# Patient Record
Sex: Female | Born: 1940 | Race: White | Hispanic: No | Smoking: Never smoker
Health system: Southern US, Community
[De-identification: ages and names within clinical notes are randomized; demographics above are authoritative.]

## PROBLEM LIST (undated history)

## (undated) DIAGNOSIS — R351 Nocturia: Secondary | ICD-10-CM

## (undated) DIAGNOSIS — D649 Anemia, unspecified: Secondary | ICD-10-CM

## (undated) DIAGNOSIS — M5136 Other intervertebral disc degeneration, lumbar region: Secondary | ICD-10-CM

## (undated) DIAGNOSIS — R300 Dysuria: Secondary | ICD-10-CM

## (undated) DIAGNOSIS — M549 Dorsalgia, unspecified: Secondary | ICD-10-CM

## (undated) DIAGNOSIS — R296 Repeated falls: Secondary | ICD-10-CM

## (undated) DIAGNOSIS — E119 Type 2 diabetes mellitus without complications: Secondary | ICD-10-CM

## (undated) DIAGNOSIS — M1990 Unspecified osteoarthritis, unspecified site: Secondary | ICD-10-CM

## (undated) DIAGNOSIS — M810 Age-related osteoporosis without current pathological fracture: Secondary | ICD-10-CM

## (undated) DIAGNOSIS — R002 Palpitations: Secondary | ICD-10-CM

## (undated) DIAGNOSIS — E785 Hyperlipidemia, unspecified: Secondary | ICD-10-CM

## (undated) DIAGNOSIS — M47816 Spondylosis without myelopathy or radiculopathy, lumbar region: Secondary | ICD-10-CM

## (undated) DIAGNOSIS — N390 Urinary tract infection, site not specified: Secondary | ICD-10-CM

## (undated) DIAGNOSIS — J189 Pneumonia, unspecified organism: Secondary | ICD-10-CM

## (undated) DIAGNOSIS — G9340 Encephalopathy, unspecified: Secondary | ICD-10-CM

## (undated) DIAGNOSIS — H547 Unspecified visual loss: Secondary | ICD-10-CM

## (undated) DIAGNOSIS — G20 Parkinson's disease: Secondary | ICD-10-CM

## (undated) DIAGNOSIS — I10 Essential (primary) hypertension: Secondary | ICD-10-CM

## (undated) DIAGNOSIS — G8929 Other chronic pain: Secondary | ICD-10-CM

## (undated) DIAGNOSIS — I959 Hypotension, unspecified: Secondary | ICD-10-CM

## (undated) DIAGNOSIS — H3552 Pigmentary retinal dystrophy: Secondary | ICD-10-CM

## (undated) DIAGNOSIS — K219 Gastro-esophageal reflux disease without esophagitis: Secondary | ICD-10-CM

## (undated) DIAGNOSIS — Z8719 Personal history of other diseases of the digestive system: Secondary | ICD-10-CM

## (undated) DIAGNOSIS — G20A1 Parkinson's disease without dyskinesia, without mention of fluctuations: Secondary | ICD-10-CM

## (undated) DIAGNOSIS — H9190 Unspecified hearing loss, unspecified ear: Secondary | ICD-10-CM

## (undated) DIAGNOSIS — F419 Anxiety disorder, unspecified: Secondary | ICD-10-CM

## (undated) DIAGNOSIS — N179 Acute kidney failure, unspecified: Secondary | ICD-10-CM

## (undated) DIAGNOSIS — F0390 Unspecified dementia without behavioral disturbance: Secondary | ICD-10-CM

## (undated) DIAGNOSIS — R52 Pain, unspecified: Secondary | ICD-10-CM

## (undated) HISTORY — DX: Dysuria: R30.0

## (undated) HISTORY — DX: Dorsalgia, unspecified: M54.9

## (undated) HISTORY — DX: Nocturia: R35.1

## (undated) HISTORY — DX: Other chronic pain: G89.29

## (undated) HISTORY — DX: Type 2 diabetes mellitus without complications: E11.9

## (undated) HISTORY — DX: Essential (primary) hypertension: I10

## (undated) HISTORY — DX: Repeated falls: R29.6

## (undated) HISTORY — PX: HIATAL HERNIA REPAIR: SHX195

## (undated) HISTORY — DX: Spondylosis without myelopathy or radiculopathy, lumbar region: M47.816

## (undated) HISTORY — DX: Other intervertebral disc degeneration, lumbar region: M51.36

## (undated) HISTORY — DX: Acute kidney failure, unspecified: N17.9

## (undated) HISTORY — DX: Gastro-esophageal reflux disease without esophagitis: K21.9

## (undated) HISTORY — DX: Encephalopathy, unspecified: G93.40

## (undated) HISTORY — DX: Palpitations: R00.2

## (undated) HISTORY — PX: OTHER SURGICAL HISTORY: SHX169

## (undated) HISTORY — DX: Hypotension, unspecified: I95.9

## (undated) HISTORY — DX: Pneumonia, unspecified organism: J18.9

## (undated) HISTORY — PX: TOE AMPUTATION: SHX809

## (undated) HISTORY — DX: Urinary tract infection, site not specified: N39.0

---

## 2001-07-11 ENCOUNTER — Encounter: Payer: Self-pay | Admitting: Neurosurgery

## 2001-07-14 ENCOUNTER — Encounter: Payer: Self-pay | Admitting: Neurosurgery

## 2001-07-14 ENCOUNTER — Inpatient Hospital Stay (HOSPITAL_COMMUNITY): Admission: RE | Admit: 2001-07-14 | Discharge: 2001-07-15 | Payer: Self-pay | Admitting: Neurosurgery

## 2004-12-14 ENCOUNTER — Ambulatory Visit: Payer: Self-pay | Admitting: Unknown Physician Specialty

## 2005-04-30 ENCOUNTER — Ambulatory Visit: Payer: Self-pay | Admitting: Cardiology

## 2005-11-05 ENCOUNTER — Ambulatory Visit: Payer: Self-pay | Admitting: Unknown Physician Specialty

## 2007-04-19 ENCOUNTER — Ambulatory Visit: Payer: Self-pay | Admitting: Unknown Physician Specialty

## 2007-09-08 ENCOUNTER — Ambulatory Visit: Payer: Self-pay | Admitting: Gastroenterology

## 2008-08-15 ENCOUNTER — Ambulatory Visit: Payer: Self-pay | Admitting: Unknown Physician Specialty

## 2009-09-08 ENCOUNTER — Ambulatory Visit: Payer: Self-pay | Admitting: Unknown Physician Specialty

## 2009-09-18 ENCOUNTER — Ambulatory Visit: Payer: Self-pay | Admitting: Unknown Physician Specialty

## 2010-04-27 ENCOUNTER — Ambulatory Visit: Payer: Self-pay | Admitting: Podiatry

## 2010-05-01 ENCOUNTER — Ambulatory Visit: Payer: Self-pay | Admitting: Podiatry

## 2010-05-04 LAB — PATHOLOGY REPORT

## 2010-10-06 ENCOUNTER — Ambulatory Visit: Payer: Self-pay | Admitting: Unknown Physician Specialty

## 2010-10-13 ENCOUNTER — Ambulatory Visit: Payer: Self-pay | Admitting: Unknown Physician Specialty

## 2010-10-18 ENCOUNTER — Encounter: Payer: Self-pay | Admitting: Unknown Physician Specialty

## 2010-10-29 ENCOUNTER — Ambulatory Visit: Payer: Self-pay | Admitting: Unknown Physician Specialty

## 2010-10-30 ENCOUNTER — Ambulatory Visit: Payer: Self-pay | Admitting: Unknown Physician Specialty

## 2011-04-22 ENCOUNTER — Ambulatory Visit: Payer: Self-pay | Admitting: Podiatry

## 2011-10-22 ENCOUNTER — Ambulatory Visit: Payer: Self-pay | Admitting: Cardiology

## 2012-02-18 ENCOUNTER — Ambulatory Visit: Payer: Self-pay | Admitting: Unknown Physician Specialty

## 2012-03-01 ENCOUNTER — Ambulatory Visit: Payer: Self-pay | Admitting: Unknown Physician Specialty

## 2012-04-20 ENCOUNTER — Ambulatory Visit: Payer: Self-pay | Admitting: Gastroenterology

## 2012-06-20 ENCOUNTER — Emergency Department: Payer: Self-pay | Admitting: *Deleted

## 2012-06-20 LAB — URINALYSIS, COMPLETE
Bacteria: NONE SEEN
Bilirubin,UR: NEGATIVE
Blood: NEGATIVE
Glucose,UR: >=500 mg/dL (ref 0–75)
Ketone: NEGATIVE
Leukocyte Esterase: NEGATIVE
Nitrite: NEGATIVE
Ph: 8 (ref 4.5–8.0)
Protein: NEGATIVE
RBC,UR: 1 /HPF (ref 0–5)
Specific Gravity: 1.009 (ref 1.003–1.030)
Squamous Epithelial: 1
WBC UR: 1 /HPF (ref 0–5)

## 2012-06-20 LAB — BASIC METABOLIC PANEL
Anion Gap: 12 (ref 7–16)
BUN: 11 mg/dL (ref 7–18)
Calcium, Total: 9.4 mg/dL (ref 8.5–10.1)
Chloride: 103 mmol/L (ref 98–107)
Co2: 23 mmol/L (ref 21–32)
Creatinine: 0.86 mg/dL (ref 0.60–1.30)
EGFR (African American): >60
EGFR (Non-African Amer.): >60
Glucose: 194 mg/dL — ABNORMAL HIGH (ref 65–99)
Osmolality: 280 (ref 275–301)
Potassium: 3.6 mmol/L (ref 3.5–5.1)
Sodium: 138 mmol/L (ref 136–145)

## 2012-06-20 LAB — CBC WITH DIFFERENTIAL/PLATELET
Basophil #: 0 10*3/uL (ref 0.0–0.1)
Basophil %: 0.5 %
Eosinophil #: 0 10*3/uL (ref 0.0–0.7)
Eosinophil %: 0.1 %
HCT: 42 % (ref 35.0–47.0)
HGB: 14.1 g/dL (ref 12.0–16.0)
Lymphocyte #: 1.2 10*3/uL (ref 1.0–3.6)
Lymphocyte %: 15.8 %
MCH: 30.3 pg (ref 26.0–34.0)
MCHC: 33.6 g/dL (ref 32.0–36.0)
MCV: 90 fL (ref 80–100)
Monocyte #: 0.3 x10 3/mm (ref 0.2–0.9)
Monocyte %: 4.7 %
Neutrophil #: 5.8 10*3/uL (ref 1.4–6.5)
Neutrophil %: 78.9 %
Platelet: 162 10*3/uL (ref 150–440)
RBC: 4.66 10*6/uL (ref 3.80–5.20)
RDW: 13.1 % (ref 11.5–14.5)
WBC: 7.3 10*3/uL (ref 3.6–11.0)

## 2012-11-06 ENCOUNTER — Emergency Department: Payer: Self-pay | Admitting: Emergency Medicine

## 2012-11-06 LAB — CBC WITH DIFFERENTIAL/PLATELET
Basophil %: 0.9 %
Eosinophil #: 0.1 10*3/uL (ref 0.0–0.7)
Eosinophil %: 1.9 %
HCT: 40.2 % (ref 35.0–47.0)
HGB: 13.5 g/dL (ref 12.0–16.0)
Lymphocyte %: 15.7 %
Monocyte #: 0.6 x10 3/mm (ref 0.2–0.9)
Monocyte %: 8.2 %
Neutrophil #: 5.4 10*3/uL (ref 1.4–6.5)
Neutrophil %: 73.3 %
RBC: 4.29 10*6/uL (ref 3.80–5.20)
RDW: 13.6 % (ref 11.5–14.5)

## 2012-11-06 LAB — COMPREHENSIVE METABOLIC PANEL
Alkaline Phosphatase: 93 U/L (ref 50–136)
BUN: 12 mg/dL (ref 7–18)
Bilirubin,Total: 0.4 mg/dL (ref 0.2–1.0)
Co2: 26 mmol/L (ref 21–32)
Creatinine: 0.85 mg/dL (ref 0.60–1.30)
EGFR (Non-African Amer.): >60
SGOT(AST): 29 U/L (ref 15–37)
Sodium: 142 mmol/L (ref 136–145)
Total Protein: 7.7 g/dL (ref 6.4–8.2)

## 2012-11-06 LAB — CK TOTAL AND CKMB (NOT AT ARMC): CK, Total: 124 U/L (ref 21–215)

## 2012-11-06 LAB — TROPONIN I: Troponin-I: <0.02 ng/mL

## 2013-03-16 ENCOUNTER — Ambulatory Visit: Payer: Self-pay | Admitting: Unknown Physician Specialty

## 2013-08-15 ENCOUNTER — Other Ambulatory Visit: Payer: Self-pay | Admitting: Physical Medicine and Rehabilitation

## 2013-08-15 DIAGNOSIS — M545 Low back pain: Secondary | ICD-10-CM

## 2013-08-27 ENCOUNTER — Emergency Department: Payer: Self-pay | Admitting: Emergency Medicine

## 2013-08-27 LAB — CBC WITH DIFFERENTIAL/PLATELET
Basophil #: 0.1 10*3/uL (ref 0.0–0.1)
Basophil %: 1 %
Eosinophil #: 0.1 10*3/uL (ref 0.0–0.7)
Eosinophil %: 1.4 %
HCT: 40.5 % (ref 35.0–47.0)
HGB: 13.7 g/dL (ref 12.0–16.0)
Lymphocyte #: 1.5 10*3/uL (ref 1.0–3.6)
Lymphocyte %: 26.1 %
MCH: 32 pg (ref 26.0–34.0)
MCHC: 33.7 g/dL (ref 32.0–36.0)
MCV: 95 fL (ref 80–100)
Monocyte #: 0.4 x10 3/mm (ref 0.2–0.9)
Monocyte %: 7.5 %
Neutrophil #: 3.6 10*3/uL (ref 1.4–6.5)
Neutrophil %: 64 %
Platelet: 158 10*3/uL (ref 150–440)
RBC: 4.27 10*6/uL (ref 3.80–5.20)
RDW: 13.5 % (ref 11.5–14.5)
WBC: 5.6 10*3/uL (ref 3.6–11.0)

## 2013-08-27 LAB — URINALYSIS, COMPLETE
Bacteria: NONE SEEN
Blood: NEGATIVE
Glucose,UR: NEGATIVE mg/dL (ref 0–75)
Nitrite: NEGATIVE
Protein: NEGATIVE
RBC,UR: NONE SEEN /HPF (ref 0–5)
Specific Gravity: 1.008 (ref 1.003–1.030)
WBC UR: 3 /HPF (ref 0–5)

## 2013-08-27 LAB — BASIC METABOLIC PANEL
Anion Gap: 3 — ABNORMAL LOW (ref 7–16)
BUN: 14 mg/dL (ref 7–18)
Calcium, Total: 9.6 mg/dL (ref 8.5–10.1)
Chloride: 103 mmol/L (ref 98–107)
Co2: 29 mmol/L (ref 21–32)
Creatinine: 0.87 mg/dL (ref 0.60–1.30)
EGFR (African American): >60
EGFR (Non-African Amer.): >60
Glucose: 134 mg/dL — ABNORMAL HIGH (ref 65–99)
Osmolality: 273 (ref 275–301)
Potassium: 3.9 mmol/L (ref 3.5–5.1)
Sodium: 135 mmol/L — ABNORMAL LOW (ref 136–145)

## 2013-08-29 ENCOUNTER — Other Ambulatory Visit: Payer: Self-pay

## 2014-01-31 DIAGNOSIS — R351 Nocturia: Secondary | ICD-10-CM | POA: Insufficient documentation

## 2014-01-31 HISTORY — DX: Nocturia: R35.1

## 2014-05-23 DIAGNOSIS — R300 Dysuria: Secondary | ICD-10-CM

## 2014-05-23 HISTORY — DX: Dysuria: R30.0

## 2014-07-10 DIAGNOSIS — R002 Palpitations: Secondary | ICD-10-CM

## 2014-07-10 HISTORY — DX: Palpitations: R00.2

## 2014-08-15 DIAGNOSIS — M5116 Intervertebral disc disorders with radiculopathy, lumbar region: Secondary | ICD-10-CM | POA: Insufficient documentation

## 2014-08-15 DIAGNOSIS — M47816 Spondylosis without myelopathy or radiculopathy, lumbar region: Secondary | ICD-10-CM

## 2014-08-15 DIAGNOSIS — M25519 Pain in unspecified shoulder: Secondary | ICD-10-CM | POA: Insufficient documentation

## 2014-08-15 HISTORY — DX: Spondylosis without myelopathy or radiculopathy, lumbar region: M47.816

## 2014-10-15 DIAGNOSIS — M170 Bilateral primary osteoarthritis of knee: Secondary | ICD-10-CM | POA: Insufficient documentation

## 2014-11-28 DIAGNOSIS — G4750 Parasomnia, unspecified: Secondary | ICD-10-CM | POA: Insufficient documentation

## 2014-11-28 DIAGNOSIS — E119 Type 2 diabetes mellitus without complications: Secondary | ICD-10-CM

## 2014-11-28 HISTORY — DX: Type 2 diabetes mellitus without complications: E11.9

## 2015-03-08 ENCOUNTER — Encounter: Payer: Self-pay | Admitting: *Deleted

## 2015-03-08 ENCOUNTER — Emergency Department: Payer: Medicare Other

## 2015-03-08 ENCOUNTER — Inpatient Hospital Stay
Admission: EM | Admit: 2015-03-08 | Discharge: 2015-03-12 | DRG: 872 | Disposition: A | Discharge status: Home Health | Payer: Medicare Other | Attending: Internal Medicine | Admitting: Internal Medicine

## 2015-03-08 DIAGNOSIS — M810 Age-related osteoporosis without current pathological fracture: Secondary | ICD-10-CM | POA: Diagnosis present

## 2015-03-08 DIAGNOSIS — E119 Type 2 diabetes mellitus without complications: Secondary | ICD-10-CM | POA: Diagnosis present

## 2015-03-08 DIAGNOSIS — Z79899 Other long term (current) drug therapy: Secondary | ICD-10-CM

## 2015-03-08 DIAGNOSIS — I959 Hypotension, unspecified: Secondary | ICD-10-CM | POA: Diagnosis present

## 2015-03-08 DIAGNOSIS — G8929 Other chronic pain: Secondary | ICD-10-CM | POA: Diagnosis present

## 2015-03-08 DIAGNOSIS — H548 Legal blindness, as defined in USA: Secondary | ICD-10-CM | POA: Diagnosis present

## 2015-03-08 DIAGNOSIS — A419 Sepsis, unspecified organism: Principal | ICD-10-CM | POA: Diagnosis present

## 2015-03-08 DIAGNOSIS — H3552 Pigmentary retinal dystrophy: Secondary | ICD-10-CM | POA: Diagnosis present

## 2015-03-08 DIAGNOSIS — N390 Urinary tract infection, site not specified: Secondary | ICD-10-CM | POA: Diagnosis present

## 2015-03-08 DIAGNOSIS — Z89422 Acquired absence of other left toe(s): Secondary | ICD-10-CM | POA: Diagnosis not present

## 2015-03-08 DIAGNOSIS — R531 Weakness: Secondary | ICD-10-CM

## 2015-03-08 DIAGNOSIS — M549 Dorsalgia, unspecified: Secondary | ICD-10-CM

## 2015-03-08 DIAGNOSIS — M545 Low back pain: Secondary | ICD-10-CM | POA: Diagnosis present

## 2015-03-08 DIAGNOSIS — I10 Essential (primary) hypertension: Secondary | ICD-10-CM | POA: Diagnosis present

## 2015-03-08 DIAGNOSIS — Z7951 Long term (current) use of inhaled steroids: Secondary | ICD-10-CM | POA: Diagnosis not present

## 2015-03-08 DIAGNOSIS — N179 Acute kidney failure, unspecified: Secondary | ICD-10-CM | POA: Diagnosis present

## 2015-03-08 DIAGNOSIS — M25461 Effusion, right knee: Secondary | ICD-10-CM

## 2015-03-08 DIAGNOSIS — H9190 Unspecified hearing loss, unspecified ear: Secondary | ICD-10-CM | POA: Diagnosis present

## 2015-03-08 DIAGNOSIS — M25561 Pain in right knee: Secondary | ICD-10-CM

## 2015-03-08 HISTORY — DX: Pigmentary retinal dystrophy: H35.52

## 2015-03-08 HISTORY — DX: Unspecified hearing loss, unspecified ear: H91.90

## 2015-03-08 HISTORY — DX: Unspecified visual loss: H54.7

## 2015-03-08 HISTORY — DX: Hypotension, unspecified: I95.9

## 2015-03-08 HISTORY — DX: Type 2 diabetes mellitus without complications: E11.9

## 2015-03-08 HISTORY — DX: Age-related osteoporosis without current pathological fracture: M81.0

## 2015-03-08 HISTORY — DX: Unspecified osteoarthritis, unspecified site: M19.90

## 2015-03-08 HISTORY — DX: Other chronic pain: G89.29

## 2015-03-08 HISTORY — DX: Urinary tract infection, site not specified: N39.0

## 2015-03-08 HISTORY — DX: Pain, unspecified: R52

## 2015-03-08 HISTORY — DX: Essential (primary) hypertension: I10

## 2015-03-08 HISTORY — DX: Dorsalgia, unspecified: M54.9

## 2015-03-08 LAB — COMPREHENSIVE METABOLIC PANEL
ALK PHOS: 43 U/L (ref 38–126)
ALT: 8 U/L — ABNORMAL LOW (ref 14–54)
ALT: 7 U/L — AB (ref 14–54)
ANION GAP: 9 (ref 5–15)
AST: 23 U/L (ref 15–41)
AST: 24 U/L (ref 15–41)
Albumin: 3.6 g/dL (ref 3.5–5.0)
Albumin: 3.1 g/dL — ABNORMAL LOW (ref 3.5–5.0)
Alkaline Phosphatase: 38 U/L (ref 38–126)
Anion gap: 5 (ref 5–15)
BILIRUBIN TOTAL: 0.5 mg/dL (ref 0.3–1.2)
BUN: 32 mg/dL — ABNORMAL HIGH (ref 6–20)
BUN: 26 mg/dL — ABNORMAL HIGH (ref 6–20)
CALCIUM: 8 mg/dL — AB (ref 8.9–10.3)
CHLORIDE: 101 mmol/L (ref 101–111)
CO2: 26 mmol/L (ref 22–32)
CO2: 23 mmol/L (ref 22–32)
Calcium: 8.7 mg/dL — ABNORMAL LOW (ref 8.9–10.3)
Chloride: 111 mmol/L (ref 101–111)
Creatinine, Ser: 2.15 mg/dL — ABNORMAL HIGH (ref 0.44–1.00)
Creatinine, Ser: 1.56 mg/dL — ABNORMAL HIGH (ref 0.44–1.00)
GFR calc Af Amer: 36 mL/min — ABNORMAL LOW (ref >60)
GFR calc non Af Amer: 31 mL/min — ABNORMAL LOW (ref >60)
GFR, EST AFRICAN AMERICAN: 25 mL/min — AB (ref >60)
GFR, EST NON AFRICAN AMERICAN: 21 mL/min — AB (ref >60)
Glucose, Bld: 128 mg/dL — ABNORMAL HIGH (ref 65–99)
Glucose, Bld: 60 mg/dL — ABNORMAL LOW (ref 65–99)
Potassium: 3.9 mmol/L (ref 3.5–5.1)
Potassium: 3.4 mmol/L — ABNORMAL LOW (ref 3.5–5.1)
Sodium: 136 mmol/L (ref 135–145)
Sodium: 139 mmol/L (ref 135–145)
Total Bilirubin: 0.6 mg/dL (ref 0.3–1.2)
Total Protein: 6.2 g/dL — ABNORMAL LOW (ref 6.5–8.1)
Total Protein: 5.5 g/dL — ABNORMAL LOW (ref 6.5–8.1)

## 2015-03-08 LAB — LIPASE, BLOOD: LIPASE: 45 U/L (ref 22–51)

## 2015-03-08 LAB — CBC
HEMATOCRIT: 32.9 % — AB (ref 35.0–47.0)
HEMATOCRIT: 31.8 % — AB (ref 35.0–47.0)
Hemoglobin: 10.8 g/dL — ABNORMAL LOW (ref 12.0–16.0)
Hemoglobin: 10.5 g/dL — ABNORMAL LOW (ref 12.0–16.0)
MCH: 31.6 pg (ref 26.0–34.0)
MCH: 31.8 pg (ref 26.0–34.0)
MCHC: 32.7 g/dL (ref 32.0–36.0)
MCHC: 33 g/dL (ref 32.0–36.0)
MCV: 96.6 fL (ref 80.0–100.0)
MCV: 96.6 fL (ref 80.0–100.0)
PLATELETS: 113 10*3/uL — AB (ref 150–440)
Platelets: 96 10*3/uL — ABNORMAL LOW (ref 150–440)
RBC: 3.41 MIL/uL — ABNORMAL LOW (ref 3.80–5.20)
RBC: 3.29 MIL/uL — ABNORMAL LOW (ref 3.80–5.20)
RDW: 13.1 % (ref 11.5–14.5)
RDW: 13.2 % (ref 11.5–14.5)
WBC: 9.9 10*3/uL (ref 3.6–11.0)
WBC: 7.3 10*3/uL (ref 3.6–11.0)

## 2015-03-08 LAB — URINALYSIS COMPLETE WITH MICROSCOPIC (ARMC ONLY)
BACTERIA UA: NONE SEEN
BILIRUBIN URINE: NEGATIVE
Glucose, UA: NEGATIVE mg/dL
Ketones, ur: NEGATIVE mg/dL
Nitrite: NEGATIVE
PROTEIN: 30 mg/dL — AB
Specific Gravity, Urine: 1.016 (ref 1.005–1.030)
pH: 5 (ref 5.0–8.0)

## 2015-03-08 LAB — TROPONIN I: Troponin I: <0.03 ng/mL (ref <0.031)

## 2015-03-08 LAB — LACTIC ACID, PLASMA
Lactic Acid, Venous: 0.9 mmol/L (ref 0.5–2.0)
Lactic Acid, Venous: 0.7 mmol/L (ref 0.5–2.0)

## 2015-03-08 MED ORDER — HYDROCODONE-ACETAMINOPHEN 5-325 MG PO TABS
1.0000 | ORAL_TABLET | ORAL | Status: DC | PRN
  Administered 2015-03-10 – 2015-03-11 (×4): 1 via ORAL
  Administered 2015-03-12: 2 via ORAL
  Filled 2015-03-08: qty 2
  Filled 2015-03-08 (×4): qty 1

## 2015-03-08 MED ORDER — CALCIUM CARBONATE-VITAMIN D 500-200 MG-UNIT PO TABS
1.0000 | ORAL_TABLET | Freq: Every day | ORAL | Status: DC
Start: 2015-03-08 — End: 2015-03-12
  Administered 2015-03-10 – 2015-03-12 (×3): 1 via ORAL
  Filled 2015-03-08 (×4): qty 1

## 2015-03-08 MED ORDER — CEFTRIAXONE SODIUM IN DEXTROSE 40 MG/ML IV SOLN
2.0000 g | Freq: Once | INTRAVENOUS | Status: AC
  Administered 2015-03-08: 2 g via INTRAVENOUS
  Filled 2015-03-08: qty 50

## 2015-03-08 MED ORDER — DOCUSATE SODIUM 100 MG PO CAPS
100.0000 mg | ORAL_CAPSULE | Freq: Two times a day (BID) | ORAL | Status: DC
  Administered 2015-03-08 – 2015-03-12 (×7): 100 mg via ORAL
  Filled 2015-03-08 (×8): qty 1

## 2015-03-08 MED ORDER — FLUTICASONE PROPIONATE 50 MCG/ACT NA SUSP
2.0000 | Freq: Every day | NASAL | Status: DC
  Administered 2015-03-08 – 2015-03-11 (×4): 2 via NASAL
  Filled 2015-03-08: qty 16

## 2015-03-08 MED ORDER — PANTOPRAZOLE SODIUM 40 MG PO TBEC
40.0000 mg | DELAYED_RELEASE_TABLET | Freq: Two times a day (BID) | ORAL | Status: DC
  Administered 2015-03-09 – 2015-03-12 (×7): 40 mg via ORAL
  Filled 2015-03-08 (×7): qty 1

## 2015-03-08 MED ORDER — ONDANSETRON HCL 4 MG PO TABS: 4.0000 mg | ORAL_TABLET | Freq: Four times a day (QID) | ORAL | Status: DC | PRN

## 2015-03-08 MED ORDER — ACETAMINOPHEN 325 MG PO TABS
650.0000 mg | ORAL_TABLET | Freq: Four times a day (QID) | ORAL | Status: DC | PRN
  Administered 2015-03-09: 650 mg via ORAL
  Filled 2015-03-08: qty 2

## 2015-03-08 MED ORDER — ROSUVASTATIN CALCIUM 10 MG PO TABS
10.0000 mg | ORAL_TABLET | ORAL | Status: DC
  Administered 2015-03-10: 10 mg via ORAL
  Filled 2015-03-08: qty 1

## 2015-03-08 MED ORDER — CIPROFLOXACIN IN D5W 400 MG/200ML IV SOLN
400.0000 mg | Freq: Two times a day (BID) | INTRAVENOUS | Status: DC
  Filled 2015-03-08: qty 200

## 2015-03-08 MED ORDER — GABAPENTIN 300 MG PO CAPS
300.0000 mg | ORAL_CAPSULE | Freq: Three times a day (TID) | ORAL | Status: DC
  Administered 2015-03-08: 300 mg via ORAL
  Filled 2015-03-08 (×2): qty 1

## 2015-03-08 MED ORDER — SODIUM CHLORIDE 0.9 % IV BOLUS (SEPSIS)
1000.0000 mL | Freq: Once | INTRAVENOUS | Status: AC
  Administered 2015-03-08: 1000 mL via INTRAVENOUS

## 2015-03-08 MED ORDER — IBUPROFEN 400 MG PO TABS
800.0000 mg | ORAL_TABLET | Freq: Two times a day (BID) | ORAL | Status: DC
  Administered 2015-03-08 – 2015-03-10 (×3): 800 mg via ORAL
  Filled 2015-03-08 (×8): qty 2

## 2015-03-08 MED ORDER — HEPARIN SODIUM (PORCINE) 5000 UNIT/ML IJ SOLN
5000.0000 [IU] | Freq: Three times a day (TID) | INTRAMUSCULAR | Status: DC
  Administered 2015-03-08 – 2015-03-12 (×11): 5000 [IU] via SUBCUTANEOUS
  Filled 2015-03-08 (×11): qty 1

## 2015-03-08 MED ORDER — CEFTRIAXONE SODIUM IN DEXTROSE 20 MG/ML IV SOLN
1.0000 g | INTRAVENOUS | Status: DC
  Administered 2015-03-09 – 2015-03-12 (×4): 1 g via INTRAVENOUS
  Filled 2015-03-08 (×5): qty 50

## 2015-03-08 MED ORDER — CIPROFLOXACIN IN D5W 400 MG/200ML IV SOLN
400.0000 mg | INTRAVENOUS | Status: AC
  Administered 2015-03-08: 400 mg via INTRAVENOUS

## 2015-03-08 MED ORDER — CEFTRIAXONE SODIUM IN DEXTROSE 20 MG/ML IV SOLN: 1.0000 g | INTRAVENOUS | Status: DC

## 2015-03-08 MED ORDER — LEVOFLOXACIN IN D5W 250 MG/50ML IV SOLN: 250.0000 mg | INTRAVENOUS | Status: DC

## 2015-03-08 MED ORDER — LINAGLIPTIN 5 MG PO TABS
5.0000 mg | ORAL_TABLET | Freq: Every day | ORAL | Status: DC
  Filled 2015-03-08: qty 1

## 2015-03-08 MED ORDER — NORTRIPTYLINE HCL 10 MG PO CAPS
10.0000 mg | ORAL_CAPSULE | Freq: Every day | ORAL | Status: DC
  Administered 2015-03-08 – 2015-03-11 (×4): 10 mg via ORAL
  Filled 2015-03-08 (×4): qty 1

## 2015-03-08 MED ORDER — ADULT MULTIVITAMIN W/MINERALS CH
1.0000 | ORAL_TABLET | Freq: Every day | ORAL | Status: DC
  Administered 2015-03-10 – 2015-03-12 (×3): 1 via ORAL
  Filled 2015-03-08 (×4): qty 1

## 2015-03-08 MED ORDER — CIPROFLOXACIN IN D5W 400 MG/200ML IV SOLN
INTRAVENOUS | Status: AC
  Administered 2015-03-08: 400 mg via INTRAVENOUS
  Filled 2015-03-08: qty 200

## 2015-03-08 MED ORDER — ASPIRIN EC 81 MG PO TBEC
81.0000 mg | DELAYED_RELEASE_TABLET | Freq: Every day | ORAL | Status: DC
  Administered 2015-03-09 – 2015-03-12 (×4): 81 mg via ORAL
  Filled 2015-03-08 (×4): qty 1

## 2015-03-08 MED ORDER — ACETAMINOPHEN 650 MG RE SUPP: 650.0000 mg | Freq: Four times a day (QID) | RECTAL | Status: DC | PRN

## 2015-03-08 MED ORDER — SODIUM CHLORIDE 0.9 % IV SOLN
INTRAVENOUS | Status: DC
  Administered 2015-03-08 – 2015-03-09 (×2): via INTRAVENOUS

## 2015-03-08 MED ORDER — PIOGLITAZONE HCL 15 MG PO TABS: 45.0000 mg | ORAL_TABLET | Freq: Every day | ORAL | Status: DC

## 2015-03-08 MED ORDER — INSULIN ASPART 100 UNIT/ML ~~LOC~~ SOLN
0.0000 [IU] | Freq: Three times a day (TID) | SUBCUTANEOUS | Status: DC
  Administered 2015-03-09: 1 [IU] via SUBCUTANEOUS
  Filled 2015-03-08: qty 1

## 2015-03-08 MED ORDER — METOPROLOL TARTRATE 50 MG PO TABS
75.0000 mg | ORAL_TABLET | Freq: Two times a day (BID) | ORAL | Status: DC
  Administered 2015-03-10 – 2015-03-12 (×5): 75 mg via ORAL
  Filled 2015-03-08 (×6): qty 1

## 2015-03-08 MED ORDER — ONDANSETRON HCL 4 MG/2ML IJ SOLN: 4.0000 mg | Freq: Four times a day (QID) | INTRAMUSCULAR | Status: DC | PRN

## 2015-03-08 MED ORDER — VITAMIN D3 25 MCG (1000 UNIT) PO TABS
2000.0000 [IU] | ORAL_TABLET | Freq: Every day | ORAL | Status: DC
Start: 2015-03-09 — End: 2015-03-12
  Administered 2015-03-09 – 2015-03-12 (×4): 2000 [IU] via ORAL
  Filled 2015-03-08 (×8): qty 2

## 2015-03-08 MED ORDER — CIPROFLOXACIN IN D5W 400 MG/200ML IV SOLN
400.0000 mg | INTRAVENOUS | Status: DC
  Administered 2015-03-09: 400 mg via INTRAVENOUS
  Filled 2015-03-08 (×2): qty 200

## 2015-03-08 MED ORDER — GLIMEPIRIDE 2 MG PO TABS
8.0000 mg | ORAL_TABLET | Freq: Every day | ORAL | Status: DC
Start: 2015-03-09 — End: 2015-03-09
  Filled 2015-03-08: qty 4

## 2015-03-08 NOTE — ED Provider Notes (Signed)
[facility] Emergency Department Provider Note  ____________________________________________  Time seen: Approximately 4:46 PM  I have reviewed the triage vital signs and the nursing notes.   HISTORY  Chief Complaint Hypotension    HPI Carrie Glenn is a 74 y.o. female who presents with weakness. The patient states last night she felt very tired and wasn't ambulatory get out of bed. This morning, she notes that she was unable to stand because she feels so weak. She denies being in pain, except for her chronic low back pain which has not changed. She does take Vicodin for this and reports she has taken 3 in the last day, and that she usually takes about 2-3 in a day. She denies taking any extra or overdose.  She has not had any weakness in one arm or leg. She has not had a headache. She denies any droop of her face or vision change. She just reports feeling very very tired. She did take her normal blood pressure medicine this morning.  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 With EMS, her initial blood pressure was less than 90 systolic. Her blood pressures improved slightly after 300 mL fluid bolus. She is a diabetic.  She is not a chest pain. No trouble breathing. No cough. She denies abdominal pain.   Past Medical History  Diagnosis Date  . Hypertension   . Diabetes mellitus without complication   . Pain     Chronic back  . Blind     Legally  . Hard of hearing   . Retinitis pigmentosa of both eyes   . Arthritis   . Osteoporosis     Diabetes Hypertension  There are no active problems to display for this patient.   Past Surgical History  Procedure Laterality Date  . Hiatal hernia repair    . Left foot surgery  Left     Hammer toe fixation-2nd toe  . Toe amputation Left     Left 4th toe, partial amputation due to wound not healing    No current outpatient prescriptions on file.  Allergies Amlodipine; Erythromycin;  Lexapro; Lipitor; Macrodantin; Penicillins; Sertraline hcl; Trazodone and nefazodone; Effexor; and Metformin and related  No family history on file.  Social History History  Substance Use Topics  . Smoking status: Never Smoker   . Smokeless tobacco: Not on file  . Alcohol Use: No   Does not smoke, does not drink, does not use any drugs  Review of Systems Constitutional: No fever/chills Eyes: No visual changes , she notes she is basically blind at baseline. ENT: No sore throat. Cardiovascular: Denies chest pain. Respiratory: Denies shortness of breath. Gastrointestinal: No abdominal pain.  No nausea, no vomiting.  No diarrhea.  No constipation. Genitourinary: Negative for dysuria. Musculoskeletal: She did have back pain this morning, which is normal for her. She took her normal Vicodin and states this has helped. She is currently denying having any new pain, but does have very mild chronic low back aching at this time. Skin: Negative for rash. Neurological: Negative for headaches, focal weakness or numbness.  She denies wanting to hurt herself or anyone.  10-point ROS otherwise negative.  ____________________________________________   PHYSICAL EXAM:  VITAL SIGNS: ED Triage Vitals  Enc Vitals Group     BP --      Pulse --      Resp --      Temp --      Temp src --  SpO2 --      Weight --      Height --      Head Cir --      Peak Flow --      Pain Score --      Pain Loc --      Pain Edu? --      Excl. in GC? --     Constitutional: Alert and oriented. Fatigued appearing. Eyes: Conjunctivae are normal. PERRL. EOMI grossly, the patient does have difficulty tracking because of very poor vision which is at baseline per patient. Head: Atraumatic. Nose: No congestion/rhinnorhea. Mouth/Throat: Mucous membranes are moist.  Oropharynx non-erythematous. Neck: No stridor.   Cardiovascular: Normal rate, regular rhythm. Grossly normal heart sounds.  She has poor  peripheral circulation, and is a little cool in the distal extremities. She doesn't normal capillary refill. She has no pulse deficits in any extremity. Respiratory: Normal respiratory effort.  No retractions. Lungs CTAB. Gastrointestinal: Soft and nontender. No distention. No abdominal bruits. No CVA tenderness. Musculoskeletal: No lower extremity tenderness nor edema.  No joint effusions. Neurologic:  Normal speech and language. No gross focal neurologic deficits are appreciated. Speech is normal. No gait instability. She has 4 out of 5 strength in all extremities. She appears diffusely weak, without focality. Skin:  Skin is warm, dry and intact. No rash noted. Psychiatric: Mood and affect are normal. Speech and behavior are normal.  ____________________________________________   LABS (all labs ordered are listed, but only abnormal results are displayed)  Labs Reviewed  CBC - Abnormal; Notable for the following:    RBC 3.41 (*)    Hemoglobin 10.8 (*)    HCT 32.9 (*)    Platelets 113 (*)    All other components within normal limits  COMPREHENSIVE METABOLIC PANEL - Abnormal; Notable for the following:    Glucose, Bld 128 (*)    BUN 32 (*)    Creatinine, Ser 2.15 (*)    Calcium 8.7 (*)    Total Protein 6.2 (*)    ALT 8 (*)    GFR calc non Af Amer 21 (*)    GFR calc Af Amer 25 (*)    All other components within normal limits  URINALYSIS COMPLETEWITH MICROSCOPIC ([facility] ONLY) - Abnormal; Notable for the following:    Color, Urine YELLOW (*)    APPearance CLOUDY (*)    Hgb urine dipstick 1+ (*)    Protein, ur 30 (*)    Leukocytes, UA 3+ (*)    Squamous Epithelial / LPF 0-5 (*)    All other components within normal limits  CULTURE, BLOOD (ROUTINE X 2)  CULTURE, BLOOD (ROUTINE X 2)  LACTIC ACID, PLASMA  TROPONIN I  LIPASE, BLOOD  LACTIC ACID, PLASMA   ____________________________________________  EKG   Date: 03/08/2015  Rate: 60  Rhythm: normal sinus rhythm  QRS Axis:  normal  Intervals: normal  ST/T Wave abnormalities: normal  Conduction Disutrbances: none  Narrative Interpretation: No acute ischemic change.     ____________________________________________  RADIOLOGY CLINICAL DATA: Hypotension. History of hypertension, diabetes, previous partial foot amputation. Initial encounter.  EXAM: PORTABLE CHEST - 1 VIEW  COMPARISON: Chest CT 11/06/2012.  FINDINGS: 1745 hours. The heart size and mediastinal contours are stable. Patient has a known large hiatal hernia. There is increased vascular congestion with probable mild edema and a probable small left pleural effusion. There is no confluent airspace opacity. The bones appear unchanged. Telemetry leads overlie the chest.  IMPRESSION: Cardiomegaly with increased  vascular congestion and probable mild edema.  ____________________________________________   PROCEDURES  Procedure(s) performed: None  Critical Care performed: Yes, see critical care note(s)   CRITICAL CARE Performed by: Sharyn Creamer   Total critical care time: 35  Critical care time was exclusive of separately billable procedures and treating other patients.  Critical care was necessary to treat or prevent imminent or life-threatening deterioration.  Critical care was time spent personally by me on the following activities: development of treatment plan with patient and/or surrogate as well as nursing, discussions with consultants, evaluation of patient's response to treatment, examination of patient, obtaining history from patient or surrogate, ordering and performing treatments and interventions, ordering and review of laboratory studies, ordering and review of radiographic studies, pulse oximetry and re-evaluation of patient's condition.  Patient has urinary tract infection as well as hypotension with a systolic blood pressure less than 80. She required emergent evaluation, fluid resuscitation, early antibiotic's, and  initiation of early goal directed therapy for suspect sepsis.  ____________________________________________   INITIAL IMPRESSION / ASSESSMENT AND PLAN / ED COURSE  Pertinent labs & imaging results that were available during my care of the patient were reviewed by me and considered in my medical decision making (see chart for details).  Patient presents with fatigue starting last night. Does not appear to be any one symptom that we can easily put a finger on other than noting that she is hypotensive. She does appear to be responding to fluid bolus after initiation by EMS. She is awake and alert and in no distress, but very fatigued. Differential diagnosis for her weakness and hypotension is quite broad though primary concerns would be evaluating for infectious, metabolic, acute cardiac, or other etiologies. She does not have any symptoms to suggest focal neurologic change or stroke.  The patient did take 3 Vicodin during the course of the morning, but appears that this is normal for her. Does not represent a change in her medication. She is alert, breathing normally, and shows no evidence of overdose. Pupils are normal and reactive.  ----------------------------------------- 5:44 PM on 03/08/2015 -----------------------------------------  Patient hasn't had approximately 800 mL fluid bolus, she remains moderately hypotensive at this time. She did briefly have a blood pressure less than 80 systolic. In the setting of urinary tract infection I believe the patient to be suffering from significant sepsis. I have ordered antibiotic's, blood cultures, we will initiate additional fluid resuscitation, await labs and monitor her closely. He is awake and alert any discuss her daughter. Gives a history that the patient feels she's been having urinary symptoms, with some burning with urination for the last few days which patient had not initially recalled. She has a upcoming appointment with her primary because  she was concerned about a "urinary tract infection".  Then, the patient is in no distress and mentating well. I wait remaining labs, and we will give an additional fluid bolus now. Discussed with the nurse. ____________________________________________  ----------------------------------------- 6:25 PM on 03/08/2015 -----------------------------------------  Patient's blood pressures improved after fluids. She has acute kidney injury as well as UTI. Speckled patient is likely quite dehydrated. She does have some questionable early CHF on chest x-ray, but given her hypotension I feel the benefits of fluid resuscitation always the risks.  Discussed and admitting to Dr. Aletha Halim of the hospitalist service.  FINAL CLINICAL IMPRESSION(S) / ED DIAGNOSES  Final diagnoses:  Sepsis, due to unspecified organism  Acute renal failure, unspecified acute renal failure type  Acute urinary tract infection  Sharyn Creamer, MD 03/08/15 [phone]

## 2015-03-08 NOTE — ED Notes (Signed)
Per EMS report, patient's family called EMS for the patient being sleepy. Per family report, patient took 3 Vicodin 7.5mg  between 10:30 and 14:30 today for chronic back pain. EMS report patient being hypotensive upon their arrival at 82/45, alert and drowsy. Patient able to answer simple questions and follow commands upon arrival.

## 2015-03-08 NOTE — ED Notes (Signed)
Patient assisted w/ 2 persons to pivot to bedside commode per family and patient's request. Patient could not tolerate the bedpan.

## 2015-03-08 NOTE — H&P (Signed)
History and Physical    Carrie Glenn JYN:[id number] DOB: [birth year] DOA: 03/08/2015  Referring physician: Dr. Jacqualine Code PCP: Glendon Axe, MD  Specialists: none  Chief Complaint: hypotension  HPI: Carrie Glenn is a 74 y.o. female has a past medical history significant for DM and HTN with chronic back pain now with progressive weakness and back pain found to be hypotensive and lethargic in ER with UTI. Pt is legally blind and has severe hearing loss. Family is present.  Review of Systems: The patient denies, fever, weight loss, hoarseness, chest pain, syncope, dyspnea on exertion, peripheral edema, balance deficits, hemoptysis, abdominal pain, melena, hematochezia, severe indigestion/heartburn, hematuria, incontinence, genital sores, muscle weakness, suspicious skin lesions, transient blindness, difficulty walking, depression, unusual weight change, abnormal bleeding, enlarged lymph nodes, angioedema, and breast masses.   Past Medical History  Diagnosis Date  . Hypertension   . Diabetes mellitus without complication   . Pain     Chronic back  . Blind     Legally  . Hard of hearing   . Retinitis pigmentosa of both eyes   . Arthritis   . Osteoporosis    Past Surgical History  Procedure Laterality Date  . Hiatal hernia repair    . Left foot surgery  Left     Hammer toe fixation-2nd toe  . 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 She does not have any smokeless tobacco history on file. She reports that she does not drink alcohol. Her drug history is not on file.  Allergies  Allergen Reactions  . Amlodipine Swelling  . Erythromycin Nausea Only  . Lexapro [Escitalopram Oxalate] Nausea Only  . Lipitor [Atorvastatin] Nausea And Vomiting  . Macrodantin [Nitrofurantoin Macrocrystal] Nausea Only  . Penicillins   . Sertraline Hcl Other (See Comments)    Patients gets jittery.  . Trazodone  And Nefazodone Other (See Comments)    Patient gets insomnia.  . Amoxicillin Diarrhea, Nausea Only, Rash and Nausea And Vomiting  . Azithromycin Rash  . Effexor [Venlafaxine] Rash  . Metformin And Related Itching and Rash    History reviewed. No pertinent family history.  Prior to Admission medications   Medication Sig Start Date End Date Taking? Authorizing Provider  Blood Glucose Monitoring Suppl (ACCU-CHEK COMPACT CARE KIT) KIT 1 kit by Other route See admin instructions.   Yes Historical Provider, MD  Calcium Carbonate-Vitamin D 600-400 MG-UNIT per tablet Take 1 tablet by mouth daily.   Yes Historical Provider, MD  Cholecalciferol (VITAMIN D3) 2000 UNITS capsule Take 2,000 Units by mouth daily.   Yes Historical Provider, MD  fluticasone (FLONASE) 50 MCG/ACT nasal spray Place 2 sprays into the nose at bedtime as needed. 11/28/14  Yes Historical Provider, MD  gabapentin (NEURONTIN) 300 MG capsule Take 300 mg by mouth 3 (three) times daily.   Yes Historical Provider, MD  glimepiride (AMARYL) 4 MG tablet Take 8 mg by mouth daily.   Yes Historical Provider, MD  glucose blood test strip 1 each 2 (two) times daily.   Yes Historical Provider, MD  HYDROcodone-acetaminophen (NORCO) 7.5-325 MG per tablet Take 1 tablet by mouth 3 (three) times daily as needed for moderate pain or severe pain.  05/06/15  Yes Historical Provider, MD  ibuprofen (ADVIL,MOTRIN) 800 MG tablet Take 800 mg by mouth 2 (two) times daily.   Yes Historical Provider, MD  isosorbide mononitrate (IMDUR) 30  MG 24 hr tablet Take 30 mg by mouth every morning. 08/01/14  Yes Historical Provider, MD  metoprolol (LOPRESSOR) 50 MG tablet Take 75 mg by mouth 2 (two) times daily.   Yes Historical Provider, MD  Multiple Vitamins-Minerals (CENTRUM SILVER) tablet Take 1 tablet by mouth daily.   Yes Historical Provider, MD  nortriptyline (PAMELOR) 10 MG capsule Take 10 mg by mouth at bedtime.   Yes Historical Provider, MD  olmesartan (BENICAR) 20 MG  tablet Take 20 mg by mouth 2 (two) times daily.   Yes Historical Provider, MD  omeprazole (PRILOSEC OTC) 20 MG tablet Take 20 mg by mouth 2 (two) times daily as needed.   Yes Historical Provider, MD  pioglitazone (ACTOS) 45 MG tablet Take 45 mg by mouth daily. 08/01/14  Yes Historical Provider, MD  rosuvastatin (CRESTOR) 10 MG tablet Take 10 mg by mouth 3 (three) times a week. Take on Monday, Wednesday, and Friday.   Yes Historical Provider, MD  sitaGLIPtin (JANUVIA) 50 MG tablet Take 50 mg by mouth daily. 08/01/14  Yes Historical Provider, MD   Physical Exam: Filed Vitals:   03/08/15 1735 03/08/15 1800 03/08/15 1830 03/08/15 1851  BP: 88/51 107/59 101/50 106/53  Pulse: 61 66 63 69  Temp:      TempSrc:      Resp: 12 10 13 12   Weight:      SpO2: 98% 98% 99% 100%     General:  No apparent distress  Eyes: PERRL, EOMI, no scleral icterus  ENT: moist oropharynx  Neck: supple, no lymphadenopathy  Cardiovascular: regular rate without MRG; 2+ peripheral pulses, no JVD, no peripheral edema  Respiratory: CTA biL, good air movement without wheezing, rhonchi or crackled  Abdomen: soft, non tender to palpation, positive bowel sounds, no guarding, no rebound  Skin: no rashes  Musculoskeletal: normal bulk and tone, no joint swelling  Psychiatric: normal mood and affect  Neurologic: CN 2-12 grossly intact, MS 5/5 in all 4  Labs on Admission:  Basic Metabolic Panel:  Recent Labs Lab 03/08/15 1705  NA 136  K 3.9  CL 101  CO2 26  GLUCOSE 128*  BUN 32*  CREATININE 2.15*  CALCIUM 8.7*   Liver Function Tests:  Recent Labs Lab 03/08/15 1705  AST 23  ALT 8*  ALKPHOS 43  BILITOT 0.6  PROT 6.2*  ALBUMIN 3.6    Recent Labs Lab 03/08/15 1705  LIPASE 45   No results for input(s): AMMONIA in the last 168 hours. CBC:  Recent Labs Lab 03/08/15 1705  WBC 9.9  HGB 10.8*  HCT 32.9*  MCV 96.6  PLT 113*   Cardiac Enzymes:  Recent Labs Lab 03/08/15 1705  TROPONINI  <0.03    BNP (last 3 results) No results for input(s): BNP in the last 8760 hours.  ProBNP (last 3 results) No results for input(s): PROBNP in the last 8760 hours.  CBG: No results for input(s): GLUCAP in the last 168 hours.  Radiological Exams on Admission: Dg Chest Port 1 View  03/08/2015   CLINICAL DATA:  Hypotension. History of hypertension, diabetes, previous partial foot amputation. Initial encounter.  EXAM: PORTABLE CHEST - 1 VIEW  COMPARISON:  Chest CT 11/06/2012.  FINDINGS: 1745 hours. The heart size and mediastinal contours are stable. Patient has a known large hiatal hernia. There is increased vascular congestion with probable mild edema and a probable small left pleural effusion. There is no confluent airspace opacity. The bones appear unchanged. Telemetry leads overlie the chest.  IMPRESSION: Cardiomegaly with increased vascular congestion and probable mild edema.   Electronically Signed   By: Richardean Sale M.D.   On: 03/08/2015 18:03    EKG: Independently reviewed.  Assessment/Plan Principal Problem:   Sepsis Active Problems:   Acute UTI   Hypotension   Weakness generalized   Chronic back pain   Will admit to floor with IV fluids and IV ABX. Cultures sent. Follow sugars. PT and CSW consults.  Diet: Clear liquids Fluids: NS@100  DVT Prophylaxis: SQ Heparin  Code Status: FULL  Family Communication: yes  Disposition Plan: SNF  Time spent: 50 min

## 2015-03-09 DIAGNOSIS — N179 Acute kidney failure, unspecified: Secondary | ICD-10-CM | POA: Diagnosis present

## 2015-03-09 HISTORY — DX: Acute kidney failure, unspecified: N17.9

## 2015-03-09 LAB — GLUCOSE, CAPILLARY
GLUCOSE-CAPILLARY: 62 mg/dL — AB (ref 65–99)
GLUCOSE-CAPILLARY: 89 mg/dL (ref 65–99)
Glucose-Capillary: 130 mg/dL — ABNORMAL HIGH (ref 65–99)
Glucose-Capillary: 41 mg/dL — CL (ref 65–99)
Glucose-Capillary: 56 mg/dL — ABNORMAL LOW (ref 65–99)
Glucose-Capillary: 61 mg/dL — ABNORMAL LOW (ref 65–99)
Glucose-Capillary: 78 mg/dL (ref 65–99)
Glucose-Capillary: 95 mg/dL (ref 65–99)

## 2015-03-09 MED ORDER — GABAPENTIN 300 MG PO CAPS
300.0000 mg | ORAL_CAPSULE | Freq: Every day | ORAL | Status: DC
  Administered 2015-03-09 – 2015-03-11 (×3): 300 mg via ORAL
  Filled 2015-03-09 (×3): qty 1

## 2015-03-09 MED ORDER — DEXTROSE 50 % IV SOLN
25.0000 mL | INTRAVENOUS | Status: AC
  Administered 2015-03-09: 25 mL via INTRAVENOUS

## 2015-03-09 MED ORDER — POTASSIUM CHLORIDE 20 MEQ PO PACK
20.0000 meq | PACK | Freq: Once | ORAL | Status: AC
  Administered 2015-03-09: 20 meq via ORAL
  Filled 2015-03-09: qty 1

## 2015-03-09 MED ORDER — ALPRAZOLAM ER 0.5 MG PO TB24
0.5000 mg | ORAL_TABLET | Freq: Every day | ORAL | Status: DC
  Administered 2015-03-09 – 2015-03-12 (×4): 0.5 mg via ORAL
  Filled 2015-03-09 (×4): qty 1

## 2015-03-09 MED ORDER — POTASSIUM CHLORIDE IN NACL 20-0.9 MEQ/L-% IV SOLN
INTRAVENOUS | Status: DC
  Administered 2015-03-09 – 2015-03-11 (×5): via INTRAVENOUS
  Filled 2015-03-09 (×8): qty 1000

## 2015-03-09 MED ORDER — DEXTROSE 50 % IV SOLN
INTRAVENOUS | Status: AC
  Administered 2015-03-09: 25 mL via INTRAVENOUS
  Filled 2015-03-09: qty 50

## 2015-03-09 NOTE — Progress Notes (Signed)
Patient's daughter is concerned with medications. Dr. Judithann Sheen notified Neurontin is ordered 3 times a day, patient only takes once, xanax xr is not ordered that patient takes at home, Benicar and [place] is not ordered. Neurontin changed to once daily, xanax xr ordered once daily, and patients daughter was asked to bring in the Benicar and Vesicare since Dr. Judithann Sheen states that pharmacy does not carry those. Family notified of changes and accepting.

## 2015-03-09 NOTE — Progress Notes (Signed)
PT Cancellation Note  Patient Details Name: Carrie Glenn MRN: [id number] DOB: 1941/08/26   Cancelled Treatment:    Reason Eval/Treat Not Completed: Patient declined, no reason specified  Family in room; agrees to hold PT until tomorrow morning.    Katherina Right Clance Baquero, PT 03/09/2015, 10:35 AM

## 2015-03-09 NOTE — Progress Notes (Addendum)
Carrie Glenn is a 74 y.o. female  Sepsis   SUBJECTIVE:  Pt more alert this AM. BP and renal fxn improved. K+ low. Cultures pending. Family concerned regarding possible OSA. No N/V noted.  ______________________________________________________________________  ROS: Review of systems is unremarkable for any active cardiac,respiratory, GI, GU, hematologic, neurologic or psychiatric systems, 10 systems reviewed.  @CMEDLIST @  Past Medical History  Diagnosis Date  . Hypertension   . Diabetes mellitus without complication   . Pain     Chronic back  . Blind     Legally  . Hard of hearing   . Retinitis pigmentosa of both eyes   . Arthritis   . Osteoporosis     Past Surgical History  Procedure Laterality Date  . Hiatal hernia repair    . Left foot surgery  Left     Hammer toe fixation-2nd toe  . Toe amputation Left     Left 4th toe, partial amputation due to wound not healing    PHYSICAL EXAM:  BP 128/62 mmHg  Pulse 61  Temp(Src) 97.5 F (36.4 C) (Oral)  Resp 18  Ht 5\' 3"  (1.6 m)  Wt 76.204 kg (168 lb)  BMI 29.77 kg/m2  SpO2 98%  Wt Readings from Last 3 Encounters:  03/09/15 76.204 kg (168 lb)            Constitutional: NAD Neck: supple, no thyromegaly Respiratory: CTA, no rales or wheezes Cardiovascular: RRR, 1/6 murmur noted, no gallop Abdomen: soft, good BS, nontender Extremities: no edema Neuro: alert and oriented, no focal motor or sensory deficits  ASSESSMENT/PLAN:  Labs and imaging studies were reviewed  Will continue IV fluids and IV ABX. F/u on cultures. Supplement K+ today in IV fluids. Overnight oximetry on RA tonight to assess possible OSA. Repeat labs in AM. PT and CSW consults pending.  Please note dramatic improvement of renal function which suggests that the pt had acute renal failure on admission which has now improved.

## 2015-03-09 NOTE — Progress Notes (Signed)
Dr sparks called, patient blood sugar keeps dropping, ordered 1/2 amp of d50. Will recheck sugar afterwards and continue to monitor.

## 2015-03-10 LAB — GLUCOSE, CAPILLARY
GLUCOSE-CAPILLARY: 78 mg/dL (ref 65–99)
Glucose-Capillary: 82 mg/dL (ref 65–99)
Glucose-Capillary: 122 mg/dL — ABNORMAL HIGH (ref 65–99)
Glucose-Capillary: 117 mg/dL — ABNORMAL HIGH (ref 65–99)

## 2015-03-10 LAB — CBC WITH DIFFERENTIAL/PLATELET
Basophils Absolute: 0 10*3/uL (ref 0–0.1)
Basophils Relative: 1 %
EOS PCT: 3 %
Eosinophils Absolute: 0.2 10*3/uL (ref 0–0.7)
HCT: 32 % — ABNORMAL LOW (ref 35.0–47.0)
Hemoglobin: 10.7 g/dL — ABNORMAL LOW (ref 12.0–16.0)
LYMPHS ABS: 1.6 10*3/uL (ref 1.0–3.6)
LYMPHS PCT: 28 %
MCH: 32.1 pg (ref 26.0–34.0)
MCHC: 33.5 g/dL (ref 32.0–36.0)
MCV: 95.8 fL (ref 80.0–100.0)
MONOS PCT: 12 %
Monocytes Absolute: 0.7 10*3/uL (ref 0.2–0.9)
NEUTROS PCT: 56 %
Neutro Abs: 3.4 10*3/uL (ref 1.4–6.5)
Platelets: 101 10*3/uL — ABNORMAL LOW (ref 150–440)
RBC: 3.34 MIL/uL — AB (ref 3.80–5.20)
RDW: 13.4 % (ref 11.5–14.5)
WBC: 5.9 10*3/uL (ref 3.6–11.0)

## 2015-03-10 LAB — BASIC METABOLIC PANEL
Anion gap: 5 (ref 5–15)
BUN: 13 mg/dL (ref 6–20)
CALCIUM: 8.4 mg/dL — AB (ref 8.9–10.3)
CO2: 23 mmol/L (ref 22–32)
Chloride: 114 mmol/L — ABNORMAL HIGH (ref 101–111)
Creatinine, Ser: 1.1 mg/dL — ABNORMAL HIGH (ref 0.44–1.00)
GFR, EST AFRICAN AMERICAN: 55 mL/min — AB (ref >60)
GFR, EST NON AFRICAN AMERICAN: 48 mL/min — AB (ref >60)
Glucose, Bld: 82 mg/dL (ref 65–99)
POTASSIUM: 3.8 mmol/L (ref 3.5–5.1)
SODIUM: 142 mmol/L (ref 135–145)

## 2015-03-10 MED ORDER — CIPROFLOXACIN HCL 500 MG PO TABS
500.0000 mg | ORAL_TABLET | Freq: Two times a day (BID) | ORAL | Status: DC
  Administered 2015-03-10 – 2015-03-11 (×2): 500 mg via ORAL
  Filled 2015-03-10 (×2): qty 1

## 2015-03-10 MED ORDER — CIPROFLOXACIN HCL 500 MG PO TABS: 500.0000 mg | ORAL_TABLET | ORAL | Status: DC

## 2015-03-10 NOTE — Progress Notes (Signed)
Cont on IV antibiotics. Assist to [facility]. Continuous pulse ox on during night. Slept between care. Pain controlled w/ scheduled and PRN meds. No acute distress, will cont to monitor.

## 2015-03-10 NOTE — Progress Notes (Signed)
Carrie Glenn is a 74 y.o. female  Sepsis   SUBJECTIVE:  Patient is feeling better today. No c/o dizziness. Mildly elevated BP in AM. Blood sugar 82. Family member at bedside.   ______________________________________________________________________  ROS: Review of systems is unremarkable for any active cardiac,respiratory, GI, GU, hematologic, neurologic or psychiatric systems, 10 systems reviewed.  @CMEDLIST @  Past Medical History  Diagnosis Date  . Hypertension   . Diabetes mellitus without complication   . Pain     Chronic back  . Blind     Legally  . Hard of hearing   . Retinitis pigmentosa of both eyes   . Arthritis   . Osteoporosis     Past Surgical History  Procedure Laterality Date  . Hiatal hernia repair    . Left foot surgery  Left     Hammer toe fixation-2nd toe  . Toe amputation Left     Left 4th toe, partial amputation due to wound not healing    PHYSICAL EXAM:  BP 155/79 mmHg  Pulse 80  Temp(Src) 97.8 F (36.6 C) (Oral)  Resp 16  Ht 5\' 3"  (1.6 m)  Wt 78.472 kg (173 lb)  BMI 30.65 kg/m2  SpO2 98%  Wt Readings from Last 3 Encounters:  03/10/15 78.472 kg (173 lb)            Constitutional: NAD Neck: supple, no thyromegaly Respiratory: CTA, no rales or wheezes Cardiovascular: RRR, no tachycardia Abdomen: soft, good BS, nontender Extremities: no edema Neuro: alert and oriented x3  ASSESSMENT/PLAN:  Labs and imaging studies were reviewed  UTI: preliminary blood and urine cultures are negative. Shall continue IV ABX. F/u cultures.  Hypotension on admission. Acute renal failure: Cr improved to 1.1 with IV hydration.  Hypertension: resume Metoprolol, monitor BP.  Borderline low blood sugars: shall modify regular insulin coverage.  Overnight oximetry noted, O2 sat 98-99% on room air. Continue physical therapy.  Discharge planning for possible discharge in AM.

## 2015-03-10 NOTE — Progress Notes (Signed)
Inpatient Diabetes Program Recommendations  AACE/ADA: New Consensus Statement on Inpatient Glycemic Control (2013)  Target Ranges:  Prepandial:   less than 140 mg/dL      Peak postprandial:   less than 180 mg/dL (1-2 hours)      Critically ill patients:  140 - 180 mg/dL   Results for Carrie Glenn, Carrie Glenn (MRN [id number]) as of 03/10/2015 12:35  Ref. Range 03/09/2015 08:06 03/09/2015 08:41 03/09/2015 09:51 03/09/2015 11:08 03/09/2015 14:54 03/09/2015 15:45  Glucose-Capillary Latest Ref Range: 65-99 mg/dL 41 (LL) 62 (L) 95 182 (H) 56 (L) 78   Note hypoglycemia.  Diabetes history: Type 2 diabetes Outpatient Diabetes medications: Actos 45 mg daily, Januvia 50 mg daily, and Amaryl 8 mg daily  Note low blood sugars.  May consider holding Novolog correction while patient is in the hospital.  Also may need decrease in home dose of Amaryl due to hypoglycemia on admission.  Thanks, Beryl Meager, RN, BC-ADM Inpatient Diabetes Coordinator Pager [phone] (8a-5p)

## 2015-03-10 NOTE — Progress Notes (Signed)
PHARMACIST - PHYSICIAN COMMUNICATION DR:   Thedore Mins CONCERNING: Antibiotic IV to Oral Route Change Policy  RECOMMENDATION: This patient is receiving cipro by the intravenous route.  Based on criteria approved by the Pharmacy and Therapeutics Committee, the antibiotic(s) is/are being converted to the equivalent oral dose form(s).   DESCRIPTION: These criteria include:  Patient being treated for a respiratory tract infection, urinary tract infection, cellulitis or clostridium difficile associated diarrhea if on metronidazole  The patient is not neutropenic and does not exhibit a GI malabsorption state  The patient is eating (either orally or via tube) and/or has been taking other orally administered medications for a least 24 hours  The patient is improving clinically and has a Tmax < 100.5  If you have questions about this conversion, please contact the Pharmacy Department  []   [phone] )  [facility] [x]   [phone] )  [facility] []   [phone] )  [facility] []   [phone] )  [facility] []   [phone] )  [facility]

## 2015-03-10 NOTE — Clinical Social Work Note (Signed)
Clinical Social Work Assessment  Patient Details  Name: Carrie Glenn MRN: [id number] Date of Birth: 10/29/1940  Date of referral:  03/10/15               Reason for consult:  Facility Placement                Permission sought to share information with:  Family Supports Permission granted to share information::  Yes, Verbal Permission Granted  Name::        Agency::     Relationship::     Contact Information:     Housing/Transportation Living arrangements for the past 2 months:   (home) Source of Information:  Patient, Adult Children Patient Interpreter Needed:  None Criminal Activity/Legal Involvement Pertinent to Current Situation/Hospitalization:  No - Comment as needed Significant Relationships:  Adult Children, Siblings Lives with:    Do you feel safe going back to the place where you live?  Yes Need for family participation in patient care:  Yes (Comment)  Care giving concerns:  PT recommending 24/7 care or SNF   Social Worker assessment / plan:  CSW met with patient who was initially alone this morning. RN CM had already met with patient and her daughter: Teri earlier today. Physical therapy informed CSW that they are recommending STR but state she would probably do better at home due to her eyesight. Physical therapy stated that they would recommend 24/7 care if she returns home.   CSW spoke with patient who stated that due to her being unsteady and her daughter having to work, she would be open to short term rehab but she would prefer to return home. Patient stated if she has to go to rehab, she would prefer [facility]. As CSW was speaking with patient, patient's daughter, Teri, arrived and stated that she will go ahead and take patient home because she has worked out between herself, her sister, and patient's sister for them to rotate staying with her. CSW spoke with her regarding if she felt she was taking on too much by doing this and she stated that she does not. She states  that she is able to take a leave of absence from her job at [employer] and can return at any time. She states that she does want home health through [facility] and that she would like them to be able to come out as often as possible but understands they do not stay for long periods of time. Patient and daughter are in agreement with return home at this time.  Employment status:  Retired Insurance information:  Medicare PT Recommendations:  Skilled Nursing Facility Information / Referral to community resources:     Patient/Family's Response to care:  Patient is adaptable to what is needed and recommended.  Patient/Family's Understanding of and Emotional Response to Diagnosis, Current Treatment, and Prognosis:  Patient is happy about being able to return home. Patient's daughter is tearful and emotional today but does add that it is due to lack of sleep and circumstances. Patient's daughter states she is at peace with this decision to take her mother home.   Emotional Assessment Appearance:  Appears stated age Attitude/Demeanor/Rapport:   (pleasant and cooperative) Affect (typically observed):  Accepting, Adaptable Orientation:    Alcohol / Substance use:  Not Applicable Psych involvement (Current and /or in the community):  No (Comment)  Discharge Needs  Concerns to be addressed:  Basic Needs Readmission within the last 30 days:  No Current discharge risk:  None   Barriers to Discharge:  No Barriers Identified    , LCSW 03/10/2015, 11:51 AM  

## 2015-03-10 NOTE — Progress Notes (Signed)
Overnight oximetry on rm air initiated at 01:41. Pt has not yet been to sleep.

## 2015-03-10 NOTE — Evaluation (Signed)
Physical Therapy Evaluation Patient Details Name: Carrie Glenn MRN: [id number] DOB: 01-16-1941 Today's Date: 03/10/2015   History of Present Illness  Pt is a 74 y.o. female presenting to ER with hypotension, lethargy, UTI as well as progressive weakness and back pain.  Pt admitted with sepsis.  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  Clinical Impression  Currently pt demonstrates impairments with activity tolerance, balance, and limitations with functional mobility.  Prior to admission, pt was independent with functional mobility using rollator in her home; gets meals on wheels, family assist for cleaning.  Pt lives alone in 1 level home with 3 steps to enter with railing/grab bar.  Currently pt is min to mod assist to stand (posterior loss of balance upon standing) and CGA to min assist with ambulation using RW.  Pt would benefit from skilled PT to address above noted impairments and functional limitations.  Recommend pt discharge to STR when medically appropriate (pt and pt's daughter educated on pt's current assist levels and that pt could discharge home with 24/7 assist but pt and pt's daughter reporting limited assist available upon discharge home; d/t pt not having 24/7 assist, currently recommend STR).     Follow Up Recommendations SNF    Equipment Recommendations       Recommendations for Other Services       Precautions / Restrictions Precautions Precautions: Fall Precaution Comments: Legally blind; HOH Restrictions Weight Bearing Restrictions: No      Mobility  Bed Mobility Overal bed mobility: Needs Assistance Bed Mobility: Supine to Sit     Supine to sit: Mod assist     General bed mobility comments: assist for trunk  Transfers Overall transfer level: Needs assistance Equipment used: Rolling Gao (2 wheeled) Transfers: Sit to/from Stand Sit to Stand: Min assist;Mod assist         General transfer comment:  assist to initiate stand required;  pt with posterior loss of balance upon standing requiring assist to steady  Ambulation/Gait Ambulation/Gait assistance: Min assist;Min guard;+2 safety/equipment (2nd assist for IV pole management) Ambulation Distance (Feet): 70 Feet Assistive device: Rolling Henrickson (2 wheeled)       General Gait Details: significant decreased B step length/foot clearance/heelstrike  Stairs            Wheelchair Mobility    Modified Rankin (Stroke Patients Only)       Balance Overall balance assessment: Needs assistance             Standing balance comment: Posterior loss of balance upon standing requiring assist to steady                             Pertinent Vitals/Pain Pain Assessment: No/denies pain  Vitals stable and WFL throughout treatment session.     Home Living Family/patient expects to be discharged to:: Private residence Living Arrangements: Alone Available Help at Discharge:  (very limited help from daughter available (pt's daughter works 3 jobs))   Home Access: Stairs to enter   Secretary/administrator of Steps: 2 with B railing plus 1 with grab bar into home Home Layout: One level Home Equipment: Environmental consultant - 4 wheels;Toilet riser;Shower seat;Shower seat - built in;Cane - single point Additional Comments: Independent with laundry    Prior Function Level of Independence: Needs assistance   Gait / Transfers Assistance Needed: Independent with rollator with seat in home; [place] in community holding onto someone's  arm  ADL's / Homemaking Assistance Needed: Meals on wheels; niece assists with cleaning; daughter assists with medication management (pill box set up every 2 weeks)  Comments: Legally blind (pt's daughter reports no vision in L eye and minimal in R eye)     Hand Dominance        Extremity/Trunk Assessment   Upper Extremity Assessment: Generalized weakness           Lower Extremity Assessment:  Generalized weakness         Communication   Communication: HOH  Cognition Arousal/Alertness: Awake/alert Behavior During Therapy: WFL for tasks assessed/performed Overall Cognitive Status: Within Functional Limits for tasks assessed                      General Comments  Pt's daughter present for most of PT session and reporting feeling "overwhelmed".  Nursing cleared pt for participation in physical therapy.  Pt agreeable to PT session.     Exercises   Treatment:  Performed sitting exercises x 10 reps B LE's:  Heel/toe raises (AROM R; AROM L); LAQ's (AROM R; AROM L); marching/hip flexion (AROM R; AROM L); hip aDduction isometrics (pillow between pt's knees) x3 second holds (AROM B).  Pt required vc's and tactile cues for correct technique with exercises.  Also perform transfer bed to/from commode no AD with min assist x2.      Assessment/Plan    PT Assessment Patient needs continued PT services  PT Diagnosis Difficulty walking;Generalized weakness   PT Problem List Decreased strength;Decreased activity tolerance;Decreased balance;Decreased mobility  PT Treatment Interventions DME instruction;Gait training;Stair training;Functional mobility training;Therapeutic activities;Therapeutic exercise;Patient/family education;Balance training   PT Goals (Current goals can be found in the Care Plan section) Acute Rehab PT Goals Patient Stated Goal: To go home PT Goal Formulation: With patient/family Time For Goal Achievement: 03/24/15 Potential to Achieve Goals: Fair    Frequency Min 2X/week   Barriers to discharge Decreased caregiver support      Co-evaluation               End of Session Equipment Utilized During Treatment: Gait belt Activity Tolerance: Patient tolerated treatment well Patient left: in chair;with call bell/phone within reach;with chair alarm set;with family/visitor present           Time: 0940-1030 PT Time Calculation (min) (ACUTE ONLY): 50  min   Charges:   PT Evaluation $Initial PT Evaluation Tier I: 1 Procedure PT Treatments $Therapeutic Exercise: 8-22 mins   PT G CodesHendricks Limes 19-Mar-2015, 11:01 AM Hendricks Limes, PT [phone]

## 2015-03-10 NOTE — Care Management Note (Addendum)
Case Management Note  Patient Details  Name: Carrie Glenn MRN: [id number] Date of Birth: 1940-11-19  Subjective/Objective:                  Patient resting in bed. Very hard of hearing. Breakfast sitting on bedside table; patient does not want to eat it due to "not feeling well". She states she is from home alone. She ambulates with a rollator. She receives meals on wheels assistance. She states she is able to bathe herself but does not cook. Her daughter [name] provides some assistance but works full time. Patient stressed concern with discharge since she did not have assistance in the home. She states she has used home health in the past (3 years ago after a fall) but does not recall name of agency. Dr. Glendon Axe is her PCP. She relies on her daughter for transportation.   Action/Plan: PT pending. RNCM will continue to follow.   Expected Discharge Date:                  Expected Discharge Plan:     In-House Referral:  Clinical Social Work  Discharge planning Services  CM Consult  Post Acute Care Choice:    Choice offered to:  Patient  DME Arranged:    DME Agency:     HH Arranged:   [place] Agency:     Status of Service:     Medicare Important Message Given:  Yes Date Medicare IM Given:  03/10/15 Medicare IM give by:  Marshell Garfinkel Date Additional Medicare IM Given:    Additional Medicare Important Message give by:     If discussed at [place] of Stay Meetings, dates discussed:    Additional Comments: Met with patient's daughter to discuss discharge planning. She states she has tried to get assistance through [place] but was told "it is only for the veteran" which was her husband. I talked to her about [facility] application. [facility] with [facility] at [facility]; referral faxed to [facility]. Patient and daughter plan to return home with assistance from family. Home health orders needed including Medical necessity. Referral made to [facility] per patient request.   Marshell Garfinkel, RN 03/10/2015, 9:14 AM

## 2015-03-11 LAB — URINE CULTURE

## 2015-03-11 LAB — GLUCOSE, CAPILLARY
GLUCOSE-CAPILLARY: 89 mg/dL (ref 65–99)
Glucose-Capillary: 85 mg/dL (ref 65–99)
Glucose-Capillary: 90 mg/dL (ref 65–99)

## 2015-03-11 NOTE — Progress Notes (Signed)
Physical Therapy Treatment Patient Details Name: Carrie Glenn MRN: [id number] DOB: 03-15-1941 Today's Date: 03/11/2015    History of Present Illness Pt is a 74 y.o. female presenting to ER with hypotension, lethargy, UTI as well as progressive weakness and back pain.  Pt admitted with sepsis.  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    PT Comments    Pt progressing ambulation distance and decreasing assist for bed mobility, transfers and gait quality and distance. Pt received up in chair and encouraged to remain so for a couple of hours if tolerated. Spoke with niece who takes an active role in patients care; niece agreeable and appreciative of therapy.   Follow Up Recommendations  SNF     Equipment Recommendations       Recommendations for Other Services       Precautions / Restrictions Precautions Precautions: Fall Precaution Comments: Legally blind; HOH Restrictions Weight Bearing Restrictions: No    Mobility  Bed Mobility Overal bed mobility: Needs Assistance Bed Mobility: Supine to Sit     Supine to sit: Min assist        Transfers Overall transfer level: Needs assistance Equipment used: Rolling Tinkey (2 wheeled) Transfers: Sit to/from Stand Sit to Stand: Min assist            Ambulation/Gait Ambulation/Gait assistance: Min assist (Min A for direction only; otherwise Min guard) Ambulation Distance (Feet): 185 Feet Assistive device: Rolling Hanel (2 wheeled) Gait Pattern/deviations: Step-through pattern;Decreased step length - right;Decreased step length - left;Decreased stride length;Trunk flexed Gait velocity: Reduced Gait velocity interpretation: <1.8 ft/sec, indicative of risk for recurrent falls     Stairs            Wheelchair Mobility    Modified Rankin (Stroke Patients Only)       Balance Overall balance assessment: Needs assistance         Standing balance support: Bilateral  upper extremity supported Standing balance-Leahy Scale: Fair                      Cognition Arousal/Alertness: Awake/alert Behavior During Therapy: WFL for tasks assessed/performed Overall Cognitive Status: Within Functional Limits for tasks assessed                      Exercises General Exercises - Lower Extremity Ankle Circles/Pumps: AROM;Both;20 reps;Seated Quad Sets: Strengthening;Both;20 reps;Seated (Increased time/instructions before understanding exercise) Gluteal Sets: Strengthening;Both;20 reps;Seated Short Arc Quad: AROM;Both;20 reps;Seated Heel Slides: AAROM;Both;20 reps;Seated Hip ABduction/ADduction: AAROM;Both;20 reps;Seated    General Comments        Pertinent Vitals/Pain Pain Assessment: 0-10 Pain Score: 5  Pain Location: R Knee Pain Intervention(s): Monitored during session;Premedicated before session    Home Living                      Prior Function            PT Goals (current goals can now be found in the care plan section) Progress towards PT goals: Progressing toward goals    Frequency  Min 2X/week    PT Plan Current plan remains appropriate    Co-evaluation             End of Session Equipment Utilized During Treatment: Gait belt Activity Tolerance: Patient tolerated treatment well (Notes weakness/fatigue in B knees) Patient left: in chair;with call bell/phone within reach;with chair alarm set;with family/visitor present  Time: 1325-1401 PT Time Calculation (min) (ACUTE ONLY): 36 min  Charges:  $Gait Training: 8-22 mins $Therapeutic Exercise: 8-22 mins                    G Codes:      [name] 03/11/2015, 2:22 PM

## 2015-03-11 NOTE — Progress Notes (Signed)
Carrie Glenn is a 74 y.o. female  Sepsis   SUBJECTIVE:  Patient c/o generalized weakness, right knee pain and difficulty ambulating. No c/o dizziness, headache, focal weakness or other neurologic complaints. Family members at bedside. They are requesting a hospital bed for her at home. Patient's niece will stay with her at home after discharge.  ______________________________________________________________________  ROS: Review of systems is unremarkable for any active cardiac,respiratory, GI, GU, hematologic, neurologic or psychiatric systems, 10 systems reviewed.  @CMEDLIST @  Past Medical History  Diagnosis Date  . Hypertension   . Diabetes mellitus without complication   . Pain     Chronic back  . Blind     Legally  . Hard of hearing   . Retinitis pigmentosa of both eyes   . Arthritis   . Osteoporosis     Past Surgical History  Procedure Laterality Date  . Hiatal hernia repair    . Left foot surgery  Left     Hammer toe fixation-2nd toe  . 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  ASSESSMENT/PLAN:  Labs and imaging studies were reviewed  UTI: preliminary blood and urine cultures are negative. Shall continue antibiotics and follow cultures.  Acute renal failure and hypotension on admission: resolved, Cr improved to 1.1 with IV hydration.  Hypertension: continue  Metoprolol, monitor BP.  Regular insulin coverage was discontinued for borderline low blood sugars yesterday.  Generalized weakness: continue physical therapy.  Discharge planning for possible discharge in AM.

## 2015-03-11 NOTE — Care Management (Addendum)
Per RN patient appears weaker on one side today. RN will follow up with MD. Daughter wants to take patient home although RN said she "can hardly stand today". Daughter has requested a hospital bed for the home. RNCM will continue to follow.   Patient has generalized weakness, legally blind which requires head of bed to be positioned in ways not feasible with a normal bed. Head must be elevated at least 30 degrees or higher.

## 2015-03-12 ENCOUNTER — Inpatient Hospital Stay: Payer: Medicare Other

## 2015-03-12 LAB — GLUCOSE, CAPILLARY
Glucose-Capillary: 90 mg/dL (ref 65–99)
Glucose-Capillary: 102 mg/dL — ABNORMAL HIGH (ref 65–99)

## 2015-03-12 MED ORDER — DOCUSATE SODIUM 100 MG PO CAPS: 100.0000 mg | ORAL_CAPSULE | Freq: Two times a day (BID) | ORAL | Status: DC

## 2015-03-12 MED ORDER — ACETAMINOPHEN 325 MG PO TABS: 650.0000 mg | ORAL_TABLET | Freq: Four times a day (QID) | ORAL | Status: DC | PRN

## 2015-03-12 MED ORDER — LEVOFLOXACIN 250 MG PO TABS: 250.0000 mg | ORAL_TABLET | Freq: Every day | ORAL | Status: DC

## 2015-03-12 NOTE — Discharge Summary (Signed)
Physician Discharge Summary  Patient ID: Carrie Glenn MRN: [id number] DOB/AGE: December 03, 1940 74 y.o.  Admit date: 03/08/2015 Discharge date: 03/12/2015  Admission Diagnoses: Acute UTI, sepsis, hypotension, acute renal failure, generalized weakness.  Discharge Diagnoses:  Principal Problem:   Sepsis Active Problems:   Acute UTI   Hypotension   Weakness generalized   Chronic back pain   Acute renal failure   Discharged Condition: stable  Hospital Course: Patient was started on IV antibiotics and IV fluids for UTI, sepsis and hypotension. Blood and urine cultures were obtained. Acute renal failure and hypotension noted on admission gradually resolved with IV hydration. Creatinine improved to 1.1 prior to discharge. Metoprolol was resumed for elevated blood pressure and history of hypertension. Regular insulin coverage was discontinued for borderline low blood sugars noted during her stay. She received physical therapy for generalized weakness and difficulty ambulating. She c/o right knee pain and difficulty walking. X ray revealed severe osteoarthritis. Urine culture revealed E.coli sensitive to Ceftiaxone and Fluoroquinolones. Plan is to discharge patient home today on oral Levofloxacin. Diabetes: shall resume Januvia 50 mg daily. Hold Amaryl and Actos for low blood sugars. Hypertension: shall continue Metoprolol. Hold Benicar and HCTZ for now. Re-evaluate anti-hypertensive and diabetic medications at follow up in my office next week. Advised to monitor blood sugar and blood pressure at home. Home health services including visiting nurse and physical therapy. Hospital bed will be delivered today between 1-3 PM, as per the daughter. Patient's niece will stay with her at home.   Discharge Exam: Blood pressure 148/71, pulse 74, temperature 98.1 F (36.7 C), temperature source Oral, resp. rate 18, height 5' 3" (1.6 m), weight 72.394 kg (159 lb 9.6 oz), SpO2 96 %. General appearance: alert,  cooperative, appears stated age and no distress Head: Normocephalic, without obvious abnormality, atraumatic Neck: no adenopathy, no JVD, supple, symmetrical, trachea midline and thyroid not enlarged.  Resp: clear to auscultation bilaterally Cardio: regular rate and rhythm, S1, S2 normal, no murmur. GI: soft, non-tender; bowel sounds normal; no masses,  no organomegaly. Extremities: atraumatic, no lower leg edema Neurologic: Alert and oriented X 3, moving all extremities.  Disposition:      Medication List    STOP taking these medications        glimepiride 4 MG tablet  Commonly known as:  AMARYL     hydrochlorothiazide 12.5 MG tablet  Commonly known as:  HYDRODIURIL     olmesartan 20 MG tablet  Commonly known as:  BENICAR     pioglitazone 45 MG tablet  Commonly known as:  ACTOS      TAKE these medications        ACCU-CHEK COMPACT CARE KIT Kit  1 kit by Other route See admin instructions.     acetaminophen 325 MG tablet  Commonly known as:  TYLENOL  Take 2 tablets (650 mg total) by mouth every 6 (six) hours as needed for mild pain (or Fever >/= 101).     ALPRAZolam 0.5 MG 24 hr tablet  Commonly known as:  XANAX XR  Take 0.5 mg by mouth every morning.     aspirin EC 81 MG tablet  Take 81 mg by mouth daily.     Calcium Carbonate-Vitamin D 600-400 MG-UNIT per tablet  Take 1 tablet by mouth daily.     CENTRUM SILVER tablet  Take 1 tablet by mouth daily.     docusate sodium 100 MG capsule  Commonly known as:  COLACE  Take 1 capsule (100 mg  total) by mouth 2 (two) times daily.     fluticasone 50 MCG/ACT nasal spray  Commonly known as:  FLONASE  Place 2 sprays into the nose at bedtime as needed.     gabapentin 300 MG capsule  Commonly known as:  NEURONTIN  Take 300 mg by mouth at bedtime.     glucose blood test strip  1 each 2 (two) times daily.     HYDROcodone-acetaminophen 7.5-325 MG per tablet  Commonly known as:  NORCO  Take 1 tablet by mouth 3  (three) times daily as needed for moderate pain or severe pain.  Start taking on:  05/06/2015     ibuprofen 800 MG tablet  Commonly known as:  ADVIL,MOTRIN  Take 800 mg by mouth 2 (two) times daily.     isosorbide mononitrate 30 MG 24 hr tablet  Commonly known as:  IMDUR  Take 30 mg by mouth every morning.     levofloxacin 250 MG tablet  Commonly known as:  LEVAQUIN  Take 1 tablet (250 mg total) by mouth daily.     metoprolol 50 MG tablet  Commonly known as:  LOPRESSOR  Take 75 mg by mouth 2 (two) times daily.     mirtazapine 15 MG tablet  Commonly known as:  REMERON  Take 15 mg by mouth at bedtime.     omeprazole 20 MG tablet  Commonly known as:  PRILOSEC OTC  Take 20 mg by mouth 2 (two) times daily as needed.     rosuvastatin 10 MG tablet  Commonly known as:  CRESTOR  Take 10 mg by mouth 3 (three) times a week. Take on Monday, Wednesday, and Friday.     sitaGLIPtin 50 MG tablet  Commonly known as:  JANUVIA  Take 50 mg by mouth daily.     solifenacin 5 MG tablet  Commonly known as:  VESICARE  Take 5 mg by mouth every morning.     Vitamin D3 2000 UNITS capsule  Take 2,000 Units by mouth daily.         Signed: Walterine Amodei 03/12/2015, 1:00 PM

## 2015-03-12 NOTE — Discharge Instructions (Signed)
Diabetes: resume Januvia 50 mg daily. Hold Amaryl and Actos for low blood sugars during hospital stay. Monitor blood sugars at home Hypertension: continue Metoprolol. Hold Benicar and HCTZ for now. Monitor BP at home.  Follow up in my office within one week for re-evaluation of anti-hypertensive and diabetic medications.

## 2015-03-12 NOTE — Progress Notes (Signed)
Patient  cont on IV antibiotics. Assist to [facility], voiding without difficulty. Pain controlled with PRN meds. Sleeping between care, family at bedside.

## 2015-03-13 LAB — CULTURE, BLOOD (ROUTINE X 2)
Culture: NO GROWTH
Culture: NO GROWTH

## 2015-04-06 ENCOUNTER — Emergency Department: Payer: Medicare Other

## 2015-04-06 ENCOUNTER — Emergency Department
Admission: EM | Admit: 2015-04-06 | Discharge: 2015-04-07 | Disposition: A | Discharge status: Other Acute Inpatient Hospital | Payer: Medicare Other | Attending: Student | Admitting: Student

## 2015-04-06 ENCOUNTER — Encounter: Payer: Self-pay | Admitting: Emergency Medicine

## 2015-04-06 DIAGNOSIS — Y9289 Other specified places as the place of occurrence of the external cause: Secondary | ICD-10-CM | POA: Insufficient documentation

## 2015-04-06 DIAGNOSIS — S8992XA Unspecified injury of left lower leg, initial encounter: Secondary | ICD-10-CM | POA: Diagnosis present

## 2015-04-06 DIAGNOSIS — A419 Sepsis, unspecified organism: Secondary | ICD-10-CM | POA: Diagnosis not present

## 2015-04-06 DIAGNOSIS — Y9389 Activity, other specified: Secondary | ICD-10-CM | POA: Diagnosis not present

## 2015-04-06 DIAGNOSIS — E119 Type 2 diabetes mellitus without complications: Secondary | ICD-10-CM | POA: Diagnosis not present

## 2015-04-06 DIAGNOSIS — R6510 Systemic inflammatory response syndrome (SIRS) of non-infectious origin without acute organ dysfunction: Secondary | ICD-10-CM

## 2015-04-06 DIAGNOSIS — R410 Disorientation, unspecified: Secondary | ICD-10-CM | POA: Diagnosis not present

## 2015-04-06 DIAGNOSIS — Z880 Allergy status to penicillin: Secondary | ICD-10-CM | POA: Insufficient documentation

## 2015-04-06 DIAGNOSIS — Z79899 Other long term (current) drug therapy: Secondary | ICD-10-CM | POA: Insufficient documentation

## 2015-04-06 DIAGNOSIS — I10 Essential (primary) hypertension: Secondary | ICD-10-CM | POA: Diagnosis not present

## 2015-04-06 DIAGNOSIS — W01198A Fall on same level from slipping, tripping and stumbling with subsequent striking against other object, initial encounter: Secondary | ICD-10-CM | POA: Diagnosis not present

## 2015-04-06 DIAGNOSIS — W19XXXA Unspecified fall, initial encounter: Secondary | ICD-10-CM

## 2015-04-06 DIAGNOSIS — S8991XA Unspecified injury of right lower leg, initial encounter: Secondary | ICD-10-CM | POA: Insufficient documentation

## 2015-04-06 DIAGNOSIS — Y998 Other external cause status: Secondary | ICD-10-CM | POA: Diagnosis not present

## 2015-04-06 LAB — COMPREHENSIVE METABOLIC PANEL
ALK PHOS: 47 U/L (ref 38–126)
ALT: 8 U/L — ABNORMAL LOW (ref 14–54)
AST: 20 U/L (ref 15–41)
Albumin: 3.7 g/dL (ref 3.5–5.0)
Anion gap: 9 (ref 5–15)
BUN: 13 mg/dL (ref 6–20)
CALCIUM: 8.6 mg/dL — AB (ref 8.9–10.3)
CO2: 27 mmol/L (ref 22–32)
Chloride: 103 mmol/L (ref 101–111)
Creatinine, Ser: 0.96 mg/dL (ref 0.44–1.00)
GFR calc non Af Amer: 56 mL/min — ABNORMAL LOW (ref >60)
GLUCOSE: 144 mg/dL — AB (ref 65–99)
POTASSIUM: 3.1 mmol/L — AB (ref 3.5–5.1)
SODIUM: 139 mmol/L (ref 135–145)
TOTAL PROTEIN: 6.7 g/dL (ref 6.5–8.1)
Total Bilirubin: 0.7 mg/dL (ref 0.3–1.2)

## 2015-04-06 LAB — URINALYSIS COMPLETE WITH MICROSCOPIC (ARMC ONLY)
Bacteria, UA: NONE SEEN
Bilirubin Urine: NEGATIVE
Glucose, UA: NEGATIVE mg/dL
Hgb urine dipstick: NEGATIVE
Ketones, ur: NEGATIVE mg/dL
Leukocytes, UA: NEGATIVE
Nitrite: NEGATIVE
Protein, ur: NEGATIVE mg/dL
Specific Gravity, Urine: 1.017 (ref 1.005–1.030)
pH: 6 (ref 5.0–8.0)

## 2015-04-06 LAB — CBC
HCT: 35.1 % (ref 35.0–47.0)
HEMOGLOBIN: 11.7 g/dL — AB (ref 12.0–16.0)
MCH: 31.6 pg (ref 26.0–34.0)
MCHC: 33.4 g/dL (ref 32.0–36.0)
MCV: 94.6 fL (ref 80.0–100.0)
Platelets: 101 10*3/uL — ABNORMAL LOW (ref 150–440)
RBC: 3.72 MIL/uL — ABNORMAL LOW (ref 3.80–5.20)
RDW: 13.1 % (ref 11.5–14.5)
WBC: 7.5 10*3/uL (ref 3.6–11.0)

## 2015-04-06 LAB — LACTIC ACID, PLASMA: LACTIC ACID, VENOUS: 1.2 mmol/L (ref 0.5–2.0)

## 2015-04-06 LAB — GLUCOSE, CAPILLARY: Glucose-Capillary: 148 mg/dL — ABNORMAL HIGH (ref 65–99)

## 2015-04-06 MED ORDER — SODIUM CHLORIDE 0.9 % IV BOLUS (SEPSIS)
1000.0000 mL | Freq: Once | INTRAVENOUS | Status: AC
  Administered 2015-04-07: 1000 mL via INTRAVENOUS

## 2015-04-06 MED ORDER — LEVOFLOXACIN IN D5W 750 MG/150ML IV SOLN
INTRAVENOUS | Status: AC
  Administered 2015-04-06: 750 mg via INTRAVENOUS
  Filled 2015-04-06: qty 150

## 2015-04-06 MED ORDER — ACETAMINOPHEN 500 MG PO TABS
ORAL_TABLET | ORAL | Status: AC
  Administered 2015-04-06: 1000 mg via ORAL
  Filled 2015-04-06: qty 2

## 2015-04-06 MED ORDER — SODIUM CHLORIDE 0.9 % IV BOLUS (SEPSIS)
1000.0000 mL | Freq: Once | INTRAVENOUS | Status: AC
Start: 2015-04-06 — End: 2015-04-07
  Administered 2015-04-06: 1000 mL via INTRAVENOUS

## 2015-04-06 MED ORDER — VANCOMYCIN HCL IN DEXTROSE 1-5 GM/200ML-% IV SOLN
1000.0000 mg | Freq: Once | INTRAVENOUS | Status: AC
  Administered 2015-04-07: 1000 mg via INTRAVENOUS

## 2015-04-06 MED ORDER — ACETAMINOPHEN 500 MG PO TABS
1000.0000 mg | ORAL_TABLET | Freq: Once | ORAL | Status: AC
  Administered 2015-04-06: 1000 mg via ORAL

## 2015-04-06 MED ORDER — LEVOFLOXACIN IN D5W 750 MG/150ML IV SOLN
750.0000 mg | Freq: Once | INTRAVENOUS | Status: AC
  Administered 2015-04-06: 750 mg via INTRAVENOUS

## 2015-04-06 NOTE — ED Notes (Signed)
Blood culture x 2 sent to lab

## 2015-04-06 NOTE — ED Notes (Signed)
EMS reports blood glucose 161; BP 174/88, co2 32, sats 98%; pt was placed on 4L via [place] enroute to see if mental status improved; paramedic childers reports no improvement with oxygen; pt 96% on room air; pt does not wear oxygen

## 2015-04-06 NOTE — ED Notes (Signed)
EMS pt from home with altered mental status per family; pt fell yesterday; then fell again today; since the second fall pt has had visual hallucinations-talking to people that are not present; pt is leaning to the right; thick speech; c/o bilateral knee pain; right leg shortened-pt denies any previous hip fracture; following commands; answering questions appropriately at this time

## 2015-04-06 NOTE — ED Provider Notes (Signed)
[facility] Emergency Department Provider Note  ____________________________________________  Time seen: Approximately 11:08 PM  I have reviewed the triage vital signs and the nursing notes.   HISTORY  Chief Complaint Fall; Altered Mental Status; Knee Pain; and Hallucinations    HPI Carrie Glenn is a 74 y.o. female with history of diabetes, hypertension, back pain presents for evaluation of confusion, increased falls for the past 2 days. According to family, she had an unwitnessed fall 2 days ago when she did hit her head. Since that time she has had intermittent confusion, has seemed generally weak, falling often when they try to walk her. No fevers, chills, vomiting, diarrhea. This has been gradual in onset, constant since onset. Current severity is severe. No modifying factors. She was recent hospitalized for urinary tract infection with weakness and did complete her by mouth antibiotics.   Past Medical History  Diagnosis Date  . Hypertension   . Diabetes mellitus without complication   . Pain     Chronic back  . Blind     Legally  . Hard of hearing   . Retinitis pigmentosa of both eyes   . Arthritis   . Osteoporosis     Patient Active Problem List   Diagnosis Date Noted  . Acute renal failure 03/09/2015  . Sepsis 03/08/2015  . Acute UTI 03/08/2015  . Hypotension 03/08/2015  . Weakness generalized 03/08/2015  . Chronic back pain 03/08/2015    Past Surgical History  Procedure Laterality Date  . Hiatal hernia repair    . Left foot surgery  Left     Hammer toe fixation-2nd toe  . 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 hydrochlorothiazide (MICROZIDE) 12.5 MG capsule   Oral   Take 12.5 mg by mouth daily.         Marland Kitchen olmesartan (BENICAR) 20 MG tablet   Oral   Take 10 mg by mouth 2 (two) times daily.         Marland Kitchen acetaminophen  (TYLENOL) 325 MG tablet   Oral   Take 2 tablets (650 mg total) by mouth every 6 (six) hours as needed for mild pain (or Fever >/= 101).   60 tablet   0   . ALPRAZolam (XANAX XR) 0.5 MG 24 hr tablet   Oral   Take 0.5 mg by mouth every morning.         Marland Kitchen aspirin EC 81 MG tablet   Oral   Take 81 mg by mouth daily.         . Blood Glucose Monitoring Suppl (ACCU-CHEK COMPACT CARE KIT) KIT   Other   1 kit by Other route See admin instructions.         . Calcium Carbonate-Vitamin D 600-400 MG-UNIT per tablet   Oral   Take 1 tablet by mouth daily.         . Cholecalciferol (VITAMIN D3) 2000 UNITS capsule   Oral   Take 2,000 Units by mouth daily.         Marland Kitchen docusate sodium (COLACE) 100 MG capsule   Oral   Take 1 capsule (100 mg total) by mouth 2 (two) times daily.   10 capsule   0   . fluticasone (FLONASE) 50 MCG/ACT nasal spray   Nasal   Place 2 sprays into the nose  at bedtime as needed.         . gabapentin (NEURONTIN) 300 MG capsule   Oral   Take 300 mg by mouth at bedtime.          Marland Kitchen glucose blood test strip      1 each 2 (two) times daily.         Marland Kitchen HYDROcodone-acetaminophen (NORCO) 7.5-325 MG per tablet   Oral   Take 1 tablet by mouth 3 (three) times daily as needed for moderate pain or severe pain.          Marland Kitchen ibuprofen (ADVIL,MOTRIN) 800 MG tablet   Oral   Take 800 mg by mouth 2 (two) times daily.         . isosorbide mononitrate (IMDUR) 30 MG 24 hr tablet   Oral   Take 30 mg by mouth every morning.         Marland Kitchen levofloxacin (LEVAQUIN) 250 MG tablet   Oral   Take 1 tablet (250 mg total) by mouth daily.   7 tablet   0   . metoprolol (LOPRESSOR) 50 MG tablet   Oral   Take 75 mg by mouth 2 (two) times daily.         . mirtazapine (REMERON) 15 MG tablet   Oral   Take 15 mg by mouth at bedtime.         . Multiple Vitamins-Minerals (CENTRUM SILVER) tablet   Oral   Take 1 tablet by mouth daily.         Marland Kitchen omeprazole (PRILOSEC  OTC) 20 MG tablet   Oral   Take 20 mg by mouth 2 (two) times daily as needed.         . rosuvastatin (CRESTOR) 10 MG tablet   Oral   Take 10 mg by mouth 3 (three) times a week. Take on Monday, Wednesday, and Friday.         . sitaGLIPtin (JANUVIA) 50 MG tablet   Oral   Take 50 mg by mouth daily.         . solifenacin (VESICARE) 5 MG tablet   Oral   Take 5 mg by mouth every morning.           Allergies Amlodipine; Erythromycin; Lexapro; Lipitor; Macrodantin; Penicillins; Sertraline hcl; Trazodone and nefazodone; Amoxicillin; Azithromycin; Effexor; and Metformin and related  History reviewed. No pertinent family history.  Social History History  Substance Use Topics  . Smoking status: Never Smoker   . Smokeless tobacco: Not on file  . Alcohol Use: No    Review of Systems Constitutional: No fever/chills Eyes: No visual changes. ENT: No sore throat. Cardiovascular: Denies chest pain. Respiratory: Denies shortness of breath. Gastrointestinal: No abdominal pain.  No nausea, no vomiting.  No diarrhea.  No constipation. Genitourinary: Negative for dysuria. Musculoskeletal: Negative for back pain. Skin: Negative for rash. Neurological: Negative for headaches, focal weakness or numbness.  10-point ROS otherwise negative.  ____________________________________________   PHYSICAL EXAM:  VITAL SIGNS: ED Triage Vitals  Enc Vitals Group     BP 04/06/15 2049 164/96 mmHg     Pulse Rate 04/06/15 2049 67     Resp 04/06/15 2049 18     Temp 04/06/15 2049 98.5 F (36.9 C)     Temp Source 04/06/15 2049 Oral     SpO2 04/06/15 2049 96 %     Weight 04/06/15 2049 159 lb (72.122 kg)     Height 04/06/15 2049 5' 3"  (1.6 m)  Head Cir --      Peak Flow --      Pain Score 04/06/15 2052 0     Pain Loc --      Pain Edu? --      Excl. in [place]? --     Constitutional: Alert and oriented x 3. Well appearing and in no acute distress. +shivering Eyes: Conjunctivae are normal.  PERRL. EOMI. Head: Atraumatic. Nose: No congestion/rhinnorhea. Mouth/Throat: Mucous membranes are moist.  Oropharynx non-erythematous. Neck: No stridor. No midline C-spine tenderness to palpation.  Cardiovascular: Normal rate, regular rhythm. Grossly normal heart sounds.  Good peripheral circulation. Respiratory: Normal respiratory effort.  No retractions. Lungs CTAB. Gastrointestinal: Soft and nontender. No distention. No abdominal bruits. No CVA tenderness. Genitourinary: deferred Musculoskeletal: No lower extremity tenderness nor edema.  No joint effusions. Full range of motion of bilateral hips, right lower extremity is slightly shortened but does not appear rotated, 2+ DP pulse in bilateral lower extremities, mild tenderness throughout bilateral knees but full range of motion. No midline tenderness to palpation throughout the T or L-spine. Neurologic:  Normal speech and language. No gross focal neurologic deficits are appreciated. Speech is normal.  Skin:  Skin is warm, dry and intact. No rash noted. Psychiatric: Mood and affect are normal. Speech and behavior are normal.  ____________________________________________   LABS (all labs ordered are listed, but only abnormal results are displayed)  Labs Reviewed  COMPREHENSIVE METABOLIC PANEL - Abnormal; Notable for the following:    Potassium 3.1 (*)    Glucose, Bld 144 (*)    Calcium 8.6 (*)    ALT 8 (*)    GFR calc non Af Amer 56 (*)    All other components within normal limits  URINALYSIS COMPLETEWITH MICROSCOPIC ([facility] ONLY) - Abnormal; Notable for the following:    Color, Urine YELLOW (*)    APPearance CLEAR (*)    Squamous Epithelial / LPF 0-5 (*)    All other components within normal limits  CBC - Abnormal; Notable for the following:    RBC 3.72 (*)    Hemoglobin 11.7 (*)    Platelets 101 (*)    All other components within normal limits  GLUCOSE, CAPILLARY - Abnormal; Notable for the following:    Glucose-Capillary 148  (*)    All other components within normal limits  CULTURE, BLOOD (ROUTINE X 2)  CULTURE, BLOOD (ROUTINE X 2)  LACTIC ACID, PLASMA  LACTIC ACID, PLASMA  CBG MONITORING, ED   ____________________________________________  EKG  ED ECG REPORT I, Joanne Gavel, the attending physician, personally viewed and interpreted this ECG.   Date: 04/06/2015  EKG Time: 20:48  Rate: 69  Rhythm: normal sinus rhythm  Axis: normal  Intervals:none  ST&T Change: No acute ST segment elevation, motion artifact limits interpretation.  ____________________________________________  RADIOLOGY  Right Hip/Pelvis xray IMPRESSION: Normal right hip.  Right knee xray IMPRESSION: Prominent tricompartment degenerative changes in the right knee. No acute bony abnormalities.  Left knee xray IMPRESSION: Tricompartment degenerative changes in the left knee. No acute bony abnormalities.   CT head and c-spine IMPRESSION: Mild diffuse cortical atrophy. Minimal chronic ischemic white matter disease. No acute intracranial abnormality seen. Postsurgical and degenerative changes are noted as described above. No acute abnormality seen in the cervical spine.   CXR pending   ____________________________________________   PROCEDURES  Procedure(s) performed: None  Critical Care performed: Yes, see critical care note(s). Total critical care time spent 35 minutes.  ____________________________________________   INITIAL IMPRESSION /  ASSESSMENT AND PLAN / ED COURSE  Pertinent labs & imaging results that were available during my care of the patient were reviewed by me and considered in my medical decision making (see chart for details).  Carrie Glenn is a 74 y.o. female with history of diabetes, hypertension, back pain presents for evaluation of confusion, increased falls for the past 2 days. On exam, she is nontoxic appearing, in no acute distress. Severely hard of hearing but oriented 3, follows all  commands. She has an intact neurological examination. Her exam is atraumatic. She is febrile to 101.3 and intermittently tachypneic with rate in the mid 20s, meeting 2 out of 4 Sirs criteria. Concern for sepsis. chest x-ray, urinalysis pending. IV fluids, vancomycin, levofloxacin, aztreonam ordered empirically. Anticipate admission.  ----------------------------------------- 11:57 PM on 04/06/2015 ----------------------------------------- Labs reviewed and are generally unremarkable with the exception of mild hypokalemia and mild anemia. Lactic acid is 1.2. Awaiting chest x-ray. At this time, source unknown. Patient is alert and oriented, neck is supple, doubt meningitis. She will require admission and per patient and family request, we'll transfer to [facility] for further management. Discussed with Dr. Dreama Saa, [facility] hospitalist, and he will accept transfer. ____________________________________________   FINAL CLINICAL IMPRESSION(S) / ED DIAGNOSES  Final diagnoses:  SIRS (systemic inflammatory response syndrome)  Fall, initial encounter      Joanne Gavel, MD 04/07/15 0000

## 2015-04-06 NOTE — ED Notes (Signed)
Blood culture x1 and lactic acid on ice sent to lab

## 2015-04-07 ENCOUNTER — Encounter (HOSPITAL_COMMUNITY): Payer: Self-pay | Admitting: *Deleted

## 2015-04-07 ENCOUNTER — Inpatient Hospital Stay (HOSPITAL_COMMUNITY): Payer: Medicare Other

## 2015-04-07 ENCOUNTER — Inpatient Hospital Stay (HOSPITAL_COMMUNITY)
Admission: AD | Admit: 2015-04-07 | Discharge: 2015-04-10 | DRG: 193 | Disposition: A | Discharge status: Skilled Nursing Facility | Payer: Medicare Other | Source: Other Acute Inpatient Hospital | Attending: Internal Medicine | Admitting: Internal Medicine

## 2015-04-07 DIAGNOSIS — E118 Type 2 diabetes mellitus with unspecified complications: Secondary | ICD-10-CM

## 2015-04-07 DIAGNOSIS — M79605 Pain in left leg: Secondary | ICD-10-CM | POA: Diagnosis present

## 2015-04-07 DIAGNOSIS — H540 Blindness, both eyes: Secondary | ICD-10-CM | POA: Diagnosis present

## 2015-04-07 DIAGNOSIS — H9190 Unspecified hearing loss, unspecified ear: Secondary | ICD-10-CM | POA: Diagnosis present

## 2015-04-07 DIAGNOSIS — R509 Fever, unspecified: Secondary | ICD-10-CM | POA: Diagnosis not present

## 2015-04-07 DIAGNOSIS — J189 Pneumonia, unspecified organism: Secondary | ICD-10-CM | POA: Diagnosis present

## 2015-04-07 DIAGNOSIS — R443 Hallucinations, unspecified: Secondary | ICD-10-CM | POA: Diagnosis present

## 2015-04-07 DIAGNOSIS — R296 Repeated falls: Secondary | ICD-10-CM | POA: Diagnosis not present

## 2015-04-07 DIAGNOSIS — H548 Legal blindness, as defined in USA: Secondary | ICD-10-CM | POA: Diagnosis present

## 2015-04-07 DIAGNOSIS — Z888 Allergy status to other drugs, medicaments and biological substances status: Secondary | ICD-10-CM

## 2015-04-07 DIAGNOSIS — Z7982 Long term (current) use of aspirin: Secondary | ICD-10-CM

## 2015-04-07 DIAGNOSIS — M1990 Unspecified osteoarthritis, unspecified site: Secondary | ICD-10-CM | POA: Diagnosis present

## 2015-04-07 DIAGNOSIS — E876 Hypokalemia: Secondary | ICD-10-CM

## 2015-04-07 DIAGNOSIS — R531 Weakness: Secondary | ICD-10-CM

## 2015-04-07 DIAGNOSIS — E119 Type 2 diabetes mellitus without complications: Secondary | ICD-10-CM

## 2015-04-07 DIAGNOSIS — I10 Essential (primary) hypertension: Secondary | ICD-10-CM

## 2015-04-07 DIAGNOSIS — M810 Age-related osteoporosis without current pathological fracture: Secondary | ICD-10-CM | POA: Diagnosis present

## 2015-04-07 DIAGNOSIS — G9340 Encephalopathy, unspecified: Secondary | ICD-10-CM | POA: Diagnosis present

## 2015-04-07 DIAGNOSIS — Z880 Allergy status to penicillin: Secondary | ICD-10-CM

## 2015-04-07 DIAGNOSIS — Z87440 Personal history of urinary (tract) infections: Secondary | ICD-10-CM

## 2015-04-07 DIAGNOSIS — Z881 Allergy status to other antibiotic agents status: Secondary | ICD-10-CM | POA: Diagnosis not present

## 2015-04-07 DIAGNOSIS — S8992XA Unspecified injury of left lower leg, initial encounter: Secondary | ICD-10-CM | POA: Diagnosis not present

## 2015-04-07 DIAGNOSIS — Z89422 Acquired absence of other left toe(s): Secondary | ICD-10-CM

## 2015-04-07 DIAGNOSIS — M25569 Pain in unspecified knee: Secondary | ICD-10-CM | POA: Diagnosis present

## 2015-04-07 DIAGNOSIS — E860 Dehydration: Secondary | ICD-10-CM | POA: Diagnosis present

## 2015-04-07 DIAGNOSIS — R29898 Other symptoms and signs involving the musculoskeletal system: Secondary | ICD-10-CM

## 2015-04-07 HISTORY — DX: Type 2 diabetes mellitus without complications: E11.9

## 2015-04-07 HISTORY — DX: Essential (primary) hypertension: I10

## 2015-04-07 LAB — TROPONIN I
Troponin I: <0.03 ng/mL (ref <0.031)
Troponin I: <0.03 ng/mL

## 2015-04-07 LAB — GLUCOSE, CAPILLARY
GLUCOSE-CAPILLARY: 106 mg/dL — AB (ref 65–99)
GLUCOSE-CAPILLARY: 120 mg/dL — AB (ref 65–99)
Glucose-Capillary: 133 mg/dL — ABNORMAL HIGH (ref 65–99)
Glucose-Capillary: 144 mg/dL — ABNORMAL HIGH (ref 65–99)

## 2015-04-07 LAB — MRSA PCR SCREENING: MRSA by PCR: NEGATIVE

## 2015-04-07 LAB — LACTIC ACID, PLASMA: LACTIC ACID, VENOUS: 0.7 mmol/L (ref 0.5–2.0)

## 2015-04-07 LAB — TSH: TSH: 0.632 u[IU]/mL (ref 0.350–4.500)

## 2015-04-07 MED ORDER — METOPROLOL TARTRATE 50 MG PO TABS
75.0000 mg | ORAL_TABLET | Freq: Two times a day (BID) | ORAL | Status: DC
  Administered 2015-04-07 – 2015-04-10 (×6): 75 mg via ORAL
  Filled 2015-04-07 (×10): qty 1

## 2015-04-07 MED ORDER — HEPARIN SODIUM (PORCINE) 5000 UNIT/ML IJ SOLN
5000.0000 [IU] | Freq: Three times a day (TID) | INTRAMUSCULAR | Status: DC
  Administered 2015-04-07 – 2015-04-10 (×9): 5000 [IU] via SUBCUTANEOUS
  Filled 2015-04-07 (×11): qty 1

## 2015-04-07 MED ORDER — HEPARIN SODIUM (PORCINE) 5000 UNIT/ML IJ SOLN: 5000.0000 [IU] | Freq: Three times a day (TID) | INTRAMUSCULAR | Status: DC

## 2015-04-07 MED ORDER — INSULIN ASPART 100 UNIT/ML ~~LOC~~ SOLN: 0.0000 [IU] | Freq: Every day | SUBCUTANEOUS | Status: DC

## 2015-04-07 MED ORDER — SODIUM CHLORIDE 0.9 % IJ SOLN
3.0000 mL | Freq: Two times a day (BID) | INTRAMUSCULAR | Status: DC
  Administered 2015-04-07: 10 mL via INTRAVENOUS
  Administered 2015-04-07 – 2015-04-10 (×2): 3 mL via INTRAVENOUS

## 2015-04-07 MED ORDER — ONDANSETRON HCL 4 MG/2ML IJ SOLN: 4.0000 mg | Freq: Four times a day (QID) | INTRAMUSCULAR | Status: DC | PRN

## 2015-04-07 MED ORDER — HYDROCODONE-ACETAMINOPHEN 7.5-325 MG PO TABS
1.0000 | ORAL_TABLET | Freq: Three times a day (TID) | ORAL | Status: DC | PRN
  Administered 2015-04-07 – 2015-04-09 (×7): 1 via ORAL
  Filled 2015-04-07 (×8): qty 1

## 2015-04-07 MED ORDER — ASPIRIN EC 81 MG PO TBEC
81.0000 mg | DELAYED_RELEASE_TABLET | Freq: Every day | ORAL | Status: DC
  Administered 2015-04-07 – 2015-04-10 (×4): 81 mg via ORAL
  Filled 2015-04-07 (×6): qty 1

## 2015-04-07 MED ORDER — SODIUM CHLORIDE 0.9 % IV SOLN
INTRAVENOUS | Status: DC
  Administered 2015-04-07: 75 mL/h via INTRAVENOUS
  Administered 2015-04-08 – 2015-04-10 (×3): via INTRAVENOUS

## 2015-04-07 MED ORDER — ALPRAZOLAM 0.5 MG PO TABS
0.5000 mg | ORAL_TABLET | Freq: Once | ORAL | Status: AC
  Administered 2015-04-07: 0.5 mg via ORAL
  Filled 2015-04-07: qty 1

## 2015-04-07 MED ORDER — ONDANSETRON HCL 4 MG PO TABS: 4.0000 mg | ORAL_TABLET | Freq: Four times a day (QID) | ORAL | Status: DC | PRN

## 2015-04-07 MED ORDER — MIRTAZAPINE 15 MG PO TABS
15.0000 mg | ORAL_TABLET | Freq: Every day | ORAL | Status: DC
  Administered 2015-04-07 – 2015-04-09 (×3): 15 mg via ORAL
  Filled 2015-04-07 (×5): qty 1

## 2015-04-07 MED ORDER — POTASSIUM CHLORIDE CRYS ER 20 MEQ PO TBCR
40.0000 meq | EXTENDED_RELEASE_TABLET | Freq: Once | ORAL | Status: AC
  Administered 2015-04-07: 40 meq via ORAL
  Filled 2015-04-07: qty 2

## 2015-04-07 MED ORDER — VANCOMYCIN HCL IN DEXTROSE 1-5 GM/200ML-% IV SOLN
INTRAVENOUS | Status: AC
  Administered 2015-04-07: 1000 mg via INTRAVENOUS
  Filled 2015-04-07: qty 200

## 2015-04-07 MED ORDER — INSULIN ASPART 100 UNIT/ML ~~LOC~~ SOLN
0.0000 [IU] | Freq: Three times a day (TID) | SUBCUTANEOUS | Status: DC
  Administered 2015-04-07 – 2015-04-10 (×4): 2 [IU] via SUBCUTANEOUS
  Administered 2015-04-10: 3 [IU] via SUBCUTANEOUS
  Administered 2015-04-10: 2 [IU] via SUBCUTANEOUS

## 2015-04-07 NOTE — Progress Notes (Signed)
[facility]  Patient Status: Active (receiving services up to time of hospitalization)  [facility] is providing the following services: RN, PT and HHA  If patient discharges after hours, please call [phone].   Avie EchevariaKaren Nussbaum 04/07/2015, 12:17 PM

## 2015-04-07 NOTE — ED Notes (Signed)
Lactic acid drawn. Patient tolerated well. Family at bedside. Updated family on status of transfer.

## 2015-04-07 NOTE — Progress Notes (Signed)
I accepted [facility] . Patient has sepsis likely due to pneumonia. Patient started on abx. BP stable. Night MD will notified when patient arrives.

## 2015-04-07 NOTE — H&P (Signed)
[facility] History and Physical  Carrie Glenn SHF:[id number] DOB: [birth year] DOA: 04/07/2015  Referring physician: Selena Lesser PCP: Glendon Axe, MD   Chief Complaint: falls  HPI: Carrie Glenn is a 74 y.o. female  With PMHx of HTN, DM.  She was recently hospitalized at [facility] 6/11-6/15 for UTI/weakness.  SNF was recommended but family elected to take home instead.  Patient says she has been at home alone.  She has been unable to get up at night due to being too weak.  She has a hospital bed but she has been unable to pull herself up.  Per record, patient fell x 2 over the last 2 days.  It also states she has been hallucinating and having thick speech as well as leaning to the right.  Patient denies.  Patient does say she has had b/l knee pain since fall.    Says she does get cold but no chills, no fever, no headache, no chest pain, no cough No dysuria  She gets meals on wheels during the week.    In the ER, x rays were done of b/l knee and hip without fractures.  Labs showed only a low K.  Patient was transferred to [facility] from [place] at family's request.  No sign of infection in urine or on chest xray.     Review of Systems:  All systems reviewed, negative unless stated above   Past Medical History  Diagnosis Date  . Hypertension   . Diabetes mellitus without complication   . Pain     Chronic back  . Blind     Legally  . Hard of hearing   . Retinitis pigmentosa of both eyes   . Arthritis   . Osteoporosis    Past Surgical History  Procedure Laterality Date  . Hiatal hernia repair    . Left foot surgery  Left     Hammer toe fixation-2nd toe  . 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 She does not have any smokeless tobacco history on file. She reports that she does not drink alcohol. Her drug history is not on file.  Allergies  Allergen Reactions  . Amlodipine  Swelling  . Erythromycin Nausea Only  . Lexapro [Escitalopram Oxalate] Nausea Only  . Lipitor [Atorvastatin] Nausea And Vomiting  . Macrodantin [Nitrofurantoin Macrocrystal] Nausea Only  . Penicillins   . Sertraline Hcl Other (See Comments)    Patients gets jittery.  . Trazodone And Nefazodone Other (See Comments)    Patient gets insomnia.  . Amoxicillin Diarrhea, Nausea Only, Rash and Nausea And Vomiting  . Azithromycin Rash  . Effexor [Venlafaxine] Rash  . Metformin And Related Itching and Rash    Family Hx: HTN  Prior to Admission medications   Medication Sig Start Date End Date Taking? Authorizing Provider  acetaminophen (TYLENOL) 325 MG tablet Take 2 tablets (650 mg total) by mouth every 6 (six) hours as needed for mild pain (or Fever >/= 101). 03/12/15   Glendon Axe, MD  ALPRAZolam (XANAX XR) 0.5 MG 24 hr tablet Take 0.5 mg by mouth every morning.    Historical Provider, MD  aspirin EC 81 MG tablet Take 81 mg by mouth daily.    Historical Provider, MD  Blood Glucose Monitoring Suppl (ACCU-CHEK COMPACT CARE KIT) KIT 1 kit by Other route See admin instructions.    Historical Provider, MD  Calcium Carbonate-Vitamin D 600-400 MG-UNIT per tablet Take 1 tablet by mouth daily.    Historical Provider, MD  Cholecalciferol (VITAMIN D3) 2000 UNITS capsule Take 2,000 Units by mouth daily.    Historical Provider, MD  docusate sodium (COLACE) 100 MG capsule Take 1 capsule (100 mg total) by mouth 2 (two) times daily. 03/12/15   Glendon Axe, MD  fluticasone (FLONASE) 50 MCG/ACT nasal spray Place 2 sprays into the nose at bedtime as needed. 11/28/14   Historical Provider, MD  gabapentin (NEURONTIN) 300 MG capsule Take 300 mg by mouth at bedtime.     Historical Provider, MD  glucose blood test strip 1 each 2 (two) times daily.    Historical Provider, MD  hydrochlorothiazide (MICROZIDE) 12.5 MG capsule Take 12.5 mg by mouth daily.    Historical Provider, MD  HYDROcodone-acetaminophen (NORCO)  7.5-325 MG per tablet Take 1 tablet by mouth 3 (three) times daily as needed for moderate pain or severe pain.  05/06/15   Historical Provider, MD  ibuprofen (ADVIL,MOTRIN) 800 MG tablet Take 800 mg by mouth 2 (two) times daily.    Historical Provider, MD  isosorbide mononitrate (IMDUR) 30 MG 24 hr tablet Take 30 mg by mouth every morning. 08/01/14   Historical Provider, MD  levofloxacin (LEVAQUIN) 250 MG tablet Take 1 tablet (250 mg total) by mouth daily. 03/12/15   Glendon Axe, MD  metoprolol (LOPRESSOR) 50 MG tablet Take 75 mg by mouth 2 (two) times daily.    Historical Provider, MD  mirtazapine (REMERON) 15 MG tablet Take 15 mg by mouth at bedtime.    Historical Provider, MD  Multiple Vitamins-Minerals (CENTRUM SILVER) tablet Take 1 tablet by mouth daily.    Historical Provider, MD  olmesartan (BENICAR) 20 MG tablet Take 10 mg by mouth 2 (two) times daily.    Historical Provider, MD  omeprazole (PRILOSEC OTC) 20 MG tablet Take 20 mg by mouth 2 (two) times daily as needed.    Historical Provider, MD  rosuvastatin (CRESTOR) 10 MG tablet Take 10 mg by mouth 3 (three) times a week. Take on Monday, Wednesday, and Friday.    Historical Provider, MD  sitaGLIPtin (JANUVIA) 50 MG tablet Take 50 mg by mouth daily. 08/01/14   Historical Provider, MD  solifenacin (VESICARE) 5 MG tablet Take 5 mg by mouth every morning.    Historical Provider, MD   Physical Exam: Filed Vitals:   04/07/15 0915 04/07/15 0920  BP:  171/66  Pulse:  73  Temp:  97.6 F (36.4 C)  TempSrc:  Oral  Resp: 16 19  SpO2:  97%    Wt Readings from Last 3 Encounters:  04/06/15 72.122 kg (159 lb)  03/12/15 72.394 kg (159 lb 9.6 oz)    General:  Appears calm and comfortable- hard of hearing Eyes: PERRL, normal lids, irises & conjunctiva ENT: grossly normal hearing, lips & tongue Neck: no LAD, masses or thyromegaly Cardiovascular: RRR, no m/r/g. No LE edema. Respiratory: CTA bilaterally, no w/r/r. Normal respiratory  effort. Abdomen: soft, ntnd Skin: no rash or induration seen on limited exam Musculoskeletal: slightly weaker on left side Psychiatric: grossly normal mood and affect, speech fluent and appropriate Neurologic: mild weakness of left, no drift          Labs on Admission:  Basic Metabolic Panel:  Recent Labs Lab 04/06/15 2102  NA 139  K 3.1*  CL 103  CO2 27  GLUCOSE 144*  BUN 13  CREATININE 0.96  CALCIUM 8.6*   Liver Function  Tests:  Recent Labs Lab 04/06/15 2102  AST 20  ALT 8*  ALKPHOS 47  BILITOT 0.7  PROT 6.7  ALBUMIN 3.7   No results for input(s): LIPASE, AMYLASE in the last 168 hours. No results for input(s): AMMONIA in the last 168 hours. CBC:  Recent Labs Lab 04/06/15 2102  WBC 7.5  HGB 11.7*  HCT 35.1  MCV 94.6  PLT 101*   Cardiac Enzymes: No results for input(s): CKTOTAL, CKMB, CKMBINDEX, TROPONINI in the last 168 hours.  BNP (last 3 results) No results for input(s): BNP in the last 8760 hours.  ProBNP (last 3 results) No results for input(s): PROBNP in the last 8760 hours.  CBG:  Recent Labs Lab 04/06/15 2351  GLUCAP 148*    Radiological Exams on Admission: Dg Chest 2 View  04/07/2015   CLINICAL DATA:  Sepsis.  EXAM: CHEST  2 VIEW  COMPARISON:  03/08/2015  FINDINGS: Improved inspiration since previous study. Mild cardiac enlargement without vascular congestion. No focal airspace disease or consolidation. Esophageal hiatal hernia behind the heart. No blunting of costophrenic angles. No pneumothorax. Calcified and tortuous aorta. Postoperative changes in the cervical spine.  IMPRESSION: Cardiac enlargement without vascular congestion. No evidence of active pulmonary disease. Esophageal hiatal hernia.   Electronically Signed   By: Lucienne Capers M.D.   On: 04/07/2015 00:11   Dg Knee 2 Views Left  04/06/2015   CLINICAL DATA:  Altered mental status with falls yesterday and today. Left knee pain.  EXAM: LEFT KNEE - 1-2 VIEW  COMPARISON:   None.  FINDINGS: Tricompartment degenerative changes in the left knee with compartment narrowing and osteophytosis throughout. Chondral calcinosis. No evidence of acute fracture or dislocation. No focal bone lesion. No significant effusion.  IMPRESSION: Tricompartment degenerative changes in the left knee. No acute bony abnormalities.   Electronically Signed   By: Lucienne Capers M.D.   On: 04/06/2015 22:04   Dg Knee 2 Views Right  04/06/2015   CLINICAL DATA:  Altered mental status with falls yesterday and today. Pain in the knee. Visible swelling.  EXAM: RIGHT KNEE - 1-2 VIEW  COMPARISON:  03/12/2015  FINDINGS: Diffuse bone demineralization. Diffuse tricompartment degenerative changes throughout the right knee with medial and patellofemoral joint space narrowing and osteophytosis. Chondral calcinosis. No evidence of acute fracture or dislocation. No focal bone lesion. No significant effusion. Soft tissues are unremarkable. Similar appearance to prior study.  IMPRESSION: Prominent tricompartment degenerative changes in the right knee. No acute bony abnormalities.   Electronically Signed   By: Lucienne Capers M.D.   On: 04/06/2015 22:03   Ct Head Wo Contrast  04/06/2015   CLINICAL DATA:  Altered mental status, neck pain after multiple falls.  EXAM: CT HEAD WITHOUT CONTRAST  CT CERVICAL SPINE WITHOUT CONTRAST  TECHNIQUE: Multidetector CT imaging of the head and cervical spine was performed following the standard protocol without intravenous contrast. Multiplanar CT image reconstructions of the cervical spine were also generated.  COMPARISON:  None.  FINDINGS: CT HEAD FINDINGS  Bony calvarium appears intact. Mild diffuse cortical atrophy is noted. Minimal chronic ischemic white matter disease is noted. No mass effect or midline shift is noted. Ventricular size is within normal limits. There is no evidence of mass lesion, hemorrhage or acute infarction.  CT CERVICAL SPINE FINDINGS  Status post surgical anterior  fixation of C5-6. Severe degenerative disc disease is noted at C4-5 and C6-7. No fracture or spondylolisthesis is noted. Minimal degenerative changes seen involving posterior facet joints. Visualized  lung apices appear normal.  IMPRESSION: Mild diffuse cortical atrophy. Minimal chronic ischemic white matter disease. No acute intracranial abnormality seen.  Postsurgical and degenerative changes are noted as described above. No acute abnormality seen in the cervical spine.   Electronically Signed   By: Marijo Conception, M.D.   On: 04/06/2015 21:54   Ct Cervical Spine Wo Contrast  04/06/2015   CLINICAL DATA:  Altered mental status, neck pain after multiple falls.  EXAM: CT HEAD WITHOUT CONTRAST  CT CERVICAL SPINE WITHOUT CONTRAST  TECHNIQUE: Multidetector CT imaging of the head and cervical spine was performed following the standard protocol without intravenous contrast. Multiplanar CT image reconstructions of the cervical spine were also generated.  COMPARISON:  None.  FINDINGS: CT HEAD FINDINGS  Bony calvarium appears intact. Mild diffuse cortical atrophy is noted. Minimal chronic ischemic white matter disease is noted. No mass effect or midline shift is noted. Ventricular size is within normal limits. There is no evidence of mass lesion, hemorrhage or acute infarction.  CT CERVICAL SPINE FINDINGS  Status post surgical anterior fixation of C5-6. Severe degenerative disc disease is noted at C4-5 and C6-7. No fracture or spondylolisthesis is noted. Minimal degenerative changes seen involving posterior facet joints. Visualized lung apices appear normal.  IMPRESSION: Mild diffuse cortical atrophy. Minimal chronic ischemic white matter disease. No acute intracranial abnormality seen.  Postsurgical and degenerative changes are noted as described above. No acute abnormality seen in the cervical spine.   Electronically Signed   By: Marijo Conception, M.D.   On: 04/06/2015 21:54   Dg Hip Unilat With Pelvis 2-3 Views  Right  04/06/2015   CLINICAL DATA:  Right hip pain after fall today.  Initial encounter.  EXAM: DG HIP (WITH OR WITHOUT PELVIS) 2-3V RIGHT  COMPARISON:  None.  FINDINGS: There is no evidence of hip fracture or dislocation. There is no evidence of arthropathy or other focal bone abnormality.  IMPRESSION: Normal right hip.   Electronically Signed   By: Marijo Conception, M.D.   On: 04/06/2015 22:03    EKG: Independently reviewed. pending  Assessment/Plan Active Problems:   Weakness generalized   HTN (hypertension)   Diabetes   Weakness with fall -does not appear to have PNA or UTI- monitor off abx- blood cultures NGTD at [place] -MRI to r/o CVA -patient lives alone and has meals on wheels-- ? If patient is getting dehydrated due to not being able to get to bathroom/food -repeat EKG (sinus)- cycle CE -echo -TSH  HTN -PRN  Knee pain-PT eval -x ray ok  DM -SSI  Hypokalemia -replete   Suspect patient can not manage at home without more support as family does not stay with her- most likely will need SNF at d/c-- PT eval -does not need SDU bed- will transfer to tele bed  Code Status: full DVT Prophylaxis: Family Communication: no family at bedside Disposition Plan:   Time spent: 62 min  Eulogio Bear [facility] Pager [phone]
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
## 2015-04-07 NOTE — Progress Notes (Signed)
Admission note:   Arrival Method: Transfer from 2S. Mental Status: A&OX4. Telemetry: Placed on box #21.  Skin: Did not assess at this time.   Tubes: Foley catheter secured with stat lock. IV: Left hand NS@75ml /hr. Pain: Denies.  Family: Granddaughter at bedside. Living Situation: From home alone. Safety Measures: Bed alarm in place. 6E Orientation: Call bell within reach. Oriented to unit and surroundings.  Patient is legally blind and hard of hearing. Hearing aide present in left ear.   Larron Armor, BSN, RN-BC.

## 2015-04-07 NOTE — ED Notes (Signed)
Patient is resting quietly. Family has stepped out temporarily. Will continue to monitor and await placement at other facility.

## 2015-04-07 NOTE — ED Provider Notes (Signed)
-----------------------------------------   6:59 AM on 04/07/2015 -----------------------------------------  Chest x-ray (viewed by me, interpreted by Dr. Andria MeuseStevens): Cardiac enlargement without vascular congestion. No evidence of active pulmonary disease. Esophageal hiatal hernia.  No events overnight. [facility] which we are told will be available this morning. Patient is currently resting in no acute distress. Received empiric IV antibiotics in the emergency department. Vital signs stable.  Irean HongJade J Sung, MD 04/07/15 [phone]

## 2015-04-08 ENCOUNTER — Inpatient Hospital Stay (HOSPITAL_COMMUNITY): Payer: Medicare Other

## 2015-04-08 ENCOUNTER — Ambulatory Visit (HOSPITAL_COMMUNITY): Payer: Medicare Other

## 2015-04-08 DIAGNOSIS — R509 Fever, unspecified: Secondary | ICD-10-CM

## 2015-04-08 LAB — CBC
HEMATOCRIT: 36.7 % (ref 36.0–46.0)
Hemoglobin: 12.2 g/dL (ref 12.0–15.0)
MCH: 30.8 pg (ref 26.0–34.0)
MCHC: 33.2 g/dL (ref 30.0–36.0)
MCV: 92.7 fL (ref 78.0–100.0)
Platelets: 101 10*3/uL — ABNORMAL LOW (ref 150–400)
RBC: 3.96 MIL/uL (ref 3.87–5.11)
RDW: 12.4 % (ref 11.5–15.5)
WBC: 8.9 10*3/uL (ref 4.0–10.5)

## 2015-04-08 LAB — BLOOD GAS, ARTERIAL
Acid-base deficit: 1.9 mmol/L (ref 0.0–2.0)
Bicarbonate: 22.2 mEq/L (ref 20.0–24.0)
DRAWN BY: 44126
FIO2: 0.21 %
O2 SAT: 96.1 %
PATIENT TEMPERATURE: 98.6
PH ART: 7.401 (ref 7.350–7.450)
TCO2: 23.3 mmol/L (ref 0–100)
pCO2 arterial: 36.5 mmHg (ref 35.0–45.0)
pO2, Arterial: 71.7 mmHg — ABNORMAL LOW (ref 80.0–100.0)

## 2015-04-08 LAB — GLUCOSE, CAPILLARY
GLUCOSE-CAPILLARY: 114 mg/dL — AB (ref 65–99)
Glucose-Capillary: 121 mg/dL — ABNORMAL HIGH (ref 65–99)
Glucose-Capillary: 112 mg/dL — ABNORMAL HIGH (ref 65–99)
Glucose-Capillary: 104 mg/dL — ABNORMAL HIGH (ref 65–99)

## 2015-04-08 LAB — BASIC METABOLIC PANEL
Anion gap: 10 (ref 5–15)
BUN: 8 mg/dL (ref 6–20)
CO2: 23 mmol/L (ref 22–32)
Calcium: 8.8 mg/dL — ABNORMAL LOW (ref 8.9–10.3)
Chloride: 104 mmol/L (ref 101–111)
Creatinine, Ser: 0.77 mg/dL (ref 0.44–1.00)
GFR calc Af Amer: >60 mL/min (ref >60)
Glucose, Bld: 129 mg/dL — ABNORMAL HIGH (ref 65–99)
Potassium: 3.1 mmol/L — ABNORMAL LOW (ref 3.5–5.1)
Sodium: 137 mmol/L (ref 135–145)

## 2015-04-08 MED ORDER — POTASSIUM CHLORIDE CRYS ER 20 MEQ PO TBCR
40.0000 meq | EXTENDED_RELEASE_TABLET | Freq: Once | ORAL | Status: AC
Start: 2015-04-08 — End: 2015-04-08
  Administered 2015-04-08: 40 meq via ORAL
  Filled 2015-04-08: qty 2

## 2015-04-08 MED ORDER — VANCOMYCIN HCL IN DEXTROSE 750-5 MG/150ML-% IV SOLN
750.0000 mg | Freq: Two times a day (BID) | INTRAVENOUS | Status: DC
  Administered 2015-04-08 – 2015-04-10 (×5): 750 mg via INTRAVENOUS
  Filled 2015-04-08 (×6): qty 150

## 2015-04-08 MED ORDER — SODIUM CHLORIDE 0.9 % IV SOLN
500.0000 mg | Freq: Three times a day (TID) | INTRAVENOUS | Status: DC
  Administered 2015-04-08 – 2015-04-10 (×7): 500 mg via INTRAVENOUS
  Filled 2015-04-08 (×11): qty 500

## 2015-04-08 NOTE — Progress Notes (Signed)
ANTIBIOTIC CONSULT NOTE - INITIAL  Pharmacy Consult for Vancomycin and Imipenem/cilastin  Indication: HCAP  Allergies  Allergen Reactions  . Amlodipine Swelling  . Erythromycin Nausea Only  . Lexapro [Escitalopram Oxalate] Nausea Only  . Lipitor [Atorvastatin] Nausea And Vomiting  . Macrodantin [Nitrofurantoin Macrocrystal] Nausea Only  . Sertraline Hcl Other (See Comments)    Patients gets jittery.  . Trazodone And Nefazodone Other (See Comments)    Patient gets insomnia.  . Amoxicillin Diarrhea, Nausea And Vomiting, Nausea Only and Rash  . Azithromycin Rash  . Effexor [Venlafaxine] Rash  . Metformin And Related Itching and Rash  . Penicillins Rash    Patient Measurements: Height: 5\' 3"  (160 cm) Weight: 159 lb 6.3 oz (72.3 kg) IBW/kg (Calculated) : 52.4   Vital Signs: Temp: 98.5 F (36.9 C) (07/12 0959) Temp Source: Oral (07/12 0959) BP: 151/74 mmHg (07/12 0959) Pulse Rate: 72 (07/12 0959) Intake/Output from previous day: 07/11 0701 - 07/12 0700 In: 912.5 [I.V.:912.5] Out: 1160 [Urine:1160] Intake/Output from this shift: Total I/O In: 240 [P.O.:240] Out: -   Labs:  Recent Labs  04/06/15 2102 04/08/15 0433  WBC 7.5 8.9  HGB 11.7* 12.2  PLT 101* 101*  CREATININE 0.96 0.77   Estimated Creatinine Clearance: 57.9 mL/min (by C-G formula based on Cr of 0.77).  Microbiology: Recent Results (from the past 720 hour(s))  Blood culture (routine x 2)     Status: None (Preliminary result)   Collection Time: 04/06/15 10:47 PM  Result Value Ref Range Status   Specimen Description BLOOD PERIPHERAL  Final   Special Requests BOTTLES DRAWN AEROBIC AND ANAEROBIC 2CC  Final   Culture NO GROWTH 2 DAYS  Final   Report Status PENDING  Incomplete  Blood culture (routine x 2)     Status: None (Preliminary result)   Collection Time: 04/06/15 11:13 PM  Result Value Ref Range Status   Specimen Description BLOOD PERIPHERAL  Final   Special Requests BOTTLES DRAWN AEROBIC AND  ANAEROBIC 2CC  Final   Culture NO GROWTH 2 DAYS  Final   Report Status PENDING  Incomplete  MRSA PCR Screening     Status: None   Collection Time: 04/07/15  9:20 AM  Result Value Ref Range Status   MRSA by PCR NEGATIVE NEGATIVE Final    Comment:        The GeneXpert MRSA Assay (FDA approved for NASAL specimens only), is one component of a comprehensive MRSA colonization surveillance program. It is not intended to diagnose MRSA infection nor to guide or monitor treatment for MRSA infections.     Medical History: Past Medical History  Diagnosis Date  . Hypertension   . Diabetes mellitus without complication   . Pain     Chronic back  . Blind     Legally  . Hard of hearing   . Retinitis pigmentosa of both eyes   . Arthritis   . Osteoporosis    Assessment: 74 yo/ female presenting from SNF with c/o UTI/weakness. To begin vanc and primaxin for HCAP coverage   Goal of Therapy:  Vancomycin trough level 15-20 mcg/ml  Plan:  Vancomycin 750mg  IV Q12 hours Primaxin 500mg  IV Q8 hours  Follow renal function and cultures Follow vanc trough when appropriate    Darrel ReachMeagan C Nyle Limb, PharmD  Pharmacy Resident  04/08/2015,11:43 AM

## 2015-04-08 NOTE — Progress Notes (Addendum)
[facility] PROGRESS NOTE  Carrie Glenn ZOX:[id number] DOB: 1941-02-09 DOA: 04/07/2015 PCP: Leotis Shames, MD  Assessment/Plan: Active Problems:   Weakness generalized   HTN (hypertension)   Diabetes    Weakness with fall, due to HCAP , toxic encephalopathy Follow  blood cultures , started vanc and imipenem  -MRI negative for  CVA - -echo for possible syncopal episode  -TSH nl ,check, ABG   HTN -PRN  Knee pain-PT eval -x ray ok  DM -SSI  Hypokalemia -replete, check magnesium     Code Status:      Code Status Orders        Start     Ordered   04/07/15 1057  Full code   Continuous     04/07/15 1056     Family Communication: family updated about patient's clinical progress Disposition Plan:  As above    Brief narrative: Carrie Glenn is a 74 y.o. female  With PMHx of HTN, DM. She was recently hospitalized at [place] [facility] 6/11-6/15 for UTI/weakness. SNF was recommended but family elected to take home instead. Patient says she has been at home alone. She has been unable to get up at night due to being too weak. She has a hospital bed but she has been unable to pull herself up. Per record, patient fell x 2 over the last 2 days. It also states she has been hallucinating and having thick speech as well as leaning to the right. Patient denies. Patient does say she has had b/l knee pain since fall.  Says she does get cold but no chills, no fever, no headache, no chest pain, no cough No dysuria  She gets meals on wheels during the week.   In the ER, x rays were done of b/l knee and hip without fractures. Labs showed only a low K. Patient was transferred to [facility] from [place] at family's request. No sign of infection in urine or on chest xray  Consultants:  None   Procedures:  None   Antibiotics: Anti-infectives    None         HPI/Subjective: Opens her eyes to command , left leg pain from her fall  Objective: Filed  Vitals:   04/07/15 1841 04/07/15 2114 04/08/15 0411 04/08/15 0659  BP: 156/70 177/81 159/68   Pulse: 93 108 74   Temp: 98.2 F (36.8 C) 98.2 F (36.8 C) 100.5 F (38.1 C) 98.9 F (37.2 C)  TempSrc: Oral Oral Oral Oral  Resp: 18 16 16    Height:      Weight:  72.3 kg (159 lb 6.3 oz)    SpO2: 96% 100% 97%     Intake/Output Summary (Last 24 hours) at 04/08/15 8119 Last data filed at 04/08/15 1478  Gross per 24 hour  Intake  912.5 ml  Output   1110 ml  Net -197.5 ml    Exam:  General: somnolent  Lungs: Clear to auscultation bilaterally without wheezes or crackles Cardiovascular: Regular rate and rhythm without murmur gallop or rub normal S1 and S2 Abdomen: Nontender, nondistended, soft, bowel sounds positive, no rebound, no ascites, no appreciable mass Extremities: No significant cyanosis, clubbing, or edema bilateral lower extremities     Data Review   Micro Results Recent Results (from the past 240 hour(s))  Blood culture (routine x 2)     Status: None (Preliminary result)   Collection Time: 04/06/15 10:47 PM  Result Value Ref Range Status   Specimen Description BLOOD  PERIPHERAL  Final   Special Requests BOTTLES DRAWN AEROBIC AND ANAEROBIC 2CC  Final   Culture NO GROWTH 2 DAYS  Final   Report Status PENDING  Incomplete  Blood culture (routine x 2)     Status: None (Preliminary result)   Collection Time: 04/06/15 11:13 PM  Result Value Ref Range Status   Specimen Description BLOOD PERIPHERAL  Final   Special Requests BOTTLES DRAWN AEROBIC AND ANAEROBIC 2CC  Final   Culture NO GROWTH 2 DAYS  Final   Report Status PENDING  Incomplete  MRSA PCR Screening     Status: None   Collection Time: 04/07/15  9:20 AM  Result Value Ref Range Status   MRSA by PCR NEGATIVE NEGATIVE Final    Comment:        The GeneXpert MRSA Assay (FDA approved for NASAL specimens only), is one component of a comprehensive MRSA colonization surveillance program. It is not intended to  diagnose MRSA infection nor to guide or monitor treatment for MRSA infections.     Radiology Reports Dg Chest 2 View  04/07/2015   CLINICAL DATA:  Sepsis.  EXAM: CHEST  2 VIEW  COMPARISON:  03/08/2015  FINDINGS: Improved inspiration since previous study. Mild cardiac enlargement without vascular congestion. No focal airspace disease or consolidation. Esophageal hiatal hernia behind the heart. No blunting of costophrenic angles. No pneumothorax. Calcified and tortuous aorta. Postoperative changes in the cervical spine.  IMPRESSION: Cardiac enlargement without vascular congestion. No evidence of active pulmonary disease. Esophageal hiatal hernia.   Electronically Signed   By: Burman Nieves M.D.   On: 04/07/2015 00:11   Dg Knee 2 Views Left  04/06/2015   CLINICAL DATA:  Altered mental status with falls yesterday and today. Left knee pain.  EXAM: LEFT KNEE - 1-2 VIEW  COMPARISON:  None.  FINDINGS: Tricompartment degenerative changes in the left knee with compartment narrowing and osteophytosis throughout. Chondral calcinosis. No evidence of acute fracture or dislocation. No focal bone lesion. No significant effusion.  IMPRESSION: Tricompartment degenerative changes in the left knee. No acute bony abnormalities.   Electronically Signed   By: Burman Nieves M.D.   On: 04/06/2015 22:04   Dg Knee 2 Views Right  04/06/2015   CLINICAL DATA:  Altered mental status with falls yesterday and today. Pain in the knee. Visible swelling.  EXAM: RIGHT KNEE - 1-2 VIEW  COMPARISON:  03/12/2015  FINDINGS: Diffuse bone demineralization. Diffuse tricompartment degenerative changes throughout the right knee with medial and patellofemoral joint space narrowing and osteophytosis. Chondral calcinosis. No evidence of acute fracture or dislocation. No focal bone lesion. No significant effusion. Soft tissues are unremarkable. Similar appearance to prior study.  IMPRESSION: Prominent tricompartment degenerative changes in the  right knee. No acute bony abnormalities.   Electronically Signed   By: Burman Nieves M.D.   On: 04/06/2015 22:03   Dg Knee 1-2 Views Right  03/12/2015   CLINICAL DATA:  Twisting injury of right knee yesterday. Right knee pain and swelling. Initial encounter.  EXAM: RIGHT KNEE - 1-2 VIEW  COMPARISON:  None  FINDINGS: There is no evidence of fracture, dislocation, or joint effusion.  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 Chondrocalcinosis also noted. No other significant osseous abnormality identified. Mild peripheral vascular calcification noted.  IMPRESSION: No acute findings.  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 Mild chondrocalcinosis also noted.   Electronically Signed   By: Myles Rosenthal M.D.   On:  03/12/2015 12:33   Ct Head Wo Contrast  04/06/2015   CLINICAL DATA:  Altered mental status, neck pain after multiple falls.  EXAM: CT HEAD WITHOUT CONTRAST  CT CERVICAL SPINE WITHOUT CONTRAST  TECHNIQUE: Multidetector CT imaging of the head and cervical spine was performed following the standard protocol without intravenous contrast. Multiplanar CT image reconstructions of the cervical spine were also generated.  COMPARISON:  None.  FINDINGS: CT HEAD FINDINGS  Bony calvarium appears intact. Mild diffuse cortical atrophy is noted. Minimal chronic ischemic white matter disease is noted. No mass effect or midline shift is noted. Ventricular size is within normal limits. There is no evidence of mass lesion, hemorrhage or acute infarction.  CT CERVICAL SPINE FINDINGS  Status post surgical anterior fixation of C5-6. Severe degenerative disc disease is noted at C4-5 and C6-7. No fracture or spondylolisthesis is noted. Minimal degenerative changes seen involving posterior facet joints. Visualized lung apices appear normal.  IMPRESSION: Mild diffuse cortical atrophy. Minimal chronic ischemic white matter  disease. No acute intracranial abnormality seen.  Postsurgical and degenerative changes are noted as described above. No acute abnormality seen in the cervical spine.   Electronically Signed   By: Lupita Raider, M.D.   On: 04/06/2015 21:54   Ct Cervical Spine Wo Contrast  04/06/2015   CLINICAL DATA:  Altered mental status, neck pain after multiple falls.  EXAM: CT HEAD WITHOUT CONTRAST  CT CERVICAL SPINE WITHOUT CONTRAST  TECHNIQUE: Multidetector CT imaging of the head and cervical spine was performed following the standard protocol without intravenous contrast. Multiplanar CT image reconstructions of the cervical spine were also generated.  COMPARISON:  None.  FINDINGS: CT HEAD FINDINGS  Bony calvarium appears intact. Mild diffuse cortical atrophy is noted. Minimal chronic ischemic white matter disease is noted. No mass effect or midline shift is noted. Ventricular size is within normal limits. There is no evidence of mass lesion, hemorrhage or acute infarction.  CT CERVICAL SPINE FINDINGS  Status post surgical anterior fixation of C5-6. Severe degenerative disc disease is noted at C4-5 and C6-7. No fracture or spondylolisthesis is noted. Minimal degenerative changes seen involving posterior facet joints. Visualized lung apices appear normal.  IMPRESSION: Mild diffuse cortical atrophy. Minimal chronic ischemic white matter disease. No acute intracranial abnormality seen.  Postsurgical and degenerative changes are noted as described above. No acute abnormality seen in the cervical spine.   Electronically Signed   By: Lupita Raider, M.D.   On: 04/06/2015 21:54   Mr Brain Wo Contrast  04/07/2015   CLINICAL DATA:  Fall with trauma to the head. Subsequent altered mental status with hallucinations.  EXAM: MRI HEAD WITHOUT CONTRAST  TECHNIQUE: Multiplanar, multiecho pulse sequences of the brain and surrounding structures were obtained without intravenous contrast.  COMPARISON:  Head CT 04/06/2015  FINDINGS:  Diffusion imaging does not show any acute or subacute infarction. The brainstem is normal. 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 There are a few old small vessel infarctions affecting the thalami and basal ganglia. There are moderate chronic small-vessel ischemic changes affecting the deep and subcortical hemispheric white matter. No cortical or large vessel territory infarction. No mass lesion, acute hemorrhage, hydrocephalus or extra-axial collection. No pituitary mass. No inflammatory sinus disease. There are small mastoid effusions bilaterally. No skull or skullbase lesion.  IMPRESSION: No acute or reversible process. Old small vessel insults throughout the brain as outlined above.   Electronically Signed   By: Paulina Fusi  M.D.   On: 04/07/2015 13:36   Dg Chest Port 1 View  04/08/2015   CLINICAL DATA:  Fever, diabetes  EXAM: PORTABLE CHEST - 1 VIEW  COMPARISON:  PA and lateral chest x-ray of April 06, 2015  FINDINGS: The lungs are hypoinflated today. The lung markings are coarse at both bases. The left hemidiaphragm is obscured. The cardiac silhouette remains enlarged. The pulmonary vascularity exhibits crowding but no cephalization. The trachea is midline. The bony thorax is unremarkable.  IMPRESSION: Bilateral hypoinflation. Left lower lobe atelectasis/pneumonia may be developing.   Electronically Signed   By: David  Swaz[place] M.D.   On: 04/08/2015 09:16   Dg Hip Unilat With Pelvis 2-3 Views Right  04/06/2015   CLINICAL DATA:  Right hip pain after fall today.  Initial encounter.  EXAM: DG HIP (WITH OR WITHOUT PELVIS) 2-3V RIGHT  COMPARISON:  None.  FINDINGS: There is no evidence of hip fracture or dislocation. There is no evidence of arthropathy or other focal bone abnormality.  IMPRESSION: Normal right hip.   Electronically Signed   By: Lupita Raider, M.D.   On: 04/06/2015 22:03     CBC  Recent Labs Lab 04/06/15 2102 04/08/15 0433  WBC 7.5 8.9  HGB 11.7*  12.2  HCT 35.1 36.7  PLT 101* 101*  MCV 94.6 92.7  MCH 31.6 30.8  MCHC 33.4 33.2  RDW 13.1 12.4    Chemistries   Recent Labs Lab 04/06/15 2102 04/08/15 0433  NA 139 137  K 3.1* 3.1*  CL 103 104  CO2 27 23  GLUCOSE 144* 129*  BUN 13 8  CREATININE 0.96 0.77  CALCIUM 8.6* 8.8*  AST 20  --   ALT 8*  --   ALKPHOS 47  --   BILITOT 0.7  --    ------------------------------------------------------------------------------------------------------------------ estimated creatinine clearance is 57.9 mL/min (by C-G formula based on Cr of 0.77). ------------------------------------------------------------------------------------------------------------------ No results for input(s): HGBA1C in the last 72 hours. ------------------------------------------------------------------------------------------------------------------ No results for input(s): CHOL, HDL, LDLCALC, TRIG, CHOLHDL, LDLDIRECT in the last 72 hours. ------------------------------------------------------------------------------------------------------------------  Recent Labs  04/07/15 1421  TSH 0.632   ------------------------------------------------------------------------------------------------------------------ No results for input(s): VITAMINB12, FOLATE, FERRITIN, TIBC, IRON, RETICCTPCT in the last 72 hours.  Coagulation profile No results for input(s): INR, PROTIME in the last 168 hours.  No results for input(s): DDIMER in the last 72 hours.  Cardiac Enzymes  Recent Labs Lab 04/07/15 1421 04/07/15 1648 04/07/15 2228  TROPONINI <0.03 <0.03 <0.03   ------------------------------------------------------------------------------------------------------------------ Invalid input(s): POCBNP   CBG:  Recent Labs Lab 04/07/15 0846 04/07/15 1125 04/07/15 1650 04/07/15 2109 04/08/15 0742  GLUCAP 106* 133* 144* 120* 121*       Studies: Dg Chest 2 View  04/07/2015   CLINICAL DATA:  Sepsis.  EXAM:  CHEST  2 VIEW  COMPARISON:  03/08/2015  FINDINGS: Improved inspiration since previous study. Mild cardiac enlargement without vascular congestion. No focal airspace disease or consolidation. Esophageal hiatal hernia behind the heart. No blunting of costophrenic angles. No pneumothorax. Calcified and tortuous aorta. Postoperative changes in the cervical spine.  IMPRESSION: Cardiac enlargement without vascular congestion. No evidence of active pulmonary disease. Esophageal hiatal hernia.   Electronically Signed   By: Burman Nieves M.D.   On: 04/07/2015 00:11   Dg Knee 2 Views Left  04/06/2015   CLINICAL DATA:  Altered mental status with falls yesterday and today. Left knee pain.  EXAM: LEFT KNEE - 1-2 VIEW  COMPARISON:  None.  FINDINGS: Tricompartment degenerative changes in  the left knee with compartment narrowing and osteophytosis throughout. Chondral calcinosis. No evidence of acute fracture or dislocation. No focal bone lesion. No significant effusion.  IMPRESSION: Tricompartment degenerative changes in the left knee. No acute bony abnormalities.   Electronically Signed   By: Burman Nieves M.D.   On: 04/06/2015 22:04   Dg Knee 2 Views Right  04/06/2015   CLINICAL DATA:  Altered mental status with falls yesterday and today. Pain in the knee. Visible swelling.  EXAM: RIGHT KNEE - 1-2 VIEW  COMPARISON:  03/12/2015  FINDINGS: Diffuse bone demineralization. Diffuse tricompartment degenerative changes throughout the right knee with medial and patellofemoral joint space narrowing and osteophytosis. Chondral calcinosis. No evidence of acute fracture or dislocation. No focal bone lesion. No significant effusion. Soft tissues are unremarkable. Similar appearance to prior study.  IMPRESSION: Prominent tricompartment degenerative changes in the right knee. No acute bony abnormalities.   Electronically Signed   By: Burman Nieves M.D.   On: 04/06/2015 22:03   Ct Head Wo Contrast  04/06/2015   CLINICAL DATA:   Altered mental status, neck pain after multiple falls.  EXAM: CT HEAD WITHOUT CONTRAST  CT CERVICAL SPINE WITHOUT CONTRAST  TECHNIQUE: Multidetector CT imaging of the head and cervical spine was performed following the standard protocol without intravenous contrast. Multiplanar CT image reconstructions of the cervical spine were also generated.  COMPARISON:  None.  FINDINGS: CT HEAD FINDINGS  Bony calvarium appears intact. Mild diffuse cortical atrophy is noted. Minimal chronic ischemic white matter disease is noted. No mass effect or midline shift is noted. Ventricular size is within normal limits. There is no evidence of mass lesion, hemorrhage or acute infarction.  CT CERVICAL SPINE FINDINGS  Status post surgical anterior fixation of C5-6. Severe degenerative disc disease is noted at C4-5 and C6-7. No fracture or spondylolisthesis is noted. Minimal degenerative changes seen involving posterior facet joints. Visualized lung apices appear normal.  IMPRESSION: Mild diffuse cortical atrophy. Minimal chronic ischemic white matter disease. No acute intracranial abnormality seen.  Postsurgical and degenerative changes are noted as described above. No acute abnormality seen in the cervical spine.   Electronically Signed   By: Lupita Raider, M.D.   On: 04/06/2015 21:54   Ct Cervical Spine Wo Contrast  04/06/2015   CLINICAL DATA:  Altered mental status, neck pain after multiple falls.  EXAM: CT HEAD WITHOUT CONTRAST  CT CERVICAL SPINE WITHOUT CONTRAST  TECHNIQUE: Multidetector CT imaging of the head and cervical spine was performed following the standard protocol without intravenous contrast. Multiplanar CT image reconstructions of the cervical spine were also generated.  COMPARISON:  None.  FINDINGS: CT HEAD FINDINGS  Bony calvarium appears intact. Mild diffuse cortical atrophy is noted. Minimal chronic ischemic white matter disease is noted. No mass effect or midline shift is noted. Ventricular size is within normal  limits. There is no evidence of mass lesion, hemorrhage or acute infarction.  CT CERVICAL SPINE FINDINGS  Status post surgical anterior fixation of C5-6. Severe degenerative disc disease is noted at C4-5 and C6-7. No fracture or spondylolisthesis is noted. Minimal degenerative changes seen involving posterior facet joints. Visualized lung apices appear normal.  IMPRESSION: Mild diffuse cortical atrophy. Minimal chronic ischemic white matter disease. No acute intracranial abnormality seen.  Postsurgical and degenerative changes are noted as described above. No acute abnormality seen in the cervical spine.   Electronically Signed   By: Lupita Raider, M.D.   On: 04/06/2015 21:54   Mr Brain Ilda Basset  Contrast  04/07/2015   CLINICAL DATA:  Fall with trauma to the head. Subsequent altered mental status with hallucinations.  EXAM: MRI HEAD WITHOUT CONTRAST  TECHNIQUE: Multiplanar, multiecho pulse sequences of the brain and surrounding structures were obtained without intravenous contrast.  COMPARISON:  Head CT 04/06/2015  FINDINGS: Diffusion imaging does not show any acute or subacute infarction. The brainstem is normal. 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 There are a few old small vessel infarctions affecting the thalami and basal ganglia. There are moderate chronic small-vessel ischemic changes affecting the deep and subcortical hemispheric white matter. No cortical or large vessel territory infarction. No mass lesion, acute hemorrhage, hydrocephalus or extra-axial collection. No pituitary mass. No inflammatory sinus disease. There are small mastoid effusions bilaterally. No skull or skullbase lesion.  IMPRESSION: No acute or reversible process. Old small vessel insults throughout the brain as outlined above.   Electronically Signed   By: Paulina FusiMark  Shogry M.D.   On: 04/07/2015 13:36   Dg Chest Port 1 View  04/08/2015   CLINICAL DATA:  Fever, diabetes  EXAM: PORTABLE CHEST - 1 VIEW   COMPARISON:  PA and lateral chest x-ray of April 06, 2015  FINDINGS: The lungs are hypoinflated today. The lung markings are coarse at both bases. The left hemidiaphragm is obscured. The cardiac silhouette remains enlarged. The pulmonary vascularity exhibits crowding but no cephalization. The trachea is midline. The bony thorax is unremarkable.  IMPRESSION: Bilateral hypoinflation. Left lower lobe atelectasis/pneumonia may be developing.   Electronically Signed   By: David  [place]azil[place] M.D.   On: 04/08/2015 09:16   Dg Hip Unilat With Pelvis 2-3 Views Right  04/06/2015   CLINICAL DATA:  Right hip pain after fall today.  Initial encounter.  EXAM: DG HIP (WITH OR WITHOUT PELVIS) 2-3V RIGHT  COMPARISON:  None.  FINDINGS: There is no evidence of hip fracture or dislocation. There is no evidence of arthropathy or other focal bone abnormality.  IMPRESSION: Normal right hip.   Electronically Signed   By: Lupita RaiderJames  Green Jr, M.D.   On: 04/06/2015 22:03      No results found for: HGBA1C Lab Results  Component Value Date   CREATININE 0.77 04/08/2015       Scheduled Meds: . aspirin EC  81 mg Oral Daily  . heparin  5,000 Units Subcutaneous 3 times per day  . insulin aspart  0-15 Units Subcutaneous TID WC  . insulin aspart  0-5 Units Subcutaneous QHS  . metoprolol  75 mg Oral BID  . mirtazapine  15 mg Oral QHS  . potassium chloride  40 mEq Oral Once  . sodium chloride  3 mL Intravenous Q12H   Continuous Infusions: . sodium chloride 75 mL/hr at 04/08/15 [id number]    Active Problems:   Weakness generalized   HTN (hypertension)   Diabetes    Time spent: 45 minutes   [facility] LLCBROL,Talley Casco  [facility] Pager [phone]. If 7PM-7AM, please contact night-coverage at [email or web address], password [email or web address] 04/08/2015, 9:52 AM  LOS: 1 day

## 2015-04-08 NOTE — Progress Notes (Signed)
  Echocardiogram 2D Echocardiogram has been performed.  Carrie SavoyCasey N Davione Lenker 04/08/2015, 4:42 PM

## 2015-04-08 NOTE — Evaluation (Signed)
Clinical/Bedside Swallow Evaluation Patient Details  Name: Carrie Glenn MRN: [id number] Date of Birth: 06/30/1941  Today's Date: 04/08/2015 Time: SLP Start Time (ACUTE ONLY): 1013 SLP Stop Time (ACUTE ONLY): 1032 SLP Time Calculation (min) (ACUTE ONLY): 19 min  Past Medical History:  Past Medical History  Diagnosis Date  . Hypertension   . Diabetes mellitus without complication   . Pain     Chronic back  . Blind     Legally  . Hard of hearing   . Retinitis pigmentosa of both eyes   . Arthritis   . Osteoporosis    Past Surgical History:  Past Surgical History  Procedure Laterality Date  . Hiatal hernia repair    . Left foot surgery  Left     Hammer toe fixation-2nd toe  . 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 female admitted with weakness after falls,  HCAP , toxic encephalopathy.    Assessment / Plan / Recommendation Clinical Impression  Pt groggy, requiring cues to maintain wakeful state, but presented with functional oropharyngeal swallow with adequate mastication, brisk swallow response, and adequate airway protection.  No SLP f/u is warranted.  Continue regular diet, thin liquids.  If meds in water elicits cough, then give whole in puree.       Aspiration Risk  Mild    Diet Recommendation Thin (regular)   Medication Administration: Whole meds with liquid    Other  Recommendations Oral Care Recommendations: Oral care BID   Follow Up Recommendations    no f/u     Swallow Study Prior Functional Status       General Date of Onset: 04/08/15 Other Pertinent Information: 74 y.o. female admitted with weakness after falls,  HCAP , toxic encephalopathy Type of Study: Bedside swallow evaluation Previous Swallow Assessment: none Diet Prior to this Study: Regular;Thin liquids Temperature Spikes Noted: No Respiratory Status: Room air History of Recent Intubation: No Behavior/Cognition:  Alert;Lethargic/Drowsy Oral Cavity - Dentition: Missing dentition Self-Feeding Abilities: Able to feed self;Needs assist Patient Positioning: Upright in bed Baseline Vocal Quality: Normal Volitional Cough: Strong Volitional Swallow: Able to elicit    Oral/Motor/Sensory Function Overall Oral Motor/Sensory Function: Appears within functional limits for tasks assessed   Ice Chips Ice chips: Not tested   Thin Liquid Thin Liquid: Within functional limits Presentation: Cup;Straw    Nectar Thick Nectar Thick Liquid: Not tested   Honey Thick Honey Thick Liquid: Not tested   Puree Puree: Within functional limits   Solid  Carrie Mulkern L. [place] Beachouture, KentuckyMA CCC/SLP Pager [phone]     Solid: Within functional limits       Blenda MountsCouture, Carrie Glenn 04/08/2015,10:33 AM

## 2015-04-08 NOTE — Evaluation (Signed)
Physical Therapy Evaluation Patient Details Name: Carrie Glenn MRN: [id number] DOB: Oct 21, 1940 Today's Date: 04/08/2015   History of Present Illness  74 y.o. female adm due to recent falls and B knee pain. Pt was recently hospitalized due to UTI and weakness, recommened SNF but pt insisted on D/C home. Pt has been unable to ambulate to/from bathroom at home.  Pt Dx with HCAP and acute encehpalopathy   Clinical Impression  Pt adm due to above. Pt very difficult to arouse and maintain attention. Pt greatly limited by AMS and c/o pain during movement of Rt LE. Pt to benefit from skilled acute PT to address deficits and maximize functional mobility. Recommend SNF due to multiple hospitalizations and lack of 24/7 (A) at home.   Follow Up Recommendations SNF;Supervision/Assistance - 24 hour    Equipment Recommendations  None recommended by PT    Recommendations for Other Services Speech consult     Precautions / Restrictions Precautions Precautions: Fall Precaution Comments: AMS; xrays negative for fx in knee Restrictions Weight Bearing Restrictions: No      Mobility  Bed Mobility Overal bed mobility: Needs Assistance;+2 for physical assistance Bed Mobility: Supine to Sit;Rolling;Sit to Supine Rolling: Mod assist   Supine to sit: Mod assist;+2 for physical assistance;HOB elevated Sit to supine: +2 for physical assistance;Mod assist   General bed mobility comments: grimaced in pain with touch and movement of Rt LE; use of draw pad to bring hips around to sitting position at EOB; max multimodal cues with use over hand over hand technique ; attempted to (A) with movement but greatly limited   Transfers                 General transfer comment: pt unable to WB through Rt LE at this time due to pain to safely assess  Ambulation/Gait                Stairs            Wheelchair Mobility    Modified Rankin (Stroke Patients Only)       Balance Overall balance  assessment: Needs assistance;History of Falls Sitting-balance support: Feet supported;Single extremity supported Sitting balance-Leahy Scale: Poor Sitting balance - Comments: pt tolerated sitting EOB 10 min; able to progress to supervision with use of UE support; heavy lean to right  Postural control: Posterior lean;Right lateral lean                                   Pertinent Vitals/Pain Pain Assessment: Faces Faces Pain Scale: Hurts whole lot Pain Location: Rt knee> Lt knee Pain Descriptors / Indicators: Grimacing;Moaning Pain Intervention(s): Limited activity within patient's tolerance;Repositioned;Monitored during session    Home Living Family/patient expects to be discharged to:: Skilled nursing facility                 Additional Comments: daughter present and agreeable to SNF    Prior Function Level of Independence: Needs assistance   Gait / Transfers Assistance Needed: pt has had difficulty ambulating at home and getting OOB  ADL's / Homemaking Assistance Needed: daughter (A); meals on wheels        Hand Dominance   Dominant Hand: Right    Extremity/Trunk Assessment   Upper Extremity Assessment: Defer to OT evaluation           Lower Extremity Assessment: Generalized weakness      Cervical / Trunk Assessment:  Normal  Communication   Communication: HOH;Expressive difficulties  Cognition Arousal/Alertness: Lethargic;Suspect due to medications Behavior During Therapy: Flat affect Overall Cognitive Status: Impaired/Different from baseline Area of Impairment: Orientation;Attention;Memory;Following commands;Safety/judgement;Problem solving;Awareness Orientation Level: Disoriented to;Time;Place;Situation Current Attention Level: Focused Memory: Decreased short-term memory Following Commands: Follows one step commands inconsistently Safety/Judgement: Decreased awareness of safety;Decreased awareness of deficits Awareness:  Intellectual Problem Solving: Slow processing;Decreased initiation;Requires tactile cues;Requires verbal cues;Difficulty sequencing General Comments: daughter present; max redirection for cues; heavy gaze to Rt and lean to right ; difficulty with word finding    General Comments General comments (skin integrity, edema, etc.): B LEs elevated     Exercises General Exercises - Lower Extremity Ankle Circles/Pumps: Both;10 reps;Supine;AAROM Long Arc Quad: AAROM;Both;5 reps;Limitations Long Arc Quad Limitations: pain      Assessment/Plan    PT Assessment Patient needs continued PT services  PT Diagnosis Difficulty walking;Generalized weakness;Acute pain   PT Problem List Decreased strength;Decreased range of motion;Decreased activity tolerance;Decreased balance;Decreased mobility;Decreased cognition;Decreased knowledge of use of DME;Decreased safety awareness;Decreased knowledge of precautions;Pain  PT Treatment Interventions DME instruction;Gait training;Therapeutic exercise;Therapeutic activities;Functional mobility training;Balance training;Neuromuscular re-education;Patient/family education   PT Goals (Current goals can be found in the Care Plan section) Acute Rehab PT Goals Patient Stated Goal: daughter's goal " for my mom to get better" PT Goal Formulation: With patient/family Time For Goal Achievement: 04/22/15 Potential to Achieve Goals: Fair    Frequency Min 2X/week   Barriers to discharge Decreased caregiver support;Inaccessible home environment pt was living at home with daughter in/out    Co-evaluation               End of Session   Activity Tolerance: Patient limited by lethargy;Patient limited by pain Patient left: in bed;with call bell/phone within reach;with bed alarm set Nurse Communication: Mobility status;Precautions         Time: 0929-1000 PT Time Calculation (min) (ACUTE ONLY): 31 min   Charges:   PT Evaluation $Initial PT Evaluation Tier I: 1  Procedure PT Treatments $Therapeutic Activity: 8-22 mins   PT G CodesDonell Sievert:        Tachina Spoonemore N PT [phone] 04/08/2015, 10:15 AM

## 2015-04-08 NOTE — Progress Notes (Signed)
Chaplain Note:   Chaplain responded to consult.   Pt had plenty to share concerning her feelings about her mother some events in her own life.   Pt daughter expressed having a terrible experience [facility] and noted [facility] has been nothing short of awesome; a stark contrast from [place].   Pt daughter presents and visibly stressed and highly anxious. Pt was sleeping throughout the duration of the visit. Pt daughter expressed that she finds comfort in finally having a diagnosis however her emotional energy is still turbulent.   Pt daughter is very spiritual as she expressed that she has been anointing her mother daily and having consistent high energy moments of prayer.   She is hopeful that the situation will turn around for the better.   Chaplain provided emotional support and empathic listening.   Gala RomneyBrown, Dessie Tatem J, Chaplain 04/08/2015 11:45 AM

## 2015-04-09 DIAGNOSIS — E876 Hypokalemia: Secondary | ICD-10-CM

## 2015-04-09 DIAGNOSIS — J189 Pneumonia, unspecified organism: Secondary | ICD-10-CM

## 2015-04-09 DIAGNOSIS — G9340 Encephalopathy, unspecified: Secondary | ICD-10-CM

## 2015-04-09 DIAGNOSIS — R296 Repeated falls: Secondary | ICD-10-CM

## 2015-04-09 DIAGNOSIS — R531 Weakness: Secondary | ICD-10-CM

## 2015-04-09 HISTORY — DX: Repeated falls: R29.6

## 2015-04-09 HISTORY — DX: Pneumonia, unspecified organism: J18.9

## 2015-04-09 HISTORY — DX: Encephalopathy, unspecified: G93.40

## 2015-04-09 LAB — GLUCOSE, CAPILLARY
GLUCOSE-CAPILLARY: 132 mg/dL — AB (ref 65–99)
Glucose-Capillary: 117 mg/dL — ABNORMAL HIGH (ref 65–99)
Glucose-Capillary: 146 mg/dL — ABNORMAL HIGH (ref 65–99)
Glucose-Capillary: 120 mg/dL — ABNORMAL HIGH (ref 65–99)

## 2015-04-09 LAB — MAGNESIUM: Magnesium: 1.6 mg/dL — ABNORMAL LOW (ref 1.7–2.4)

## 2015-04-09 MED ORDER — POTASSIUM CHLORIDE CRYS ER 20 MEQ PO TBCR
40.0000 meq | EXTENDED_RELEASE_TABLET | ORAL | Status: AC
  Administered 2015-04-09 (×2): 40 meq via ORAL
  Filled 2015-04-09: qty 2

## 2015-04-09 NOTE — Care Management Important Message (Signed)
Important Message  Patient Details  Name: Carrie Glenn MRN: [id number] Date of Birth: 07/12/1941   Medicare Important Message Given:  Yes-second notification given    Orson AloeMegan P Verania Salberg 04/09/2015, 11:45 AM

## 2015-04-09 NOTE — Clinical Social Work Note (Signed)
Physical therapy recommending SNF for short-term rehab. CSW attempted to reach patient's daughter, Germain Osgooderri Gilland ([phone]) and message left.   Genelle BalVanessa Zelphia Glover, MSW, LCSW Licensed Clinical Social Worker Clinical Social Work Department [facility] [phone]

## 2015-04-09 NOTE — Progress Notes (Signed)
OT Cancellation Note  Patient Details Name: Carrie Glenn MRN: [id number] DOB: 10/31/1940   Cancelled Treatment:    Reason Eval/Treat Not Completed: Other (comment) Pt is Medicare/Medicaid and current D/C plan is SNF. No apparent immediate acute care OT needs, therefore will defer OT to SNF. If OT eval is needed please call Acute Rehab Dept. at [phone] or text page OT at [phone].  [facility],HILLARY  Carrie Glenn, OTR/L  [phone] 04/09/2015 04/09/2015, 5:15 PM

## 2015-04-09 NOTE — Progress Notes (Signed)
[facility] PROGRESS NOTE  Carrie Glenn ZOX:[id number] DOB: 1941-06-26 DOA: 04/07/2015 PCP: Leotis Shames, MD  Assessment/Plan: Frequent falls -Suspect related to pneumonia in an elderly, debilitated woman. -Seen by PT with recommendations for SNF, daughter agrees, discussed with social work.  Hospital-acquired pneumonia -Continue broad-spectrum antibiotics today, can probably start narrowing in the morning. -Follow results of blood culture.  Acute encephalopathy -Secondary to pneumonia. -Resolved, per daughter back to baseline. -MRI of brain without contrast is without acute abnormalities.  Hypertension -Fair control, anticipate no medication changes while in the hospital.  Diabetes mellitus -Well controlled, continue current regimen.  Hypokalemia -Replete orally, check magnesium level.  Code Status: Full code Family Communication: Daughter Joni Reining at bedside updated on plan of care  Disposition Plan: To SNF when bed available, likely in a.m.   Consultants:  None   Antibiotics:  Vancomycin and Primaxin     Subjective: Very hard of hearing, hearing aids are left at home, no complaints, specifically no shortness of breath or chest pain. Daughter states her mental status is back to baseline.   Objective: Filed Vitals:   04/09/15 0341 04/09/15 0831 04/09/15 0936 04/09/15 1131  BP: 156/67 139/62 155/65 155/65  Pulse: 84 77 79 79  Temp: 99.1 F (37.3 C) 98.4 F (36.9 C) 98.7 F (37.1 C)   TempSrc: Oral Oral Oral   Resp: Height:      Weight:      SpO2: 93% 95% 94%     Intake/Output Summary (Last 24 hours) at 04/09/15 1513 Last data filed at 04/09/15 8119  Gross per 24 hour  Intake 2266.25 ml  Output   1175 ml  Net 1091.25 ml   Filed Weights   04/07/15 1400 04/07/15 2114 04/08/15 2017  Weight: 72.2 kg (159 lb 2.8 oz) 72.3 kg (159 lb 6.3 oz) 73 kg (160 lb 15 oz)    Exam:   General:  Awake, alert, oriented 3    Cardiovascular: Regular rate and rhythm   Respiratory: Clear to auscultation bilaterally   Abdomen: Soft, nontender, nondistended, positive bowel sounds   Extremities: No clubbing, cyanosis or edema, positive pulses  Neurologic:  Nonfocal  Data Reviewed: Basic Metabolic Panel:  Recent Labs Lab 04/06/15 2102 04/08/15 0433  NA 139 137  K 3.1* 3.1*  CL 103 104  CO2 27 23  GLUCOSE 144* 129*  BUN 13 8  CREATININE 0.96 0.77  CALCIUM 8.6* 8.8*   Liver Function Tests:  Recent Labs Lab 04/06/15 2102  AST 20  ALT 8*  ALKPHOS 47  BILITOT 0.7  PROT 6.7  ALBUMIN 3.7   No results for input(s): LIPASE, AMYLASE in the last 168 hours. No results for input(s): AMMONIA in the last 168 hours. CBC:  Recent Labs Lab 04/06/15 2102 04/08/15 0433  WBC 7.5 8.9  HGB 11.7* 12.2  HCT 35.1 36.7  MCV 94.6 92.7  PLT 101* 101*   Cardiac Enzymes:  Recent Labs Lab 04/07/15 1421 04/07/15 1648 04/07/15 2228  TROPONINI <0.03 <0.03 <0.03   BNP (last 3 results) No results for input(s): BNP in the last 8760 hours.  ProBNP (last 3 results) No results for input(s): PROBNP in the last 8760 hours.  CBG:  Recent Labs Lab 04/08/15 1142 04/08/15 1658 04/08/15 2025 04/09/15 0820 04/09/15 1148  GLUCAP 112* 114* 104* 117* 146*    Recent Results (from the past 240 hour(s))  Blood culture (routine x 2)     Status: None (Preliminary result)  Collection Time: 04/06/15 10:47 PM  Result Value Ref Range Status   Specimen Description BLOOD PERIPHERAL  Final   Special Requests BOTTLES DRAWN AEROBIC AND ANAEROBIC 2CC  Final   Culture NO GROWTH 3 DAYS  Final   Report Status PENDING  Incomplete  Blood culture (routine x 2)     Status: None (Preliminary result)   Collection Time: 04/06/15 11:13 PM  Result Value Ref Range Status   Specimen Description BLOOD PERIPHERAL  Final   Special Requests BOTTLES DRAWN AEROBIC AND ANAEROBIC 2CC  Final   Culture NO GROWTH 3 DAYS  Final   Report  Status PENDING  Incomplete  MRSA PCR Screening     Status: None   Collection Time: 04/07/15  9:20 AM  Result Value Ref Range Status   MRSA by PCR NEGATIVE NEGATIVE Final    Comment:        The GeneXpert MRSA Assay (FDA approved for NASAL specimens only), is one component of a comprehensive MRSA colonization surveillance program. It is not intended to diagnose MRSA infection nor to guide or monitor treatment for MRSA infections.   Culture, blood (routine x 2)     Status: None (Preliminary result)   Collection Time: 04/08/15 10:41 AM  Result Value Ref Range Status   Specimen Description BLOOD RIGHT ARM  Final   Special Requests BOTTLES DRAWN AEROBIC ONLY  5CC  Final   Culture NO GROWTH 1 DAY  Final   Report Status PENDING  Incomplete  Culture, blood (routine x 2)     Status: None (Preliminary result)   Collection Time: 04/08/15 10:45 AM  Result Value Ref Range Status   Specimen Description BLOOD RIGHT HAND  Final   Special Requests BOTTLES DRAWN AEROBIC ONLY  3CC  Final   Culture NO GROWTH 1 DAY  Final   Report Status PENDING  Incomplete     Studies: Dg Chest Port 1 View  04/08/2015   CLINICAL DATA:  Fever, diabetes  EXAM: PORTABLE CHEST - 1 VIEW  COMPARISON:  PA and lateral chest x-ray of April 06, 2015  FINDINGS: The lungs are hypoinflated today. The lung markings are coarse at both bases. The left hemidiaphragm is obscured. The cardiac silhouette remains enlarged. The pulmonary vascularity exhibits crowding but no cephalization. The trachea is midline. The bony thorax is unremarkable.  IMPRESSION: Bilateral hypoinflation. Left lower lobe atelectasis/pneumonia may be developing.   Electronically Signed   By: David  Swazil[place]Jordan M.D.   On: 04/08/2015 09:16    Scheduled Meds: . aspirin EC  81 mg Oral Daily  . heparin  5,000 Units Subcutaneous 3 times per day  . imipenem-cilastatin  500 mg Intravenous 3 times per day  . insulin aspart  0-15 Units Subcutaneous TID WC  . insulin aspart   0-5 Units Subcutaneous QHS  . metoprolol  75 mg Oral BID  . mirtazapine  15 mg Oral QHS  . sodium chloride  3 mL Intravenous Q12H  . vancomycin  750 mg Intravenous Q12H   Continuous Infusions: . sodium chloride 75 mL/hr at 04/09/15 [id number]    Active Problems:   Weakness generalized   HTN (hypertension)   Diabetes    Time spent: 30 minutes.  Greater than 50% of this time was spent in direct contact with the patient coordinating care.    Chaya JanHERNANDEZ ACOSTA,ESTELA  [facility] Pager [phone]  If 7PM-7AM, please contact night-coverage at [email or web address], password [email or web address] 04/09/2015, 3:13 PM  LOS: 2 days

## 2015-04-09 NOTE — Clinical Social Work Note (Signed)
Clinical Social Work Assessment  Patient Details  Name: Carrie Glenn MRN: [id number] Date of Birth: 10/04/1940  Date of referral:  04/09/15               Reason for consult:  Facility Placement                Permission sought to share information with:  Family Supports, Other (Patient oriented to person only, daughter Germain Osgooderri Gilland [phone]) contacted.) Permission granted to share information::  No (Patient only oriented to self, daughter contacted)  Name::     Germain Osgooderri Gilland  Agency::     Relationship::  Daughter  Contact Information:  [phone]  Housing/Transportation Living arrangements for the past 2 months:  Single Family Home Source of Information:  Adult Children Patient Interpreter Needed:  None Criminal Activity/Legal Involvement Pertinent to Current Situation/Hospitalization:  No - Comment as needed Significant Relationships:  Adult Children, Other Family Members Lives with:  Self Do you feel safe going back to the place where you live?  Yes Need for family participation in patient care:  Yes (Comment)  Care giving concerns:  No concerns expressed by daughter regarding care at the hospital. Ms. Eduardo OsierGilland expressed that patient needs ST rehab at discharge.   Social Worker assessment / plan:  CSW talked with patient's daughter, Germain Osgooderri Gilland by phone regarding discharge planning and PT/MD's recommendation of ST rehab. Daughter explained that when patient discharged from the hospital in June '16, the family did rehab with patient at home, however daughter feels that ST rehab at a skilled facility will be more appropriate for patient at discharge. Ms. Dan HumphreysWalker lives alone and daughter stays with her on Friday, Saturday and Sunday nights. Ms. Eduardo OsierGilland mentioned that she does have a brother.  CSW explained facility search process and took list for SNF's in [place] to room.  Employment status:  Retired Health and safety inspectornsurance information:  Medicare (Patient also has [facility]  benefits) PT Recommendations:    Information / Referral to community resources:  Other (Comment Required) (None needed or requested at this time.)  Patient/Family's Response to care:  Daughter did not express any concerns regarding patient's care.  Patient/Family's Understanding of and Emotional Response to Diagnosis, Current Treatment, and Prognosis:  Not discussed.  Emotional Assessment Appearance:  Appears stated age Attitude/Demeanor/Rapport:  Unable to Assess Affect (typically observed):  Unable to Assess Orientation:  Oriented to Self Alcohol / Substance use:  Never Used Psych involvement (Current and /or in the community):  No (Comment)  Discharge Needs  Concerns to be addressed:  Discharge Planning Concerns Readmission within the last 30 days:  Yes Current discharge risk:  None Barriers to Discharge:  No Barriers Identified   Cristobal GoldmannCrawford, Kishia Shackett Bradley, LCSW 04/09/2015, 4:29 PM

## 2015-04-09 NOTE — Clinical Social Work Placement (Signed)
   CLINICAL SOCIAL WORK PLACEMENT  NOTE  Date:  04/09/2015  Patient Details  Name: Carrie Glenn MRN: [id number] Date of Birth: 09/29/1940  Clinical Social Work is seeking post-discharge placement for this patient at the Skilled  Nursing Facility level of care (*CSW will initial, date and re-position this form in  chart as items are completed):  Yes   Patient/family provided with [facility] Clinical Social Work Department's list of facilities offering this level of care within the geographic area requested by the patient (or if unable, by the patient's family).  Yes   Patient/family informed of their freedom to choose among providers that offer the needed level of care, that participate in Medicare, Medicaid or managed care program needed by the patient, have an available bed and are willing to accept the patient.  Yes   Patient/family informed of [facility]'s ownership interest in [facility] and [facility], as well as of the fact that they are under no obligation to receive care at these facilities.  PASRR submitted to EDS on 04/09/15     PASRR number received on 04/09/15     Existing PASRR number confirmed on       FL2 transmitted to all facilities in geographic area requested by pt/family on 04/09/15     FL2 transmitted to all facilities within larger geographic area on       Patient informed that his/her managed care company has contracts with or will negotiate with certain facilities, including the following:            Patient/family informed of bed offers received.  Patient chooses bed at       Physician recommends and patient chooses bed at      Patient to be transferred to   on  .  Patient to be transferred to facility by       Patient family notified on   of transfer.  Name of family member notified:        PHYSICIAN       Additional Comment:    _______________________________________________ Cristobal Goldmannrawford, Emelia Sandoval Bradley, LCSW 04/09/2015, 4:37  PM

## 2015-04-10 ENCOUNTER — Encounter
Admission: RE | Admit: 2015-04-10 | Discharge: 2015-04-10 | Disposition: A | Discharge status: Home / Self Care | Payer: Medicare Other | Source: Ambulatory Visit | Attending: Internal Medicine | Admitting: Internal Medicine

## 2015-04-10 DIAGNOSIS — J189 Pneumonia, unspecified organism: Principal | ICD-10-CM

## 2015-04-10 DIAGNOSIS — R296 Repeated falls: Secondary | ICD-10-CM

## 2015-04-10 DIAGNOSIS — G9340 Encephalopathy, unspecified: Secondary | ICD-10-CM

## 2015-04-10 LAB — CBC
HCT: 36.1 % (ref 36.0–46.0)
HEMOGLOBIN: 12.2 g/dL (ref 12.0–15.0)
MCH: 31 pg (ref 26.0–34.0)
MCHC: 33.8 g/dL (ref 30.0–36.0)
MCV: 91.6 fL (ref 78.0–100.0)
Platelets: 133 10*3/uL — ABNORMAL LOW (ref 150–400)
RBC: 3.94 MIL/uL (ref 3.87–5.11)
RDW: 12.3 % (ref 11.5–15.5)
WBC: 7.5 10*3/uL (ref 4.0–10.5)

## 2015-04-10 LAB — BASIC METABOLIC PANEL
Anion gap: 10 (ref 5–15)
BUN: 8 mg/dL (ref 6–20)
CO2: 19 mmol/L — ABNORMAL LOW (ref 22–32)
CREATININE: 0.76 mg/dL (ref 0.44–1.00)
Calcium: 8.7 mg/dL — ABNORMAL LOW (ref 8.9–10.3)
Chloride: 104 mmol/L (ref 101–111)
GFR calc non Af Amer: >60 mL/min (ref >60)
Glucose, Bld: 154 mg/dL — ABNORMAL HIGH (ref 65–99)
Potassium: 3.8 mmol/L (ref 3.5–5.1)
Sodium: 133 mmol/L — ABNORMAL LOW (ref 135–145)

## 2015-04-10 LAB — GLUCOSE, CAPILLARY
GLUCOSE-CAPILLARY: 159 mg/dL — AB (ref 65–99)
GLUCOSE-CAPILLARY: 147 mg/dL — AB (ref 65–99)

## 2015-04-10 MED ORDER — MAGNESIUM SULFATE 2 GM/50ML IV SOLN
2.0000 g | Freq: Once | INTRAVENOUS | Status: AC
  Administered 2015-04-10: 2 g via INTRAVENOUS
  Filled 2015-04-10: qty 50

## 2015-04-10 MED ORDER — LEVOFLOXACIN 500 MG PO TABS: 500.0000 mg | ORAL_TABLET | Freq: Every day | ORAL | Status: DC

## 2015-04-10 MED ORDER — HYDROCODONE-ACETAMINOPHEN 7.5-325 MG PO TABS: 1.0000 | ORAL_TABLET | Freq: Three times a day (TID) | ORAL | Status: DC | PRN

## 2015-04-10 MED ORDER — ALPRAZOLAM ER 0.5 MG PO TB24: 0.5000 mg | ORAL_TABLET | Freq: Every day | ORAL | Status: DC

## 2015-04-10 NOTE — Progress Notes (Signed)
04/10/2015 3:14 PM  Report called to Verlon AuLeslie at [facility]. Theadora RamaKIRKMAN, Wirt Hemmerich [place]

## 2015-04-10 NOTE — Discharge Summary (Signed)
Physician Discharge Summary  Carrie Glenn VZC:[id number] DOB: [birth year] DOA: 04/07/2015  PCP: Carrie Axe, MD  Admit date: 04/07/2015 Discharge date: 04/10/2015  Time spent: 45 minutes  Recommendations for Outpatient Follow-up:  -Will be discharged to skilled nursing facility today.  -Has 5 days of Levaquin remaining to complete treatment for pneumonia.  Discharge Diagnoses:  Principal Problem:   HCAP (healthcare-associated pneumonia) Active Problems:   Acute encephalopathy   Weakness generalized   HTN (hypertension)   Diabetes   Frequent falls   Hypokalemia   Discharge Condition: Stable and improved  Filed Weights   04/07/15 1400 04/07/15 2114 04/08/15 2017  Weight: 72.2 kg (159 lb 2.8 oz) 72.3 kg (159 lb 6.3 oz) 73 kg (160 lb 15 oz)    History of present illness:  Carrie Glenn is a 74 y.o. female  With PMHx of HTN, DM. She was recently hospitalized at [facility] 6/11-6/15 for UTI/weakness. SNF was recommended but family elected to take home instead. Patient says she has been at home alone. She has been unable to get up at night due to being too weak. She has a hospital bed but she has been unable to pull herself up. Per record, patient fell x 2 over the last 2 days. It also states she has been hallucinating and having thick speech as well as leaning to the right. Patient denies. Patient does say she has had b/l knee pain since fall.  Says she does get cold but no chills, no fever, no headache, no chest pain, no cough No dysuria  She gets meals on wheels during the week.   In the ER, x rays were done of b/l knee and hip without fractures. Labs showed only a low K. Patient was transferred to [facility] from [place] at family's request. No sign of infection in urine or on chest xray.     Hospital Course:   Frequent falls -Suspect related to pneumonia in an elderly, debilitated woman. -Seen by PT with recommendations for SNF, daughter agrees, will  proceed with discharge today.  Hospital-acquired pneumonia -Culture data remains negative to date. -Patient is afebrile and without leukocytosis. -Antibiotics will be narrowed to Levaquin to complete 7 more days of treatment.  Acute encephalopathy -Resolved, secondary to pneumonia. -MRI of brain without contrast is without acute abnormalities. -Per daughter Carrie Glenn she is back to her baseline.  Hypertension -Fair control.  Diabetes mellitus -Well-controlled.  Hypokalemia -Repleted  Hypomagnesemia -Repleted   Procedures:  None   Consultations:  None  Discharge Instructions  Discharge Instructions    Diet - low sodium heart healthy    Complete by:  As directed      Increase activity slowly    Complete by:  As directed             Medication List    STOP taking these medications        hydrochlorothiazide 12.5 MG capsule  Commonly known as:  MICROZIDE     isosorbide mononitrate 30 MG 24 hr tablet  Commonly known as:  IMDUR     olmesartan 20 MG tablet  Commonly known as:  BENICAR     rosuvastatin 10 MG tablet  Commonly known as:  CRESTOR      TAKE these medications        ACCU-CHEK COMPACT CARE KIT Kit  1 kit by Other route See admin instructions.     acetaminophen 500 MG tablet  Commonly known as:  TYLENOL  Take 1,000 mg  by mouth every 6 (six) hours as needed (pain).     ALPRAZolam 0.5 MG 24 hr tablet  Commonly known as:  XANAX XR  Take 1 tablet (0.5 mg total) by mouth daily.     aspirin EC 81 MG tablet  Take 81 mg by mouth daily after supper.     Calcium Carbonate-Vitamin D 600-400 MG-UNIT per tablet  Take 1 tablet by mouth daily.     docusate sodium 100 MG capsule  Commonly known as:  COLACE  Take 1 capsule (100 mg total) by mouth 2 (two) times daily.     fluticasone 50 MCG/ACT nasal spray  Commonly known as:  FLONASE  Place 2 sprays into the nose at bedtime as needed for allergies or rhinitis.     gabapentin 300 MG capsule    Commonly known as:  NEURONTIN  Take 300 mg by mouth daily after supper.     glucose blood test strip  1 each 2 (two) times daily.     HYDROcodone-acetaminophen 7.5-325 MG per tablet  Commonly known as:  NORCO  Take 1 tablet by mouth 3 (three) times daily as needed for moderate pain or severe pain.  Start taking on:  05/06/2015     levofloxacin 500 MG tablet  Commonly known as:  LEVAQUIN  Take 1 tablet (500 mg total) by mouth daily.     metoprolol 50 MG tablet  Commonly known as:  LOPRESSOR  Take 75 mg by mouth 2 (two) times daily.     mirtazapine 15 MG tablet  Commonly known as:  REMERON  Take 15 mg by mouth daily after supper.     multivitamin with minerals Tabs tablet  Take 1 tablet by mouth daily. Centrum Silver     omeprazole 20 MG tablet  Commonly known as:  PRILOSEC OTC  Take 20 mg by mouth 2 (two) times daily.     sitaGLIPtin 50 MG tablet  Commonly known as:  JANUVIA  Take 50 mg by mouth daily after supper.     solifenacin 5 MG tablet  Commonly known as:  VESICARE  Take 5 mg by mouth daily.     Vitamin D3 2000 UNITS capsule  Take 2,000 Units by mouth daily.       Allergies  Allergen Reactions  . Amlodipine Swelling  . Erythromycin Nausea Only  . Lexapro [Escitalopram Oxalate] Nausea Only  . Lipitor [Atorvastatin] Nausea And Vomiting  . Macrodantin [Nitrofurantoin Macrocrystal] Nausea Only  . Sertraline Hcl Other (See Comments)    Patients gets jittery.  . Trazodone And Nefazodone Other (See Comments)    Patient gets insomnia.  . Amoxicillin Diarrhea, Nausea And Vomiting, Nausea Only and Rash  . Azithromycin Rash  . Effexor [Venlafaxine] Rash  . Metformin And Related Itching and Rash  . Penicillins Rash      The results of significant diagnostics from this hospitalization (including imaging, microbiology, ancillary and laboratory) are listed below for reference.    Significant Diagnostic Studies: Dg Chest 2 View  04/07/2015   CLINICAL DATA:   Sepsis.  EXAM: CHEST  2 VIEW  COMPARISON:  03/08/2015  FINDINGS: Improved inspiration since previous study. Mild cardiac enlargement without vascular congestion. No focal airspace disease or consolidation. Esophageal hiatal hernia behind the heart. No blunting of costophrenic angles. No pneumothorax. Calcified and tortuous aorta. Postoperative changes in the cervical spine.  IMPRESSION: Cardiac enlargement without vascular congestion. No evidence of active pulmonary disease. Esophageal hiatal hernia.   Electronically Signed   By:  Lucienne Capers M.D.   On: 04/07/2015 00:11   Dg Knee 2 Views Left  04/06/2015   CLINICAL DATA:  Altered mental status with falls yesterday and today. Left knee pain.  EXAM: LEFT KNEE - 1-2 VIEW  COMPARISON:  None.  FINDINGS: Tricompartment degenerative changes in the left knee with compartment narrowing and osteophytosis throughout. Chondral calcinosis. No evidence of acute fracture or dislocation. No focal bone lesion. No significant effusion.  IMPRESSION: Tricompartment degenerative changes in the left knee. No acute bony abnormalities.   Electronically Signed   By: Lucienne Capers M.D.   On: 04/06/2015 22:04   Dg Knee 2 Views Right  04/06/2015   CLINICAL DATA:  Altered mental status with falls yesterday and today. Pain in the knee. Visible swelling.  EXAM: RIGHT KNEE - 1-2 VIEW  COMPARISON:  03/12/2015  FINDINGS: Diffuse bone demineralization. Diffuse tricompartment degenerative changes throughout the right knee with medial and patellofemoral joint space narrowing and osteophytosis. Chondral calcinosis. No evidence of acute fracture or dislocation. No focal bone lesion. No significant effusion. Soft tissues are unremarkable. Similar appearance to prior study.  IMPRESSION: Prominent tricompartment degenerative changes in the right knee. No acute bony abnormalities.   Electronically Signed   By: Lucienne Capers M.D.   On: 04/06/2015 22:03   Dg Knee 1-2 Views Right  03/12/2015    CLINICAL DATA:  Twisting injury of right knee yesterday. Right knee pain and swelling. Initial encounter.  EXAM: RIGHT KNEE - 1-2 VIEW  COMPARISON:  None  FINDINGS: There is no evidence of fracture, dislocation, or joint effusion.  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 Chondrocalcinosis also noted. No other significant osseous abnormality identified. Mild peripheral vascular calcification noted.  IMPRESSION: No acute findings.  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 Mild chondrocalcinosis also noted.   Electronically Signed   By: Earle Gell M.D.   On: 03/12/2015 12:33   Ct Head Wo Contrast  04/06/2015   CLINICAL DATA:  Altered mental status, neck pain after multiple falls.  EXAM: CT HEAD WITHOUT CONTRAST  CT CERVICAL SPINE WITHOUT CONTRAST  TECHNIQUE: Multidetector CT imaging of the head and cervical spine was performed following the standard protocol without intravenous contrast. Multiplanar CT image reconstructions of the cervical spine were also generated.  COMPARISON:  None.  FINDINGS: CT HEAD FINDINGS  Bony calvarium appears intact. Mild diffuse cortical atrophy is noted. Minimal chronic ischemic white matter disease is noted. No mass effect or midline shift is noted. Ventricular size is within normal limits. There is no evidence of mass lesion, hemorrhage or acute infarction.  CT CERVICAL SPINE FINDINGS  Status post surgical anterior fixation of C5-6. Severe degenerative disc disease is noted at C4-5 and C6-7. No fracture or spondylolisthesis is noted. Minimal degenerative changes seen involving posterior facet joints. Visualized lung apices appear normal.  IMPRESSION: Mild diffuse cortical atrophy. Minimal chronic ischemic white matter disease. No acute intracranial abnormality seen.  Postsurgical and degenerative changes are noted as described above. No acute abnormality seen in the  cervical spine.   Electronically Signed   By: Marijo Conception, M.D.   On: 04/06/2015 21:54   Ct Cervical Spine Wo Contrast  04/06/2015   CLINICAL DATA:  Altered mental status, neck pain after multiple falls.  EXAM: CT HEAD WITHOUT CONTRAST  CT CERVICAL SPINE WITHOUT CONTRAST  TECHNIQUE: Multidetector CT imaging of the head and cervical spine was performed following the standard protocol without intravenous contrast.  Multiplanar CT image reconstructions of the cervical spine were also generated.  COMPARISON:  None.  FINDINGS: CT HEAD FINDINGS  Bony calvarium appears intact. Mild diffuse cortical atrophy is noted. Minimal chronic ischemic white matter disease is noted. No mass effect or midline shift is noted. Ventricular size is within normal limits. There is no evidence of mass lesion, hemorrhage or acute infarction.  CT CERVICAL SPINE FINDINGS  Status post surgical anterior fixation of C5-6. Severe degenerative disc disease is noted at C4-5 and C6-7. No fracture or spondylolisthesis is noted. Minimal degenerative changes seen involving posterior facet joints. Visualized lung apices appear normal.  IMPRESSION: Mild diffuse cortical atrophy. Minimal chronic ischemic white matter disease. No acute intracranial abnormality seen.  Postsurgical and degenerative changes are noted as described above. No acute abnormality seen in the cervical spine.   Electronically Signed   By: Marijo Conception, M.D.   On: 04/06/2015 21:54   Mr Brain Wo Contrast  04/07/2015   CLINICAL DATA:  Fall with trauma to the head. Subsequent altered mental status with hallucinations.  EXAM: MRI HEAD WITHOUT CONTRAST  TECHNIQUE: Multiplanar, multiecho pulse sequences of the brain and surrounding structures were obtained without intravenous contrast.  COMPARISON:  Head CT 04/06/2015  FINDINGS: Diffusion imaging does not show any acute or subacute infarction. The brainstem is normal. 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 There are a few old small vessel infarctions affecting the thalami and basal ganglia. There are moderate chronic small-vessel ischemic changes affecting the deep and subcortical hemispheric white matter. No cortical or large vessel territory infarction. No mass lesion, acute hemorrhage, hydrocephalus or extra-axial collection. No pituitary mass. No inflammatory sinus disease. There are small mastoid effusions bilaterally. No skull or skullbase lesion.  IMPRESSION: No acute or reversible process. Old small vessel insults throughout the brain as outlined above.   Electronically Signed   By: Nelson Chimes M.D.   On: 04/07/2015 13:36   Dg Chest Port 1 View  04/08/2015   CLINICAL DATA:  Fever, diabetes  EXAM: PORTABLE CHEST - 1 VIEW  COMPARISON:  PA and lateral chest x-ray of April 06, 2015  FINDINGS: The lungs are hypoinflated today. The lung markings are coarse at both bases. The left hemidiaphragm is obscured. The cardiac silhouette remains enlarged. The pulmonary vascularity exhibits crowding but no cephalization. The trachea is midline. The bony thorax is unremarkable.  IMPRESSION: Bilateral hypoinflation. Left lower lobe atelectasis/pneumonia may be developing.   Electronically Signed   By: David  Martinique M.D.   On: 04/08/2015 09:16   Dg Hip Unilat With Pelvis 2-3 Views Right  04/06/2015   CLINICAL DATA:  Right hip pain after fall today.  Initial encounter.  EXAM: DG HIP (WITH OR WITHOUT PELVIS) 2-3V RIGHT  COMPARISON:  None.  FINDINGS: There is no evidence of hip fracture or dislocation. There is no evidence of arthropathy or other focal bone abnormality.  IMPRESSION: Normal right hip.   Electronically Signed   By: Marijo Conception, M.D.   On: 04/06/2015 22:03    Microbiology: Recent Results (from the past 240 hour(s))  Blood culture (routine x 2)     Status: None (Preliminary result)   Collection Time: 04/06/15 10:47 PM  Result Value Ref Range Status   Specimen Description BLOOD  PERIPHERAL  Final   Special Requests BOTTLES DRAWN AEROBIC AND ANAEROBIC 2CC  Final   Culture NO GROWTH 4 DAYS  Final   Report Status PENDING  Incomplete  Blood culture (routine x 2)     Status: None (Preliminary result)   Collection Time: 04/06/15 11:13 PM  Result Value Ref Range Status   Specimen Description BLOOD PERIPHERAL  Final   Special Requests BOTTLES DRAWN AEROBIC AND ANAEROBIC 2CC  Final   Culture NO GROWTH 4 DAYS  Final   Report Status PENDING  Incomplete  MRSA PCR Screening     Status: None   Collection Time: 04/07/15  9:20 AM  Result Value Ref Range Status   MRSA by PCR NEGATIVE NEGATIVE Final    Comment:        The GeneXpert MRSA Assay (FDA approved for NASAL specimens only), is one component of a comprehensive MRSA colonization surveillance program. It is not intended to diagnose MRSA infection nor to guide or monitor treatment for MRSA infections.   Culture, blood (routine x 2)     Status: None (Preliminary result)   Collection Time: 04/08/15 10:41 AM  Result Value Ref Range Status   Specimen Description BLOOD RIGHT ARM  Final   Special Requests BOTTLES DRAWN AEROBIC ONLY  5CC  Final   Culture NO GROWTH 1 DAY  Final   Report Status PENDING  Incomplete  Culture, blood (routine x 2)     Status: None (Preliminary result)   Collection Time: 04/08/15 10:45 AM  Result Value Ref Range Status   Specimen Description BLOOD RIGHT HAND  Final   Special Requests BOTTLES DRAWN AEROBIC ONLY  3CC  Final   Culture NO GROWTH 1 DAY  Final   Report Status PENDING  Incomplete     Labs: Basic Metabolic Panel:  Recent Labs Lab 04/06/15 2102 04/08/15 0433 04/09/15 1600 04/10/15 0507  NA 139 137  --  133*  K 3.1* 3.1*  --  3.8  CL 103 104  --  104  CO2 27 23  --  19*  GLUCOSE 144* 129*  --  154*  BUN 13 8  --  8  CREATININE 0.96 0.77  --  0.76  CALCIUM 8.6* 8.8*  --  8.7*  MG  --   --  1.6*  --    Liver Function Tests:  Recent Labs Lab 04/06/15 2102  AST 20    ALT 8*  ALKPHOS 47  BILITOT 0.7  PROT 6.7  ALBUMIN 3.7   No results for input(s): LIPASE, AMYLASE in the last 168 hours. No results for input(s): AMMONIA in the last 168 hours. CBC:  Recent Labs Lab 04/06/15 2102 04/08/15 0433 04/10/15 0507  WBC 7.5 8.9 7.5  HGB 11.7* 12.2 12.2  HCT 35.1 36.7 36.1  MCV 94.6 92.7 91.6  PLT 101* 101* 133*   Cardiac Enzymes:  Recent Labs Lab 04/07/15 1421 04/07/15 1648 04/07/15 2228  TROPONINI <0.03 <0.03 <0.03   BNP: BNP (last 3 results) No results for input(s): BNP in the last 8760 hours.  ProBNP (last 3 results) No results for input(s): PROBNP in the last 8760 hours.  CBG:  Recent Labs Lab 04/09/15 0820 04/09/15 1148 04/09/15 1618 04/09/15 2121 04/10/15 0732  GLUCAP 117* 146* 120* 132* 159*       Signed:  HERNANDEZ ACOSTA,ESTELA  [facility] Pager: [phone] 04/10/2015, 2:20 PM

## 2015-04-11 LAB — CULTURE, BLOOD (ROUTINE X 2)
CULTURE: NO GROWTH
CULTURE: NO GROWTH

## 2015-04-11 LAB — GLUCOSE, CAPILLARY: GLUCOSE-CAPILLARY: 131 mg/dL — AB (ref 65–99)

## 2015-04-13 LAB — CULTURE, BLOOD (ROUTINE X 2)
Culture: NO GROWTH
Culture: NO GROWTH

## 2015-04-15 LAB — URINALYSIS COMPLETE WITH MICROSCOPIC (ARMC ONLY)
BILIRUBIN URINE: NEGATIVE
Bacteria, UA: NONE SEEN
Glucose, UA: NEGATIVE mg/dL
HGB URINE DIPSTICK: NEGATIVE
KETONES UR: NEGATIVE mg/dL
Nitrite: NEGATIVE
Protein, ur: NEGATIVE mg/dL
RBC / HPF: NONE SEEN RBC/hpf (ref 0–5)
SPECIFIC GRAVITY, URINE: 1.015 (ref 1.005–1.030)
pH: 6 (ref 5.0–8.0)

## 2015-04-17 LAB — URINE CULTURE: Culture: NO GROWTH

## 2015-04-28 ENCOUNTER — Encounter
Admission: RE | Admit: 2015-04-28 | Discharge: 2015-04-28 | Disposition: A | Discharge status: Home / Self Care | Payer: Medicare Other | Source: Ambulatory Visit | Attending: Internal Medicine | Admitting: Internal Medicine

## 2015-06-10 DIAGNOSIS — F419 Anxiety disorder, unspecified: Secondary | ICD-10-CM | POA: Insufficient documentation

## 2015-06-10 DIAGNOSIS — E119 Type 2 diabetes mellitus without complications: Secondary | ICD-10-CM

## 2015-06-10 HISTORY — DX: Type 2 diabetes mellitus without complications: E11.9

## 2015-06-15 DIAGNOSIS — N3946 Mixed incontinence: Secondary | ICD-10-CM | POA: Insufficient documentation

## 2015-06-25 ENCOUNTER — Encounter: Payer: Self-pay | Admitting: *Deleted

## 2015-06-27 ENCOUNTER — Ambulatory Visit: Payer: Self-pay | Admitting: Obstetrics and Gynecology

## 2015-07-03 ENCOUNTER — Encounter: Payer: Self-pay | Admitting: *Deleted

## 2015-07-04 ENCOUNTER — Encounter: Payer: Self-pay | Admitting: Obstetrics and Gynecology

## 2015-07-04 ENCOUNTER — Ambulatory Visit (INDEPENDENT_AMBULATORY_CARE_PROVIDER_SITE_OTHER): Payer: Medicare Other | Admitting: Obstetrics and Gynecology

## 2015-07-04 VITALS — BP 170/89 | HR 60 | Temp 98.3°F | Resp 16 | Ht 63.0 in | Wt 136.4 lb

## 2015-07-04 DIAGNOSIS — N952 Postmenopausal atrophic vaginitis: Secondary | ICD-10-CM

## 2015-07-04 DIAGNOSIS — N390 Urinary tract infection, site not specified: Secondary | ICD-10-CM

## 2015-07-04 DIAGNOSIS — B373 Candidiasis of vulva and vagina: Secondary | ICD-10-CM | POA: Diagnosis not present

## 2015-07-04 DIAGNOSIS — B3731 Acute candidiasis of vulva and vagina: Secondary | ICD-10-CM

## 2015-07-04 LAB — URINALYSIS, COMPLETE
Bilirubin, UA: NEGATIVE
Glucose, UA: NEGATIVE
Ketones, UA: NEGATIVE
Nitrite, UA: NEGATIVE
PH UA: 6.5 (ref 5.0–7.5)
RBC UA: NEGATIVE
Specific Gravity, UA: >1.03 — ABNORMAL HIGH (ref 1.005–1.030)
Urobilinogen, Ur: 0.2 mg/dL (ref 0.2–1.0)

## 2015-07-04 LAB — MICROSCOPIC EXAMINATION
RBC, UA: NONE SEEN /hpf (ref 0–?)
Renal Epithel, UA: NONE SEEN /hpf

## 2015-07-04 LAB — BLADDER SCAN AMB NON-IMAGING

## 2015-07-04 MED ORDER — CEPHALEXIN 250 MG PO CAPS: 250.0000 mg | ORAL_CAPSULE | Freq: Every day | ORAL | Status: DC

## 2015-07-04 NOTE — Progress Notes (Signed)
07/04/2015 1:51 PM   Carrie Glenn 11/29/1940 [id number]  Referring provider: Glendon Axe, MD [facility] [place], [place] [address]  Chief Complaint  Patient presents with  . Recurrent UTI  . Establish Care    HPI:  Patient is a 74 year old female with a history of recurrent urinary tract infections with one recent admission for urosepsis in June presenting today with her daughter has a referral from her primary care provider.   She was most recently treated with a course of Levaquin and denies UTI symptoms today.     Her UTIs usually present with urinary frequency, nocturia and incomplete bladder emptying.   Her daughter states she was also recently treated for yeast vaginitis by her primary care provider.   Most recent urine culture was negative for infection. She does report vaginal symptoms of burning. No previous history of kidney stones. She has noted no gross hematuria.   Daily H2O intake 1 bottle.   PMH: Past Medical History  Diagnosis Date  . Hypertension   . Diabetes mellitus without complication ([place])   . Pain     Chronic back  . Blind     Legally  . Hard of hearing   . Retinitis pigmentosa of both eyes   . Arthritis   . Osteoporosis     Surgical History: Past Surgical History  Procedure Laterality Date  . Hiatal hernia repair    . Left foot surgery  Left     Hammer toe fixation-2nd toe  . 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  Always use your most recent med list.               ACCU-CHEK COMPACT CARE KIT Kit  1 kit by Other route See admin instructions.     acetaminophen 500 MG tablet  Commonly known as:  TYLENOL  Take 1,000 mg by mouth every 6 (six) hours as needed (pain).     ALPRAZolam 0.5 MG 24 hr tablet  Commonly known as:  XANAX XR  Take 1 tablet (0.5 mg total) by  mouth daily.     aspirin EC 81 MG tablet  Take 81 mg by mouth daily after supper.     Calcium Carbonate-Vitamin D 600-400 MG-UNIT tablet  Take 1 tablet by mouth daily.     docusate sodium 100 MG capsule  Commonly known as:  COLACE  Take 1 capsule (100 mg total) by mouth 2 (two) times daily.     fluticasone 50 MCG/ACT nasal spray  Commonly known as:  FLONASE  Place 2 sprays into the nose at bedtime as needed for allergies or rhinitis.     gabapentin 300 MG capsule  Commonly known as:  NEURONTIN  Take 300 mg by mouth daily after supper.     glucose blood test strip  1 each 2 (two) times daily.     HYDROcodone-acetaminophen 7.5-325 MG tablet  Commonly known as:  NORCO  Take 1 tablet by mouth 3 (three) times daily as needed for moderate pain or severe pain.     levofloxacin 500 MG tablet  Commonly known as:  LEVAQUIN  Take 1 tablet (500 mg total) by mouth daily.     metoprolol 50 MG tablet  Commonly known as:  LOPRESSOR  Take  75 mg by mouth 2 (two) times daily.     mirtazapine 15 MG tablet  Commonly known as:  REMERON  Take 15 mg by mouth daily after supper.     multivitamin with minerals Tabs tablet  Take 1 tablet by mouth daily. Centrum Silver     olmesartan 20 MG tablet  Commonly known as:  BENICAR  Take by mouth.     omeprazole 20 MG tablet  Commonly known as:  PRILOSEC OTC  Take 20 mg by mouth 2 (two) times daily.     sitaGLIPtin 50 MG tablet  Commonly known as:  JANUVIA  Take 50 mg by mouth daily after supper.     solifenacin 5 MG tablet  Commonly known as:  VESICARE  Take 5 mg by mouth daily.     Vitamin D3 2000 UNITS capsule  Take 2,000 Units by mouth daily.        Allergies:  Allergies  Allergen Reactions  . Amlodipine Swelling  . Erythromycin Nausea Only  . Lexapro [Escitalopram Oxalate] Nausea Only  . Lipitor [Atorvastatin] Nausea And Vomiting  . Macrodantin [Nitrofurantoin Macrocrystal] Nausea Only  . Sertraline Hcl Other (See Comments)     Patients gets jittery.  . Trazodone And Nefazodone Other (See Comments)    Patient gets insomnia.  . Amoxicillin Diarrhea, Nausea And Vomiting, Nausea Only and Rash  . Azithromycin Rash  . Effexor [Venlafaxine] Rash  . Metformin And Related Itching and Rash  . Penicillins Rash    Family History: No family history on file.  Social History:  reports that she has never smoked. She does not have any smokeless tobacco history on file. She reports that she does not drink alcohol. Her drug history is not on file.  ROS: UROLOGY Frequent Urination?: Yes Hard to postpone urination?: Yes Burning/pain with urination?: Yes Get up at night to urinate?: Yes Leakage of urine?: Yes Urine stream starts and stops?: No Trouble starting stream?: Yes Do you have to strain to urinate?: No Blood in urine?: No Urinary tract infection?: Yes Sexually transmitted disease?: No Injury to kidneys or bladder?: No Painful intercourse?: No Weak stream?: No Currently pregnant?: No Vaginal bleeding?: No Last menstrual period?: n/a  Gastrointestinal Nausea?: No Vomiting?: No Indigestion/heartburn?: No Diarrhea?: No Constipation?: Yes  Constitutional Fever: No Night sweats?: No Weight loss?: No Fatigue?: No  Skin Skin rash/lesions?: No Itching?: No  Eyes Blurred vision?: No Double vision?: No  Ears/Nose/Throat Sore throat?: No Sinus problems?: No  Hematologic/Lymphatic Swollen glands?: No Easy bruising?: No  Cardiovascular Leg swelling?: Yes Chest pain?: No  Respiratory Cough?: No Shortness of breath?: No  Endocrine Excessive thirst?: No  Musculoskeletal Back pain?: Yes Joint pain?: Yes  Neurological Headaches?: No Dizziness?: No  Psychologic Depression?: No Anxiety?: No  Physical Exam: BP 170/89 mmHg  Pulse 60  Temp(Src) 98.3 F (36.8 C)  Resp 16  Ht _0  (1.6 m)  Wt 136 lb 6.4 oz (61.871 kg)  BMI 24.17 kg/m2  Constitutional:  Alert and oriented, No  acute distress. HEENT: [place] AT, moist mucus membranes.  Trachea midline, no masses. Cardiovascular: No clubbing, cyanosis, or edema. Respiratory: Normal respiratory effort, no increased work of breathing. GI: Abdomen is soft, nontender, nondistended, no abdominal masses GU: No CVA tenderness.  Skin: No rashes, bruises or suspicious lesions. Lymph: No cervical or inguinal adenopathy. Neurologic: Grossly intact, no focal deficits, moving all 4 extremities. Psychiatric: Normal mood and affect.  Laboratory Data:   Urinalysis    Component Value Date/Time  COLORURINE YELLOW* 04/15/2015 1855   COLORURINE Straw 08/27/2013 1740   APPEARANCEUR HAZY* 04/15/2015 1855   APPEARANCEUR Clear 08/27/2013 1740   LABSPEC 1.015 04/15/2015 1855   LABSPEC 1.008 08/27/2013 1740   PHURINE 6.0 04/15/2015 1855   PHURINE 7.0 08/27/2013 1740   GLUCOSEU Negative 07/04/2015 1103   GLUCOSEU Negative 08/27/2013 1740   HGBUR NEGATIVE 04/15/2015 1855   HGBUR Negative 08/27/2013 1740   BILIRUBINUR Negative 07/04/2015 1103   BILIRUBINUR NEGATIVE 04/15/2015 1855   BILIRUBINUR Negative 08/27/2013 1740   KETONESUR NEGATIVE 04/15/2015 1855   KETONESUR Negative 08/27/2013 1740   PROTEINUR NEGATIVE 04/15/2015 1855   PROTEINUR Negative 08/27/2013 1740   NITRITE Negative 07/04/2015 1103   NITRITE NEGATIVE 04/15/2015 1855   NITRITE Negative 08/27/2013 1740   LEUKOCYTESUR Trace* 07/04/2015 1103   LEUKOCYTESUR TRACE* 04/15/2015 1855   LEUKOCYTESUR Trace 08/27/2013 1740    Pertinent Imaging:  Assessment & Plan:    1. Recurrent UTI-  Recurrent UTIs over the last year. Recent admission for urosepsis in June. She is continued to have recurrent UTIs since discharge. Most recently treated with Levaquin with follow-up culture negative. UTI prevention strategies discussed.  Good perineal hygiene reviewed. Patient is encouraged to increase daily water intake, start cranberry supplements to prevent invasive colonization along  the urinary tract and probiotics, especially lactobacillus to restore normal vaginal flora.  I will start suppressive therapy with a low-dose daily Keflex to continue for the next 3 months. - Urinalysis, Complete - BLADDER SCAN AMB NON-IMAGING  2. Atrophic Vaginitis-  Significant vaginal atrophy noted on exam as well as mucosal inflammation and irritation. Leukoplakia noted surrounding vaginal introitus. Possible lichen sclerosus vs skin changes from atrophy and yeast vaginitis vs vaginal cancer. I encouraged the patient be seen by GYN provider to obtain vaginal tissue biopsy. Patient's daughter declined referral at this time stating she would like to see if the estrogen cream improve symptoms prior to seeing another doctor. Follow-up with patient in 1 month and reexamine. Patient provided samples of vaginal estrogen cream and she will apply 2 external vagina and urethra 3 times weekly.  3. Yeast Vaginitis- Patient currently being treated by her PCP with Diflucan as well as topical antifungal.  Return in about 1 month (around 08/04/2015) for 1 month recheck vaginal atrophy; 3 month recheck recurrent UTI.  These notes generated with voice recognition software. I apologize for typographical errors.  Herbert Moors, [facility] [address] [place], [place] [address] [phone]

## 2015-07-06 LAB — CULTURE, URINE COMPREHENSIVE

## 2015-07-07 ENCOUNTER — Telehealth: Payer: Self-pay

## 2015-07-07 DIAGNOSIS — N390 Urinary tract infection, site not specified: Secondary | ICD-10-CM

## 2015-07-07 MED ORDER — SULFAMETHOXAZOLE-TRIMETHOPRIM 800-160 MG PO TABS: 1.0000 | ORAL_TABLET | Freq: Two times a day (BID) | ORAL | Status: AC

## 2015-07-07 NOTE — Telephone Encounter (Signed)
Terri made aware.

## 2015-07-07 NOTE — Telephone Encounter (Signed)
That should be okay. Thanks

## 2015-07-07 NOTE — Telephone Encounter (Signed)
Spoke with pt daughter, [name], in reference to pt having a +ucx. Reinforced with Terri to stop keflex while taking bactrim. Terri voiced understanding. Medication called into pharmacy. Terri stated pt has an appt in 4 weeks for cath specimen. Is that ok?

## 2015-07-07 NOTE — Telephone Encounter (Signed)
-----   Message from Fernanda Drum, FNP sent at 07/07/2015  9:06 AM EDT ----- Please notify patient's daughter and her urine culture was positive for bacterial growth. This could possibly be contamination but given patient's history of urosepsis I would like to go ahead and treat this with Bactrim 800/60 twice a day 1 week. Patient is currently on suppressive therapy with Keflex she needs to discontinue her Keflex while she is taking the Bactrim and restart once she is finished. I would like her to return in 2-3 weeks for nurse visit to have a catheter urine specimen obtained and sent for culture to confirm resolution of infection. Thanks

## 2015-08-08 ENCOUNTER — Ambulatory Visit (INDEPENDENT_AMBULATORY_CARE_PROVIDER_SITE_OTHER): Payer: Medicare Other | Admitting: Obstetrics and Gynecology

## 2015-08-08 ENCOUNTER — Encounter: Payer: Self-pay | Admitting: Obstetrics and Gynecology

## 2015-08-08 VITALS — BP 163/83 | HR 51 | Resp 16 | Ht 63.0 in | Wt 133.0 lb

## 2015-08-08 DIAGNOSIS — N952 Postmenopausal atrophic vaginitis: Secondary | ICD-10-CM | POA: Diagnosis not present

## 2015-08-08 DIAGNOSIS — B373 Candidiasis of vulva and vagina: Secondary | ICD-10-CM

## 2015-08-08 DIAGNOSIS — B3731 Acute candidiasis of vulva and vagina: Secondary | ICD-10-CM

## 2015-08-08 DIAGNOSIS — N390 Urinary tract infection, site not specified: Secondary | ICD-10-CM

## 2015-08-08 LAB — URINALYSIS, COMPLETE
BILIRUBIN UA: NEGATIVE
Glucose, UA: NEGATIVE
Ketones, UA: NEGATIVE
LEUKOCYTES UA: NEGATIVE
Nitrite, UA: NEGATIVE
RBC UA: NEGATIVE
SPEC GRAV UA: 1.015 (ref 1.005–1.030)
Urobilinogen, Ur: 0.2 mg/dL (ref 0.2–1.0)
pH, UA: 6 (ref 5.0–7.5)

## 2015-08-08 LAB — MICROSCOPIC EXAMINATION: RBC, UA: NONE SEEN /hpf (ref 0–?)

## 2015-08-08 MED ORDER — FLUCONAZOLE 150 MG PO TABS: ORAL_TABLET | ORAL | Status: DC

## 2015-08-08 MED ORDER — TERCONAZOLE 80 MG VA SUPP: 80.0000 mg | Freq: Every day | VAGINAL | Status: DC

## 2015-08-08 NOTE — Progress Notes (Signed)
08/08/2015 2:32 PM   Carrie Glenn 21-May-1941 [id number]  Referring provider: Glendon Axe, MD [place] [facility] [place], [place] [address]  Chief Complaint  Patient presents with  . Recurrent UTI    HPI: Patient is a 74yo female presenting today to provide a catheterized urine specimen for confirmation of resolution of UTI.  Last culture was positive for staph and patient was treated with bactrim.  She is currently on suppressive therapy using daily Keflex.  She continues to drink very little water.  She reports that her previous urinary symptoms have greatly improved. She reports complete resolution of urinary symptoms until she experienced some mild dysuria this morning.  Previous History: Patient is a 74 year old female with a history of recurrent urinary tract infections with one recent admission for urosepsis in June presenting today with her daughter has a referral from her primary care provider. She was most recently treated with a course of Levaquin and denies UTI symptoms today.   Her UTIs usually present with urinary frequency, nocturia and incomplete bladder emptying.  Her daughter states she was also recently treated for yeast vaginitis by her primary care provider.  Most recent urine culture was negative for infection. She does report vaginal symptoms of burning. No previous history of kidney stones. She has noted no gross hematuria.  Daily H2O intake 1 bottle.   PMH: Past Medical History  Diagnosis Date  . Hypertension   . Diabetes mellitus without complication ([place])   . Pain     Chronic back  . Blind     Legally  . Hard of hearing   . Retinitis pigmentosa of both eyes   . Arthritis   . Osteoporosis     Surgical History: Past Surgical History  Procedure Laterality Date  . Hiatal hernia repair    . Left foot surgery  Left     Hammer toe fixation-2nd toe  . Toe amputation Left     Left 4th toe, partial amputation due to  wound not healing    Home Medications:    Medication List       This list is accurate as of: 08/08/15  2:32 PM.  Always use your most recent med list.               ACCU-CHEK COMPACT CARE KIT Kit  1 kit by Other route See admin instructions.     acetaminophen 500 MG tablet  Commonly known as:  TYLENOL  Take 1,000 mg by mouth every 6 (six) hours as needed (pain).     ALPRAZolam 0.5 MG 24 hr tablet  Commonly known as:  XANAX XR  Take 1 tablet (0.5 mg total) by mouth daily.     aspirin EC 81 MG tablet  Take 81 mg by mouth daily after supper.     Calcium Carbonate-Vitamin D 600-400 MG-UNIT tablet  Take 1 tablet by mouth daily.     cephALEXin 250 MG capsule  Commonly known as:  KEFLEX  Take 1 capsule (250 mg total) by mouth daily.     docusate sodium 100 MG capsule  Commonly known as:  COLACE  Take 1 capsule (100 mg total) by mouth 2 (two) times daily.     fluconazole 150 MG tablet  Commonly known as:  DIFLUCAN  Take 1 tab now and one tab in 1 week     fluticasone 50 MCG/ACT nasal spray  Commonly known as:  FLONASE  Place 2 sprays into the nose at bedtime  as needed for allergies or rhinitis.     gabapentin 300 MG capsule  Commonly known as:  NEURONTIN  Take 300 mg by mouth daily after supper.     glucose blood test strip  1 each 2 (two) times daily.     HYDROcodone-acetaminophen 7.5-325 MG tablet  Commonly known as:  NORCO  Take 1 tablet by mouth 3 (three) times daily as needed for moderate pain or severe pain.     levofloxacin 500 MG tablet  Commonly known as:  LEVAQUIN  Take 1 tablet (500 mg total) by mouth daily.     metoprolol 50 MG tablet  Commonly known as:  LOPRESSOR  Take 75 mg by mouth 2 (two) times daily.     mirtazapine 15 MG tablet  Commonly known as:  REMERON  Take 15 mg by mouth daily after supper.     multivitamin with minerals Tabs tablet  Take 1 tablet by mouth daily. Centrum Silver     olmesartan 20 MG tablet  Commonly known as:   BENICAR  Take by mouth.     omeprazole 20 MG tablet  Commonly known as:  PRILOSEC OTC  Take 20 mg by mouth 2 (two) times daily.     sitaGLIPtin 50 MG tablet  Commonly known as:  JANUVIA  Take 50 mg by mouth daily after supper.     solifenacin 5 MG tablet  Commonly known as:  VESICARE  Take 5 mg by mouth daily.     terconazole 80 MG vaginal suppository  Commonly known as:  TERAZOL 3  Place 1 suppository (80 mg total) vaginally at bedtime.     Vitamin D3 2000 UNITS capsule  Take 2,000 Units by mouth daily.        Allergies:  Allergies  Allergen Reactions  . Amlodipine Swelling  . Erythromycin Nausea Only  . Lexapro [Escitalopram Oxalate] Nausea Only  . Lipitor [Atorvastatin] Nausea And Vomiting  . Macrodantin [Nitrofurantoin Macrocrystal] Nausea Only  . Sertraline Hcl Other (See Comments)    Patients gets jittery.  . Trazodone And Nefazodone Other (See Comments)    Patient gets insomnia.  . Amoxicillin Diarrhea, Nausea And Vomiting, Nausea Only and Rash  . Azithromycin Rash  . Effexor [Venlafaxine] Rash  . Metformin And Related Itching and Rash  . Penicillins Rash    Family History: No family history on file.  Social History:  reports that she has never smoked. She does not have any smokeless tobacco history on file. She reports that she does not drink alcohol. Her drug history is not on file.  ROS: UROLOGY Frequent Urination?: No Hard to postpone urination?: No Burning/pain with urination?: Yes Get up at night to urinate?: No Leakage of urine?: Yes Urine stream starts and stops?: No Trouble starting stream?: No Do you have to strain to urinate?: No Blood in urine?: No Urinary tract infection?: No Sexually transmitted disease?: No Injury to kidneys or bladder?: No Painful intercourse?: No Weak stream?: No Currently pregnant?: No Vaginal bleeding?: No Last menstrual period?: n  Gastrointestinal Nausea?: No Vomiting?: No Indigestion/heartburn?:  No Diarrhea?: No Constipation?: No  Constitutional Fever: No Night sweats?: No Weight loss?: No Fatigue?: No  Skin Skin rash/lesions?: No Itching?: No  Eyes Blurred vision?: No Double vision?: No  Ears/Nose/Throat Sore throat?: No Sinus problems?: No  Hematologic/Lymphatic Swollen glands?: No Easy bruising?: No  Cardiovascular Leg swelling?: No Chest pain?: No  Respiratory Cough?: No Shortness of breath?: No  Endocrine Excessive thirst?: No  Musculoskeletal Back  pain?: No Joint pain?: No  Neurological Headaches?: No Dizziness?: No  Psychologic Depression?: No Anxiety?: No  Physical Exam: BP 163/83 mmHg  Pulse 51  Resp 16  Ht _0  (1.6 m)  Wt 133 lb (60.328 kg)  BMI 23.57 kg/m2  Constitutional:  Alert and oriented, No acute distress. HEENT: [place] AT, moist mucus membranes.  Trachea midline, no masses. Cardiovascular: No clubbing, cyanosis, or edema. Respiratory: Normal respiratory effort, no increased work of breathing. GI: Abdomen is soft, nontender, nondistended, no abdominal masses GU: No CVA tenderness.  Pelvic Exam: rash and irritation of bilateral labia majora, mucosa pale and friable consistent with vaginal atrophy, speculum exam not performed due to narrow introitus Skin: No rashes, bruises or suspicious lesions. Lymph: No cervical or inguinal adenopathy. Neurologic: Grossly intact, no focal deficits, moving all 4 extremities. Psychiatric: Normal mood and affect.  Laboratory Data:   Urinalysis  Results for orders placed or performed in visit on 07/04/15  CULTURE, URINE COMPREHENSIVE  Result Value Ref Range   Urine Culture, Comprehensive Final report (A)    Result 1 Comment (A)   Microscopic Examination  Result Value Ref Range   WBC, UA 0-5 0 -  5 /hpf   RBC, UA None seen 0 -  2 /hpf   Epithelial Cells (non renal) 0-10 0 - 10 /hpf   Renal Epithel, UA None seen None seen /hpf   Mucus, UA Present (A) Not Estab.   Bacteria, UA  Moderate (A) None seen/Few  Urinalysis, Complete  Result Value Ref Range   Specific Gravity, UA >1.030 (H) 1.005 - 1.030   pH, UA 6.5 5.0 - 7.5   Color, UA Yellow Yellow   Appearance Ur Clear Clear   Leukocytes, UA Trace (A) Negative   Protein, UA 1+ (A) Negative/Trace   Glucose, UA Negative Negative   Ketones, UA Negative Negative   RBC, UA Negative Negative   Bilirubin, UA Negative Negative   Urobilinogen, Ur 0.2 0.2 - 1.0 mg/dL   Nitrite, UA Negative Negative   Microscopic Examination See below:   BLADDER SCAN AMB NON-IMAGING  Result Value Ref Range   Scan Result 91m    Pertinent Imaging:   Assessment & Plan:    1. Recurrent UTI- In and out catheterized urinary specimen obtained today without difficulty using sterile technique.Will send specimen for culture. Continue previously discussed UTI prevention recommendations. - Urinalysis, Complete  2. Yeast Vaginitis- Vaginal irritation and rash noted on exam today. Prescription sent to patient's pharmacy for Terconazole cream.  3. Vaginal Atrophy- Patient instructed to continue Premarin vaginal cream 3 times weekly.  Return in about 6 weeks (around 09/19/2015).  These notes generated with voice recognition software. I apologize for typographical errors.  LHerbert Moors [facility] [address] [place] [phone]

## 2015-08-15 ENCOUNTER — Telehealth: Payer: Self-pay | Admitting: Obstetrics and Gynecology

## 2015-08-15 NOTE — Telephone Encounter (Signed)
Pt's daughter, Camelia Engerri, called requesting results for her urinalysis that was performed last Friday, 08/08/15.  Pt daughter's phone # is [phone].

## 2015-08-18 NOTE — Telephone Encounter (Signed)
Spoke to Terri in reference pt u/a. Terri voiced understanding stating she will bring pt to f/u in dec.

## 2015-09-04 DIAGNOSIS — M51369 Other intervertebral disc degeneration, lumbar region without mention of lumbar back pain or lower extremity pain: Secondary | ICD-10-CM

## 2015-09-04 DIAGNOSIS — M5136 Other intervertebral disc degeneration, lumbar region: Secondary | ICD-10-CM

## 2015-09-04 HISTORY — DX: Other intervertebral disc degeneration, lumbar region without mention of lumbar back pain or lower extremity pain: M51.369

## 2015-09-04 HISTORY — DX: Other intervertebral disc degeneration, lumbar region: M51.36

## 2015-09-19 DIAGNOSIS — K219 Gastro-esophageal reflux disease without esophagitis: Secondary | ICD-10-CM

## 2015-09-19 DIAGNOSIS — M546 Pain in thoracic spine: Secondary | ICD-10-CM | POA: Insufficient documentation

## 2015-09-19 HISTORY — DX: Gastro-esophageal reflux disease without esophagitis: K21.9

## 2015-09-26 ENCOUNTER — Ambulatory Visit: Payer: Medicare Other | Admitting: Obstetrics and Gynecology

## 2015-10-03 ENCOUNTER — Encounter: Payer: Self-pay | Admitting: Obstetrics and Gynecology

## 2015-10-03 ENCOUNTER — Ambulatory Visit (INDEPENDENT_AMBULATORY_CARE_PROVIDER_SITE_OTHER): Payer: Medicare Other | Admitting: Obstetrics and Gynecology

## 2015-10-03 VITALS — BP 170/82 | HR 53 | Ht 63.0 in | Wt 128.1 lb

## 2015-10-03 DIAGNOSIS — B373 Candidiasis of vulva and vagina: Secondary | ICD-10-CM | POA: Diagnosis not present

## 2015-10-03 DIAGNOSIS — B3731 Acute candidiasis of vulva and vagina: Secondary | ICD-10-CM

## 2015-10-03 DIAGNOSIS — N390 Urinary tract infection, site not specified: Secondary | ICD-10-CM

## 2015-10-03 LAB — URINALYSIS, COMPLETE
Bilirubin, UA: NEGATIVE
Glucose, UA: NEGATIVE
KETONES UA: NEGATIVE
NITRITE UA: NEGATIVE
PH UA: 7 (ref 5.0–7.5)
Specific Gravity, UA: 1.015 (ref 1.005–1.030)
UUROB: 0.2 mg/dL (ref 0.2–1.0)

## 2015-10-03 LAB — MICROSCOPIC EXAMINATION
EPITHELIAL CELLS (NON RENAL): NONE SEEN /HPF (ref 0–10)
RBC MICROSCOPIC, UA: NONE SEEN /HPF (ref 0–?)

## 2015-10-03 NOTE — Patient Instructions (Addendum)
Premarin Cream-  Use a blueberry sized amount of cream and apply to vaginal opening using finger tip every night for 2 weeks and then resume M-W-F nights  Terconozole Cream- apply to vagina in the morning and at lunch for 1 week

## 2015-10-03 NOTE — Progress Notes (Signed)
1:23 PM   Carrie Glenn [birth year] [id number]  Referring provider: Glendon Axe, MD [facility] [place], [place] [address]  Chief Complaint  Patient presents with  . Follow-up    possible UTI     HPI: Patient is a 75 year old female with a history of recurrent urinary tract infections with one recent admission for urosepsis in June presenting today with her daughter has a referral from her primary care provider. She was most recently treated with a course of Levaquin and denies UTI symptoms today.   Her UTIs usually present with urinary frequency, nocturia and incomplete bladder emptying.  Her daughter states she was also recently treated for yeast vaginitis by her primary care provider.  Most recent urine culture was negative for infection. She does report vaginal symptoms of burning. No previous history of kidney stones. She has noted no gross hematuria.  Daily H2O intake 1 bottle.  She presents today provide a catheterized urine specimen for confirmation of resolution of UTI.  Last culture was positive for staph and patient was treated with bactrim.  She is currently on suppressive therapy using daily Cipro.  She continues to drink very little water.  She reports that her previous urinary symptoms have greatly improved. She reports complete resolution of urinary symptoms until she experienced some mild dysuria this morning.    PMH: Past Medical History  Diagnosis Date  . Hypertension   . Diabetes mellitus without complication ([place])   . Pain     Chronic back  . Blind     Legally  . Hard of hearing   . Retinitis pigmentosa of both eyes   . Arthritis   . Osteoporosis   . HTN (hypertension) 04/07/2015  . Hypotension 03/08/2015  . HCAP (healthcare-associated pneumonia) 04/09/2015  . Gastro-esophageal reflux disease without esophagitis 09/19/2015  . Controlled type 2 diabetes mellitus without complication ([place]) 9/37/9024  . Diabetes ([place])  04/07/2015  . Type 2 diabetes mellitus ([place]) 11/28/2014  . Acute encephalopathy 04/09/2015  . Degeneration of intervertebral disc of lumbar region 09/04/2015  . Degenerative arthritis of lumbar spine 08/15/2014  . Acute renal failure ([place]) 03/09/2015  . Acute UTI 03/08/2015  . Awareness of heartbeats 07/10/2014  . Chronic back pain 03/08/2015  . Difficult or painful urination 05/23/2014  . Excessive urination at night 01/31/2014  . Frequent falls 04/09/2015    Surgical History: Past Surgical History  Procedure Laterality Date  . Hiatal hernia repair    . Left foot surgery  Left     Hammer toe fixation-2nd toe  . Toe amputation Left     Left 4th toe, partial amputation due to wound not healing    Home Medications:    Medication List       This list is accurate as of: 10/03/15  1:23 PM.  Always use your most recent med list.               ACCU-CHEK COMPACT CARE KIT Kit  1 kit by Other route See admin instructions.     acetaminophen 500 MG tablet  Commonly known as:  TYLENOL  Take 1,000 mg by mouth every 6 (six) hours as needed (pain).     ALPRAZolam 0.5 MG 24 hr tablet  Commonly known as:  XANAX XR  Take 1 tablet (0.5 mg total) by mouth daily.     aspirin EC 81 MG tablet  Take 81 mg by mouth daily after supper.     Calcium Carbonate-Vitamin D  600-400 MG-UNIT tablet  Take 1 tablet by mouth daily.     cephALEXin 250 MG capsule  Commonly known as:  KEFLEX  Take 1 capsule (250 mg total) by mouth daily.     ciprofloxacin 250 MG tablet  Commonly known as:  CIPRO  Take by mouth.     cyclobenzaprine 5 MG tablet  Commonly known as:  FLEXERIL  Take by mouth.     docusate sodium 100 MG capsule  Commonly known as:  COLACE  Take 1 capsule (100 mg total) by mouth 2 (two) times daily.     fluconazole 150 MG tablet  Commonly known as:  DIFLUCAN  Take 1 tab now and one tab in 1 week     fluticasone 50 MCG/ACT nasal spray  Commonly known as:  FLONASE  Place 2 sprays into the  nose at bedtime as needed for allergies or rhinitis.     gabapentin 300 MG capsule  Commonly known as:  NEURONTIN  Take 300 mg by mouth daily after supper.     glucose blood test strip  1 each 2 (two) times daily.     HYDROcodone-acetaminophen 7.5-325 MG tablet  Commonly known as:  NORCO  Take 1 tablet by mouth 3 (three) times daily as needed for moderate pain or severe pain.     levofloxacin 500 MG tablet  Commonly known as:  LEVAQUIN  Take 1 tablet (500 mg total) by mouth daily.     metoprolol 50 MG tablet  Commonly known as:  LOPRESSOR  Take 75 mg by mouth 2 (two) times daily.     mirtazapine 15 MG tablet  Commonly known as:  REMERON  Take 15 mg by mouth daily after supper.     multivitamin with minerals Tabs tablet  Take 1 tablet by mouth daily. Centrum Silver     olmesartan 20 MG tablet  Commonly known as:  BENICAR  Take by mouth.     omeprazole 20 MG tablet  Commonly known as:  PRILOSEC OTC  Take 20 mg by mouth 2 (two) times daily.     PRILOSEC 20 MG capsule  Generic drug:  omeprazole  Take by mouth. Reported on 10/03/2015     sitaGLIPtin 50 MG tablet  Commonly known as:  JANUVIA  Take 50 mg by mouth daily after supper.     solifenacin 5 MG tablet  Commonly known as:  VESICARE  Take 5 mg by mouth daily. Reported on 10/03/2015     terconazole 80 MG vaginal suppository  Commonly known as:  TERAZOL 3  Place 1 suppository (80 mg total) vaginally at bedtime.     Vitamin D3 2000 units capsule  Take 2,000 Units by mouth daily.        Allergies:  Allergies  Allergen Reactions  . Amlodipine Swelling  . Erythromycin Nausea Only  . Lexapro [Escitalopram Oxalate] Nausea Only  . Lipitor [Atorvastatin] Nausea And Vomiting  . Macrodantin [Nitrofurantoin Macrocrystal] Nausea Only  . Nitrofurantoin Nausea Only  . Sertraline Hcl Other (See Comments)    Patients gets jittery.  . Trazodone And Nefazodone Other (See Comments)    Patient gets insomnia.  .  Amoxicillin Diarrhea, Nausea And Vomiting, Nausea Only and Rash  . Azithromycin Rash  . Effexor [Venlafaxine] Rash  . Metformin And Related Itching and Rash  . Penicillins Rash    Family History: No family history on file.  Social History:  reports that she has never smoked. She does not have any smokeless tobacco  history on file. She reports that she does not drink alcohol. Her drug history is not on file.  ROS: UROLOGY Frequent Urination?: Yes Hard to postpone urination?: Yes Burning/pain with urination?: Yes Get up at night to urinate?: Yes Leakage of urine?: No Urine stream starts and stops?: No Trouble starting stream?: No Do you have to strain to urinate?: No Blood in urine?: No Urinary tract infection?: No Sexually transmitted disease?: No Injury to kidneys or bladder?: No Painful intercourse?: No Weak stream?: No Currently pregnant?: No Vaginal bleeding?: No Last menstrual period?: n  Gastrointestinal Nausea?: No Vomiting?: No Indigestion/heartburn?: No Diarrhea?: No Constipation?: No  Constitutional Fever: No Night sweats?: No Weight loss?: No Fatigue?: No  Skin Skin rash/lesions?: No Itching?: No  Eyes Blurred vision?: No Double vision?: No  Ears/Nose/Throat Sore throat?: No Sinus problems?: No  Hematologic/Lymphatic Swollen glands?: No Easy bruising?: No  Cardiovascular Leg swelling?: No Chest pain?: No  Respiratory Cough?: No Shortness of breath?: No  Endocrine Excessive thirst?: No  Musculoskeletal Back pain?: No Joint pain?: No  Neurological Headaches?: No Dizziness?: No  Psychologic Depression?: No Anxiety?: No  Physical Exam: BP 170/82 mmHg  Pulse 53  Ht _0  (1.6 m)  Wt 128 lb 1.6 oz (58.106 kg)  BMI 22.70 kg/m2  Constitutional:  Alert and oriented, No acute distress. HEENT: [place] AT, moist mucus membranes.  Trachea midline, no masses. Cardiovascular: No clubbing, cyanosis, or edema. Respiratory: Normal  respiratory effort, no increased work of breathing. GI: Abdomen is soft, nontender, nondistended, no abdominal masses GU: No CVA tenderness.  Pelvic Exam: rash and irritation of bilateral labia majora, mucosa pale and friable consistent with vaginal atrophy, improved from last visit Skin: No rashes, bruises or suspicious lesions. Lymph: No cervical or inguinal adenopathy. Neurologic: Grossly intact, no focal deficits, moving all 4 extremities. Psychiatric: Normal mood and affect.  Laboratory Data:   Urinalysis   Pertinent Imaging:   Assessment & Plan:    1. Recurrent UTI- In and out catheterized urinary specimen obtained today without difficulty using sterile technique.Will send specimen for culture. Continue previously discussed UTI prevention recommendations. Patient has been on suppressive therapy using once daily Cipro. We will discontinue medication at this time pending today's urine culture results. We will recheck in 1 month. - Urinalysis, Complete  2. Yeast Vaginitis- Vaginal irritation and rash noted on exam today.  Advised to continue using Terconazole cream. Twice daily for a week.  3. Vaginal Atrophy- Patient instructed to continue Premarin vaginal cream night for 2 weeks and then 3 times weekly.  Return in about 1 month (around 11/03/2015) for recheck vaginal atrophy.  These notes generated with voice recognition software. I apologize for typographical errors.  Herbert Moors, [facility] [address] [place], [place] [address] [phone]

## 2015-10-03 NOTE — Progress Notes (Signed)
In and Out Catheterization  Patient is present today for a I & O catheterization due to recurrent UTI. Patient was cleaned and prepped in a sterile fashion with hibiclens and Lidocaine 2% jelly was instilled into the urethra.  A 14 FR cath was inserted no complications were noted , 50 ml of urine return was noted, urine was light yellow in color. A clean urine sample was collected for culture. Bladder was drained  And catheter was removed with out difficulty.    Preformed by: Theotis BurrowK.Lacretia Tindall,CMA

## 2015-10-07 ENCOUNTER — Telehealth: Payer: Self-pay

## 2015-10-07 DIAGNOSIS — N390 Urinary tract infection, site not specified: Secondary | ICD-10-CM

## 2015-10-07 LAB — CULTURE, URINE COMPREHENSIVE

## 2015-10-07 MED ORDER — LEVOFLOXACIN 250 MG PO TABS: 250.0000 mg | ORAL_TABLET | Freq: Every day | ORAL | Status: AC

## 2015-10-07 NOTE — Telephone Encounter (Signed)
-----   Message from Fernanda DrumLindsay C Overton, FNP sent at 10/07/2015 10:17 AM EST ----- Please notify patient and her family that her urine culture was positive for infection. It is only susceptible to Levaquin. Please send a prescription for likely Levaquin 250 mg once a day 10 days. I would like her to return in 2 weeks for a repeat catheterized urine specimen and recheck. Please schedule his appointment.  Thanks

## 2015-10-07 NOTE — Telephone Encounter (Signed)
Spoke with pt in reference +ucx. Pt voiced understanding. Pt stated she would have her daughter call back to make f/u appt. Medication sent to pt pharmacy.

## 2015-10-21 ENCOUNTER — Ambulatory Visit: Payer: Medicare Other

## 2015-10-22 ENCOUNTER — Ambulatory Visit (INDEPENDENT_AMBULATORY_CARE_PROVIDER_SITE_OTHER): Payer: Medicare Other

## 2015-10-22 DIAGNOSIS — N952 Postmenopausal atrophic vaginitis: Secondary | ICD-10-CM | POA: Diagnosis not present

## 2015-10-22 DIAGNOSIS — N390 Urinary tract infection, site not specified: Secondary | ICD-10-CM

## 2015-10-22 MED ORDER — ESTROGENS, CONJUGATED 0.625 MG/GM VA CREA: 1.0000 | TOPICAL_CREAM | Freq: Every day | VAGINAL | Status: DC

## 2015-10-22 NOTE — Progress Notes (Signed)
In and Out Catheterization  Patient is present today for a I & O catheterization due to recurrent UTI. Patient was cleaned and prepped in a sterile fashion with betadine and Lidocaine 2% jelly was instilled into the urethra.  A 14FR cath was inserted no complications were noted , 50ml of urine return was noted, urine was yellow in color. A clean urine sample was collected for ucx. Bladder was drained  And catheter was removed with out difficulty.    Preformed by: Rupert Stacks, LPN  Follow up/ Additional notes: pt stated she was out of premarin cream. Refill was given based on Lindsay's last note.

## 2015-10-24 ENCOUNTER — Telehealth: Payer: Self-pay

## 2015-10-24 LAB — CULTURE, URINE COMPREHENSIVE

## 2015-10-24 NOTE — Telephone Encounter (Signed)
-----   Message from Fernanda Drum, FNP sent at 10/24/2015 11:01 AM EST ----- Please notify patient that her follow-up urine culture was negative for infection. Thanks

## 2015-10-24 NOTE — Telephone Encounter (Signed)
Spoke with pt in reference to ucx. Made aware it was negative. Pt voiced understanding. Pt had questions about estrace cream but became confused very quickly. Pt will have caregiver call back when available.

## 2015-10-24 NOTE — Telephone Encounter (Signed)
Pt caregiver called back and nurse reinforced pt should only apply a pea sized amount 3x a week. Caregiver stated that, that is how pt has been applying premarin cream. Advised caregiver to throw applicator away so pt will not get confused. Caregiver voiced understanding.

## 2015-11-04 ENCOUNTER — Ambulatory Visit: Payer: Medicare Other | Admitting: Obstetrics and Gynecology

## 2015-12-10 ENCOUNTER — Ambulatory Visit: Payer: Medicare Other | Admitting: Obstetrics and Gynecology

## 2016-01-01 ENCOUNTER — Other Ambulatory Visit: Payer: Self-pay | Admitting: Internal Medicine

## 2016-01-01 DIAGNOSIS — R634 Abnormal weight loss: Secondary | ICD-10-CM

## 2016-01-05 ENCOUNTER — Ambulatory Visit (INDEPENDENT_AMBULATORY_CARE_PROVIDER_SITE_OTHER): Payer: Medicare Other | Admitting: Urology

## 2016-01-05 ENCOUNTER — Encounter: Payer: Self-pay | Admitting: Urology

## 2016-01-05 VITALS — BP 161/80 | HR 51 | Ht 63.0 in | Wt 120.1 lb

## 2016-01-05 DIAGNOSIS — R300 Dysuria: Secondary | ICD-10-CM

## 2016-01-05 DIAGNOSIS — R350 Frequency of micturition: Secondary | ICD-10-CM

## 2016-01-05 LAB — URINALYSIS, COMPLETE
Bilirubin, UA: NEGATIVE
Glucose, UA: NEGATIVE
Ketones, UA: NEGATIVE
NITRITE UA: NEGATIVE
PROTEIN UA: NEGATIVE
RBC, UA: NEGATIVE
Specific Gravity, UA: 1.015 (ref 1.005–1.030)
Urobilinogen, Ur: 0.2 mg/dL (ref 0.2–1.0)
pH, UA: 7 (ref 5.0–7.5)

## 2016-01-05 LAB — MICROSCOPIC EXAMINATION: BACTERIA UA: NONE SEEN

## 2016-01-05 LAB — BLADDER SCAN AMB NON-IMAGING: Scan Result: 0

## 2016-01-05 NOTE — Progress Notes (Signed)
In and Out Catheterization  Patient is present today for a I & O catheterization due to dysuria and recurrent uti. Patient was cleaned and prepped in a sterile fashion with betadine and Lidocaine 2% jelly was instilled into the urethra.  A 14FR cath was inserted no complications were noted , 50ml of urine return was noted, urine was clear and yellow  in color. A clean urine sample was collected for clean catch urinalysis. Bladder was drained and catheter was removed with out difficulty.    Preformed by: Dallas Schimkeamona Williams CMA  Follow up/ Additional notes: After CT scan

## 2016-01-05 NOTE — Progress Notes (Signed)
01/05/2016 10:31 AM   Carrie Glenn [birth year] [id number]  Referring provider: Glendon Axe, MD [address] [facility] [place], [place] [address]   HPI:  1 - Recurrent Cystitis - pt with few episodes simple cystitis per year. No recent pyelo hospitaliztaion. NO recent retroperitoneal imaging for review. UCX's all colifroms (e. Coli, enterococcus with variable sensitivity). She is diabetic. Pelvic 12/2015 with atrophic changes.  PVR 12/2015 "23m". She has CT abd pending 12/2015 for eval unintentional weight loss.   2 - Atrophic Vaginitis - on premarin topical few times weekly for introital acidification / reduce UTI risk.   Today "Carrie Glenn" is seen in f/u above. No interval febrile UTI.  She is having progressive weight loss and failure to thrive. Her daughter is understandably frustrated.    PMH: Past Medical History  Diagnosis Date  . Hypertension   . Diabetes mellitus without complication ([place]   . Pain     Chronic back  . Blind     Legally  . Hard of hearing   . Retinitis pigmentosa of both eyes   . Arthritis   . Osteoporosis   . HTN (hypertension) 04/07/2015  . Hypotension 03/08/2015  . HCAP (healthcare-associated pneumonia) 04/09/2015  . Gastro-esophageal reflux disease without esophagitis 09/19/2015  . Controlled type 2 diabetes mellitus without complication ([place] 93/64/6803 . Diabetes ([place] 04/07/2015  . Type 2 diabetes mellitus ([place] 11/28/2014  . Acute encephalopathy 04/09/2015  . Degeneration of intervertebral disc of lumbar region 09/04/2015  . Degenerative arthritis of lumbar spine 08/15/2014  . Acute renal failure ([place] 03/09/2015  . Acute UTI 03/08/2015  . Awareness of heartbeats 07/10/2014  . Chronic back pain 03/08/2015  . Difficult or painful urination 05/23/2014  . Excessive urination at night 01/31/2014  . Frequent falls 04/09/2015    Surgical History: Past Surgical History  Procedure Laterality Date  . Hiatal hernia repair    . Left foot surgery   Left     Hammer toe fixation-2nd toe  . 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  Always use your most recent med list.               ACCU-CHEK COMPACT CARE KIT Kit  1 kit by Other route See admin instructions.     acetaminophen 500 MG tablet  Commonly known as:  TYLENOL  Take 1,000 mg by mouth every 6 (six) hours as needed (pain).     ALPRAZolam 0.5 MG 24 hr tablet  Commonly known as:  XANAX XR  Take 1 tablet (0.5 mg total) by mouth daily.     aspirin EC 81 MG tablet  Take 81 mg by mouth daily after supper.     Calcium Carbonate-Vitamin D 600-400 MG-UNIT tablet  Take 1 tablet by mouth daily.     cephALEXin 250 MG capsule  Commonly known as:  KEFLEX  Take 1 capsule (250 mg total) by mouth daily.     ciprofloxacin 250 MG tablet  Commonly known as:  CIPRO  Take by mouth.     conjugated estrogens vaginal cream  Commonly known as:  PREMARIN  Place 1 Applicatorful vaginally daily.     cyclobenzaprine 5 MG tablet  Commonly known as:  FLEXERIL  Take by mouth.     docusate sodium 100  MG capsule  Commonly known as:  COLACE  Take 1 capsule (100 mg total) by mouth 2 (two) times daily.     fluconazole 150 MG tablet  Commonly known as:  DIFLUCAN  Take 1 tab now and one tab in 1 week     fluticasone 50 MCG/ACT nasal spray  Commonly known as:  FLONASE  Place 2 sprays into the nose at bedtime as needed for allergies or rhinitis.     gabapentin 300 MG capsule  Commonly known as:  NEURONTIN  Take 300 mg by mouth daily after supper.     glucose blood test strip  1 each 2 (two) times daily.     HYDROcodone-acetaminophen 7.5-325 MG tablet  Commonly known as:  NORCO  Take 1 tablet by mouth 3 (three) times daily as needed for moderate pain or severe pain.     levofloxacin 500 MG tablet  Commonly known as:  LEVAQUIN  Take 1 tablet  (500 mg total) by mouth daily.     metoprolol 50 MG tablet  Commonly known as:  LOPRESSOR  Take 75 mg by mouth 2 (two) times daily.     mirtazapine 15 MG tablet  Commonly known as:  REMERON  Take 15 mg by mouth daily after supper.     multivitamin with minerals Tabs tablet  Take 1 tablet by mouth daily. Centrum Silver     olmesartan 20 MG tablet  Commonly known as:  BENICAR  Take by mouth.     omeprazole 20 MG tablet  Commonly known as:  PRILOSEC OTC  Take 20 mg by mouth 2 (two) times daily.     PRILOSEC 20 MG capsule  Generic drug:  omeprazole  Take by mouth. Reported on 10/03/2015     sitaGLIPtin 50 MG tablet  Commonly known as:  JANUVIA  Take 50 mg by mouth daily after supper.     solifenacin 5 MG tablet  Commonly known as:  VESICARE  Take 5 mg by mouth daily. Reported on 10/03/2015     terconazole 80 MG vaginal suppository  Commonly known as:  TERAZOL 3  Place 1 suppository (80 mg total) vaginally at bedtime.     Vitamin D3 2000 units capsule  Take 2,000 Units by mouth daily.        Allergies:  Allergies  Allergen Reactions  . Amlodipine Swelling  . Erythromycin Nausea Only  . Lexapro [Escitalopram Oxalate] Nausea Only  . Lipitor [Atorvastatin] Nausea And Vomiting  . Macrodantin [Nitrofurantoin Macrocrystal] Nausea Only  . Nitrofurantoin Nausea Only  . Sertraline Hcl Other (See Comments)    Patients gets jittery.  . Trazodone And Nefazodone Other (See Comments)    Patient gets insomnia.  . Amoxicillin Diarrhea, Nausea And Vomiting, Nausea Only and Rash  . Azithromycin Rash  . Effexor [Venlafaxine] Rash  . Metformin And Related Itching and Rash  . Penicillins Rash    Family History: No family history on file.  Social History:  reports that she has never smoked. She does not have any smokeless tobacco history on file. She reports that she does not drink alcohol. Her drug history is not on file.  ROS:   Review of Systems  Gastrointestinal (upper)   : Negative for upper GI symptoms  Gastrointestinal (lower) : Negative for lower GI symptoms  Constitutional : Negative for symptoms  Skin: Negative for skin symptoms  Eyes: Negative for eye symptoms  Ear/Nose/Throat : Negative for Ear/Nose/Throat symptoms  Hematologic/Lymphatic: Negative for Hematologic/Lymphatic symptoms  Cardiovascular :  Negative for cardiovascular symptoms  Respiratory : Negative for respiratory symptoms  Endocrine: Negative for endocrine symptoms  Musculoskeletal: Negative for musculoskeletal symptoms  Neurological: Negative for neurological symptoms  Psychologic: Negative for psychiatric symptoms       Physical Exam: There were no vitals taken for this visit.  Constitutional:  Frail, elderly. Stigmata of weight loss and dementia.  HEENT: [place] AT, moist mucus membranes.  Trachea midline, no masses. Cardiovascular: No clubbing, cyanosis, or edema. Respiratory: Normal respiratory effort, no increased work of breathing. GI: Abdomen is soft, nontender, nondistended, no abdominal masses GU: No CVA tenderness. Vaginal / introital atrophy. No pelvic organ prrolapse.  Skin: No rashes, bruises or suspicious lesions. Lymph: No cervical or inguinal adenopathy. Neurologic: Grossly intact, no focal deficits, moving all 4 extremities.   Laboratory Data: Lab Results  Component Value Date   WBC 7.5 04/10/2015   HGB 12.2 04/10/2015   HCT 36.1 04/10/2015   MCV 91.6 04/10/2015   PLT 133* 04/10/2015    Lab Results  Component Value Date   CREATININE 0.76 04/10/2015    No results found for: PSA  No results found for: TESTOSTERONE  No results found for: HGBA1C  Urinalysis    Component Value Date/Time   COLORURINE YELLOW* 04/15/2015 1855   COLORURINE Straw 08/27/2013 1740   APPEARANCEUR Clear 10/03/2015 1049   APPEARANCEUR HAZY* 04/15/2015 1855   APPEARANCEUR Clear 08/27/2013 1740   LABSPEC 1.015 04/15/2015 1855   LABSPEC 1.008  08/27/2013 1740   PHURINE 6.0 04/15/2015 1855   PHURINE 7.0 08/27/2013 1740   GLUCOSEU Negative 10/03/2015 1049   GLUCOSEU Negative 08/27/2013 1740   HGBUR NEGATIVE 04/15/2015 1855   HGBUR Negative 08/27/2013 1740   BILIRUBINUR Negative 10/03/2015 1049   BILIRUBINUR NEGATIVE 04/15/2015 1855   BILIRUBINUR Negative 08/27/2013 1740   KETONESUR NEGATIVE 04/15/2015 1855   KETONESUR Negative 08/27/2013 1740   PROTEINUR Trace* 10/03/2015 1049   PROTEINUR NEGATIVE 04/15/2015 1855   PROTEINUR Negative 08/27/2013 1740   NITRITE Negative 10/03/2015 1049   NITRITE NEGATIVE 04/15/2015 1855   NITRITE Negative 08/27/2013 1740   LEUKOCYTESUR Trace* 10/03/2015 1049   LEUKOCYTESUR TRACE* 04/15/2015 1855   LEUKOCYTESUR Trace 08/27/2013 1740    Pertinent Imaging:  PVR "63m"  Assessment & Plan:    1 - Recurrent Cystitis - she empties well. She is on premarin to reduce introital colonization. Theis and diabetes only modifiable factors on eval thus far. STRONGLY recommend against chronic ABX or ABX for treatment of asymptomatic bacteruria. Agree with pending CT as this will also allow to r/o stones or hydro (surgically correctable factors).   2 - Atrophic Vaginitis - continue premairin 2-3 X weekly.   3 - RTC 1 4-6 weeks (this will be after CT) to r/o hydro / stones, if stable, then anually v. Prn.      No Follow-up on file.  MAlexis Frock [facility] [address] [place] [phone]

## 2016-01-07 ENCOUNTER — Other Ambulatory Visit: Payer: Self-pay | Admitting: Internal Medicine

## 2016-01-07 DIAGNOSIS — R634 Abnormal weight loss: Secondary | ICD-10-CM

## 2016-01-08 ENCOUNTER — Ambulatory Visit
Admission: RE | Admit: 2016-01-08 | Discharge: 2016-01-08 | Disposition: A | Discharge status: Home / Self Care | Payer: Medicare Other | Source: Ambulatory Visit | Attending: Internal Medicine | Admitting: Internal Medicine

## 2016-01-08 DIAGNOSIS — K449 Diaphragmatic hernia without obstruction or gangrene: Secondary | ICD-10-CM | POA: Diagnosis not present

## 2016-01-08 DIAGNOSIS — K8689 Other specified diseases of pancreas: Secondary | ICD-10-CM | POA: Insufficient documentation

## 2016-01-08 DIAGNOSIS — R634 Abnormal weight loss: Secondary | ICD-10-CM | POA: Diagnosis present

## 2016-01-08 MED ORDER — IOPAMIDOL (ISOVUE-300) INJECTION 61%
85.0000 mL | Freq: Once | INTRAVENOUS | Status: AC | PRN
  Administered 2016-01-08: 85 mL via INTRAVENOUS

## 2016-02-03 ENCOUNTER — Ambulatory Visit: Payer: Medicare Other | Admitting: Urology

## 2016-10-05 IMAGING — MR MR HEAD W/O CM
10 of 12 series · 32 of 48 positions shown · non-contrast
Comparison: Head CT 04/06/2015

CLINICAL DATA: Fall with trauma to the head. Subsequent altered
mental status with hallucinations.

EXAM:
MRI HEAD WITHOUT CONTRAST
TECHNIQUE: Multiplanar, multiecho pulse sequences of the brain and surrounding
structures were obtained without intravenous contrast.

[Series 2: FLAIR · sagittal · 5.0mm · 0.47mm/px · 3 of 23 slices shown]
[im 1/23]
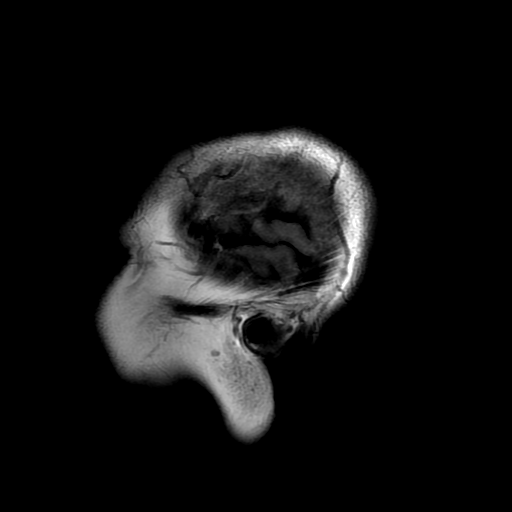
[im 12/23]
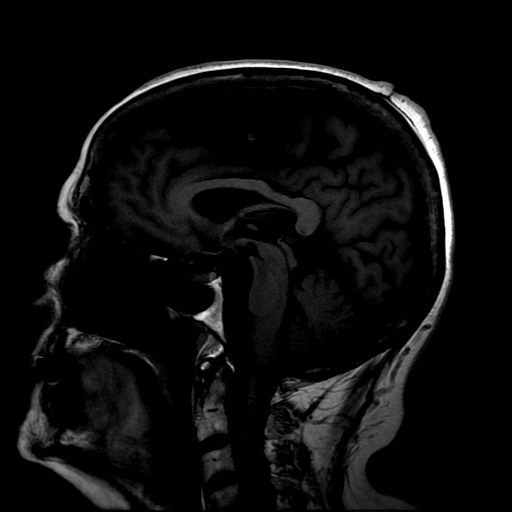
[im 23/23]
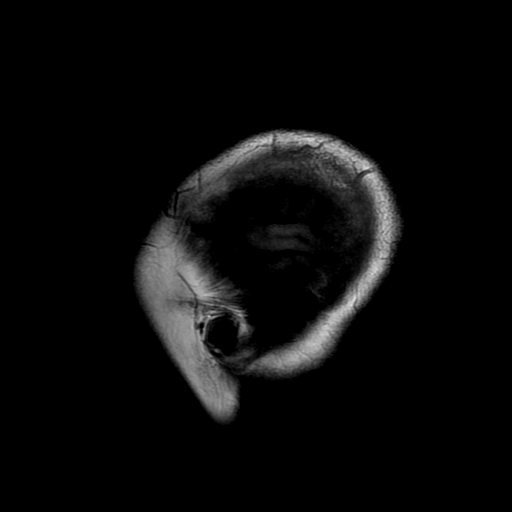

[Series 4: DWI · axial · 3.0mm · 0.94mm/px · z∈[-51,+96]mm · 7 of 99 slices shown (1 of 4)]
[im 1/99]
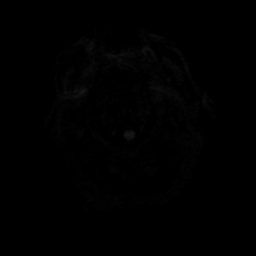
[im 17/99]
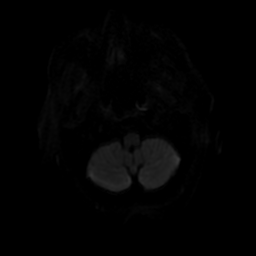
[im 33/99]
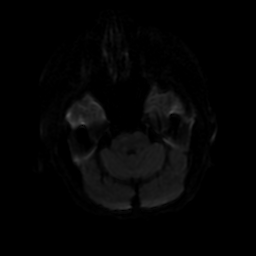
[im 50/99]
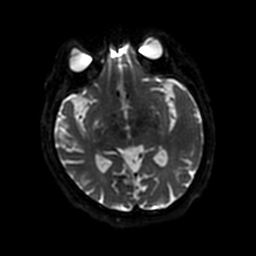
[im 66/99]
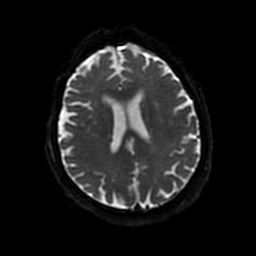
[im 82/99]
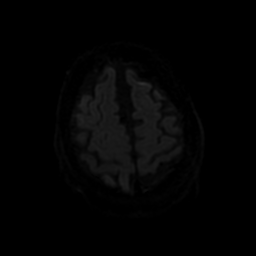
[im 99/99]
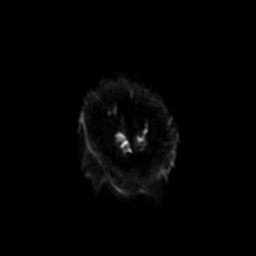

[Series 5: FLAIR fat-sat · axial · 5.0mm · 0.47mm/px · z∈[-49,+100]mm · 2 of 26 slices shown]
[im 1/26]
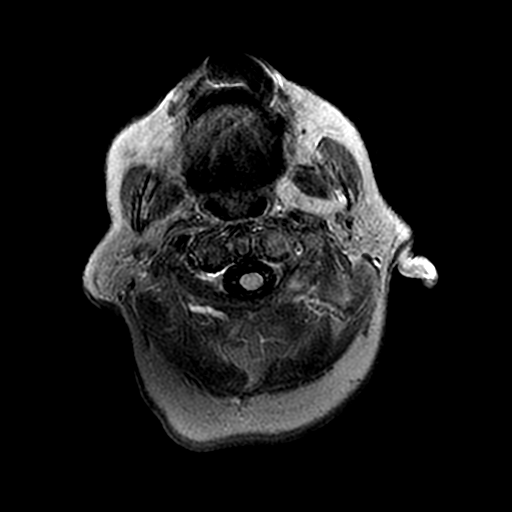
[im 26/26]
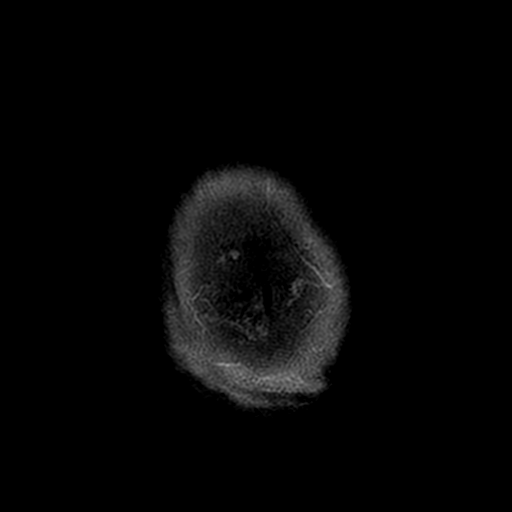

[Series 6: T2 · axial · 5.0mm · 0.47mm/px · z∈[-49,+100]mm · 2 of 26 slices shown (1 of 3)]
[im 1/26]
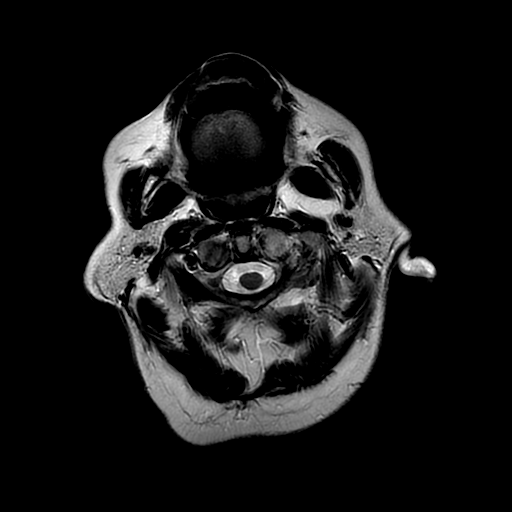
[im 26/26]
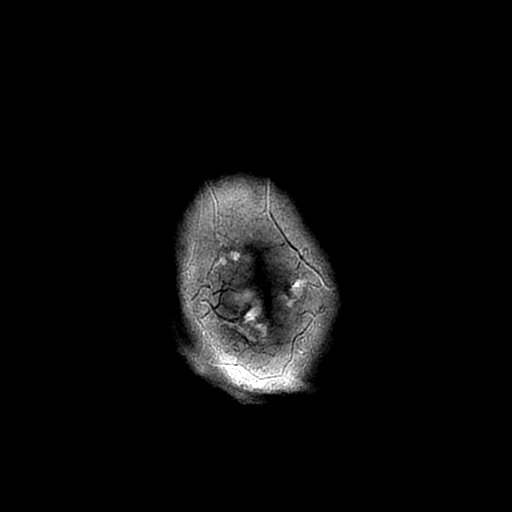

[Series 7: DWI · coronal · 5.0mm · 0.94mm/px · 5 of 68 slices shown (2 of 4)]
[im 1/68]
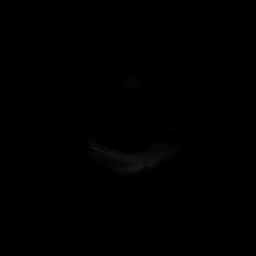
[im 17/68]
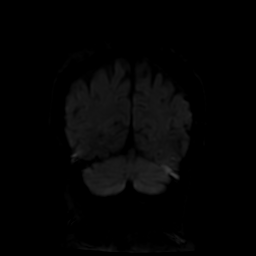
[im 34/68]
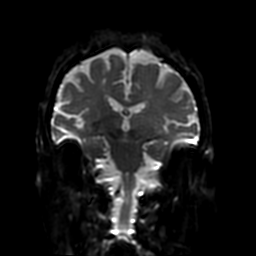
[im 51/68]
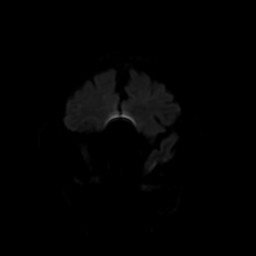
[im 68/68]
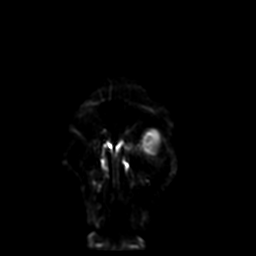

[Series 8: (person_name) · axial · 3.0mm · 0.47mm/px · z∈[-47,+2]mm · 3 of 100 slices shown]
[im 1/100]
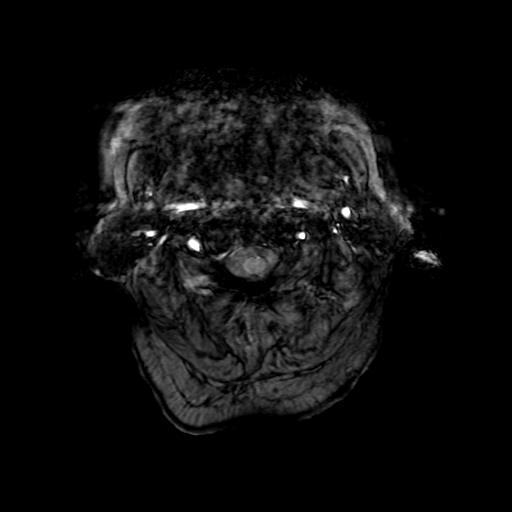
[im 17/100]
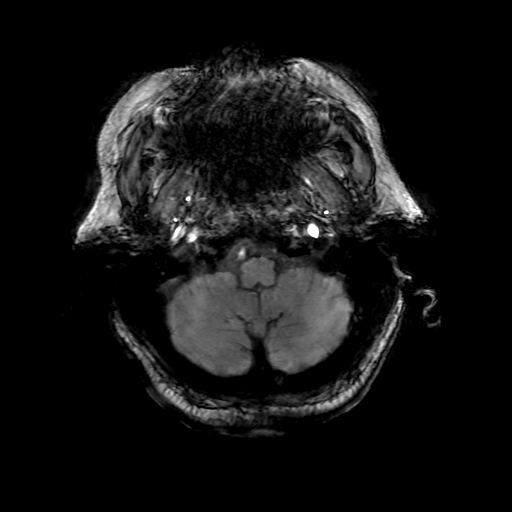
[im 34/100]
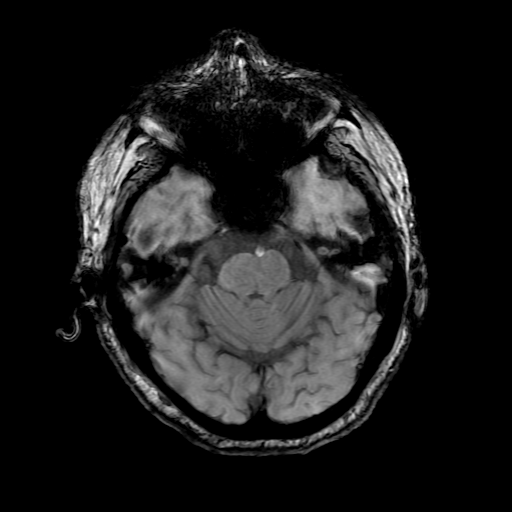

[Series 10: T2 · axial · 5.0mm · 0.47mm/px · z∈[-49,+100]mm · 2 of 26 slices shown (2 of 3)]
[im 1/26]
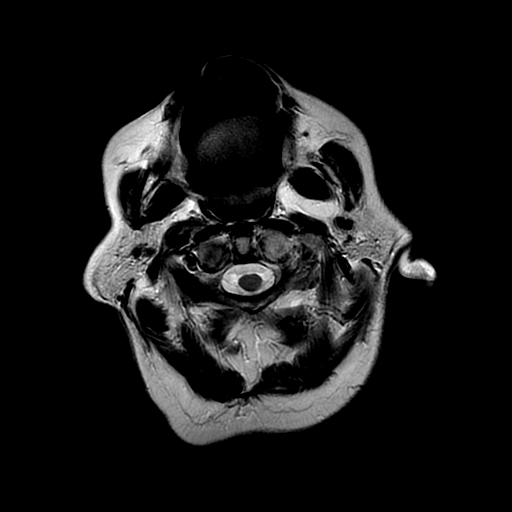
[im 26/26]
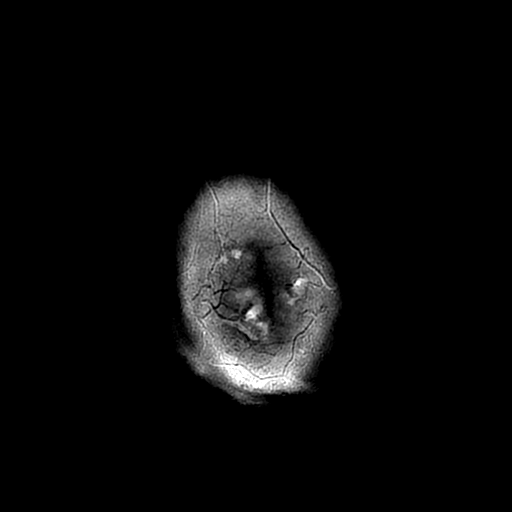

[Series 11: T2 · coronal · 5.0mm · 0.47mm/px · 2 of 29 slices shown (3 of 3)]
[im 1/29]
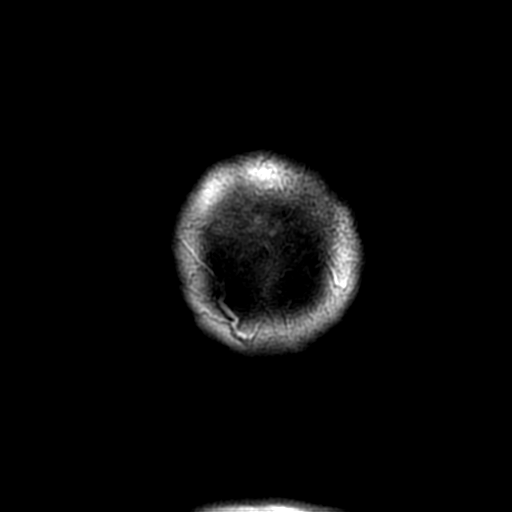
[im 29/29]
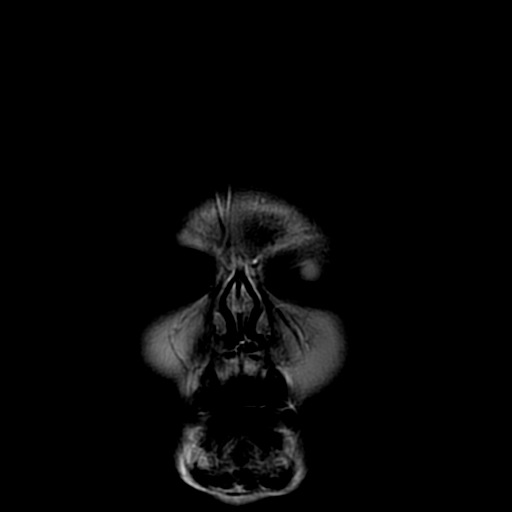

[Series 400: DWI · axial · 3.0mm · 0.94mm/px · z∈[-48,+96]mm · 4 of 49 slices shown (3 of 4)]
[im 1/49]
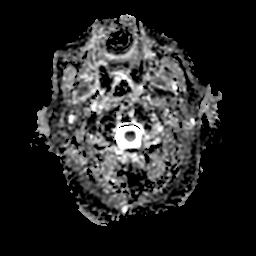
[im 17/49]
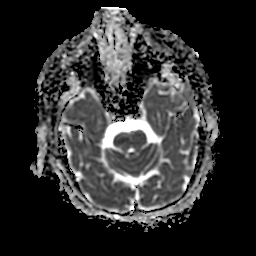
[im 33/49]
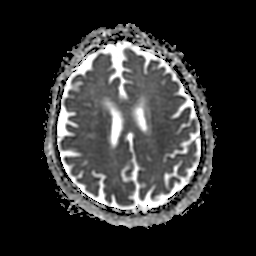
[im 49/49]
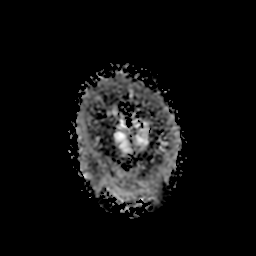

[Series 700: DWI · coronal · 5.0mm · 0.94mm/px · 2 of 32 slices shown (4 of 4)]
[im 1/32]
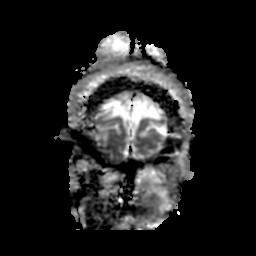
[im 32/32]
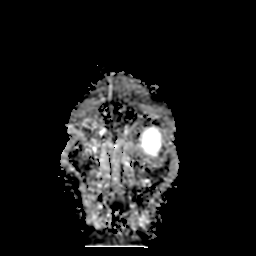

[32 of 48 positions shown; findings below may reference images not displayed]

FINDINGS: Diffusion imaging does not show any acute or subacute infarction.
The brainstem is normal. There is an old lacunar infarction within
the inferior cerebellum on the left. There are a few old small
vessel infarctions affecting the thalami and basal ganglia. There
are moderate chronic small-vessel ischemic changes affecting the
deep and subcortical hemispheric white matter. No cortical or large
vessel territory infarction. No mass lesion, acute hemorrhage,
hydrocephalus or extra-axial collection. No pituitary mass. No
inflammatory sinus disease. There are small mastoid effusions
bilaterally. No skull or skullbase lesion.
IMPRESSION: No acute or reversible process. Old small vessel insults throughout
the brain as outlined above.

## 2016-10-09 ENCOUNTER — Emergency Department: Payer: Medicare Other

## 2016-10-09 ENCOUNTER — Inpatient Hospital Stay
Admission: EM | Admit: 2016-10-09 | Discharge: 2016-10-15 | DRG: 604 | Disposition: A | Discharge status: Skilled Nursing Facility | Payer: Medicare Other | Attending: Internal Medicine | Admitting: Internal Medicine

## 2016-10-09 DIAGNOSIS — Z888 Allergy status to other drugs, medicaments and biological substances status: Secondary | ICD-10-CM

## 2016-10-09 DIAGNOSIS — H9190 Unspecified hearing loss, unspecified ear: Secondary | ICD-10-CM | POA: Diagnosis present

## 2016-10-09 DIAGNOSIS — E119 Type 2 diabetes mellitus without complications: Secondary | ICD-10-CM | POA: Diagnosis present

## 2016-10-09 DIAGNOSIS — Z833 Family history of diabetes mellitus: Secondary | ICD-10-CM

## 2016-10-09 DIAGNOSIS — Z880 Allergy status to penicillin: Secondary | ICD-10-CM

## 2016-10-09 DIAGNOSIS — F40240 Claustrophobia: Secondary | ICD-10-CM | POA: Diagnosis present

## 2016-10-09 DIAGNOSIS — W19XXXA Unspecified fall, initial encounter: Secondary | ICD-10-CM

## 2016-10-09 DIAGNOSIS — M810 Age-related osteoporosis without current pathological fracture: Secondary | ICD-10-CM | POA: Diagnosis present

## 2016-10-09 DIAGNOSIS — Z7982 Long term (current) use of aspirin: Secondary | ICD-10-CM

## 2016-10-09 DIAGNOSIS — K219 Gastro-esophageal reflux disease without esophagitis: Secondary | ICD-10-CM | POA: Diagnosis present

## 2016-10-09 DIAGNOSIS — F329 Major depressive disorder, single episode, unspecified: Secondary | ICD-10-CM | POA: Diagnosis present

## 2016-10-09 DIAGNOSIS — D62 Acute posthemorrhagic anemia: Secondary | ICD-10-CM | POA: Diagnosis present

## 2016-10-09 DIAGNOSIS — Z9181 History of falling: Secondary | ICD-10-CM

## 2016-10-09 DIAGNOSIS — F419 Anxiety disorder, unspecified: Secondary | ICD-10-CM | POA: Diagnosis present

## 2016-10-09 DIAGNOSIS — Z79899 Other long term (current) drug therapy: Secondary | ICD-10-CM

## 2016-10-09 DIAGNOSIS — M549 Dorsalgia, unspecified: Secondary | ICD-10-CM | POA: Diagnosis present

## 2016-10-09 DIAGNOSIS — M25559 Pain in unspecified hip: Secondary | ICD-10-CM

## 2016-10-09 DIAGNOSIS — S7002XA Contusion of left hip, initial encounter: Secondary | ICD-10-CM | POA: Diagnosis not present

## 2016-10-09 DIAGNOSIS — Z823 Family history of stroke: Secondary | ICD-10-CM

## 2016-10-09 DIAGNOSIS — H3552 Pigmentary retinal dystrophy: Secondary | ICD-10-CM | POA: Diagnosis present

## 2016-10-09 DIAGNOSIS — R4182 Altered mental status, unspecified: Secondary | ICD-10-CM

## 2016-10-09 DIAGNOSIS — E876 Hypokalemia: Secondary | ICD-10-CM | POA: Diagnosis present

## 2016-10-09 DIAGNOSIS — F0390 Unspecified dementia without behavioral disturbance: Secondary | ICD-10-CM | POA: Diagnosis present

## 2016-10-09 DIAGNOSIS — D638 Anemia in other chronic diseases classified elsewhere: Secondary | ICD-10-CM | POA: Diagnosis present

## 2016-10-09 DIAGNOSIS — W01198A Fall on same level from slipping, tripping and stumbling with subsequent striking against other object, initial encounter: Secondary | ICD-10-CM | POA: Diagnosis present

## 2016-10-09 DIAGNOSIS — I10 Essential (primary) hypertension: Secondary | ICD-10-CM | POA: Diagnosis present

## 2016-10-09 DIAGNOSIS — Z825 Family history of asthma and other chronic lower respiratory diseases: Secondary | ICD-10-CM

## 2016-10-09 DIAGNOSIS — Y92009 Unspecified place in unspecified non-institutional (private) residence as the place of occurrence of the external cause: Secondary | ICD-10-CM

## 2016-10-09 DIAGNOSIS — Z881 Allergy status to other antibiotic agents status: Secondary | ICD-10-CM

## 2016-10-09 DIAGNOSIS — G8929 Other chronic pain: Secondary | ICD-10-CM | POA: Diagnosis present

## 2016-10-09 DIAGNOSIS — H548 Legal blindness, as defined in USA: Secondary | ICD-10-CM | POA: Diagnosis present

## 2016-10-09 DIAGNOSIS — Z8701 Personal history of pneumonia (recurrent): Secondary | ICD-10-CM

## 2016-10-09 DIAGNOSIS — M21372 Foot drop, left foot: Secondary | ICD-10-CM | POA: Diagnosis present

## 2016-10-09 DIAGNOSIS — G9340 Encephalopathy, unspecified: Secondary | ICD-10-CM | POA: Diagnosis present

## 2016-10-09 DIAGNOSIS — M25552 Pain in left hip: Secondary | ICD-10-CM | POA: Diagnosis not present

## 2016-10-09 LAB — CBC WITH DIFFERENTIAL/PLATELET
Basophils Absolute: 0.1 10*3/uL (ref 0–0.1)
Basophils Relative: 1 %
Eosinophils Absolute: 0.1 10*3/uL (ref 0–0.7)
Eosinophils Relative: 1 %
HEMATOCRIT: 33.5 % — AB (ref 35.0–47.0)
HEMOGLOBIN: 11.6 g/dL — AB (ref 12.0–16.0)
LYMPHS ABS: 3.6 10*3/uL (ref 1.0–3.6)
Lymphocytes Relative: 35 %
MCH: 33 pg (ref 26.0–34.0)
MCHC: 34.7 g/dL (ref 32.0–36.0)
MCV: 95 fL (ref 80.0–100.0)
MONO ABS: 0.8 10*3/uL (ref 0.2–0.9)
MONOS PCT: 8 %
NEUTROS ABS: 5.6 10*3/uL (ref 1.4–6.5)
NEUTROS PCT: 55 %
Platelets: 135 10*3/uL — ABNORMAL LOW (ref 150–440)
RBC: 3.52 MIL/uL — ABNORMAL LOW (ref 3.80–5.20)
RDW: 13.2 % (ref 11.5–14.5)
WBC: 10.2 10*3/uL (ref 3.6–11.0)

## 2016-10-09 LAB — COMPREHENSIVE METABOLIC PANEL
ALBUMIN: 3.6 g/dL (ref 3.5–5.0)
ALK PHOS: 48 U/L (ref 38–126)
ALT: 6 U/L — ABNORMAL LOW (ref 14–54)
AST: 26 U/L (ref 15–41)
Anion gap: 10 (ref 5–15)
BILIRUBIN TOTAL: 0.3 mg/dL (ref 0.3–1.2)
BUN: 13 mg/dL (ref 6–20)
CO2: 30 mmol/L (ref 22–32)
CREATININE: 1.1 mg/dL — AB (ref 0.44–1.00)
Calcium: 8.4 mg/dL — ABNORMAL LOW (ref 8.9–10.3)
Chloride: 102 mmol/L (ref 101–111)
GFR calc Af Amer: 55 mL/min — ABNORMAL LOW (ref >60)
GFR, EST NON AFRICAN AMERICAN: 47 mL/min — AB (ref >60)
GLUCOSE: 241 mg/dL — AB (ref 65–99)
POTASSIUM: 2.5 mmol/L — AB (ref 3.5–5.1)
Sodium: 142 mmol/L (ref 135–145)
TOTAL PROTEIN: 5.9 g/dL — AB (ref 6.5–8.1)

## 2016-10-09 LAB — MAGNESIUM: Magnesium: 1.5 mg/dL — ABNORMAL LOW (ref 1.7–2.4)

## 2016-10-09 LAB — PROTIME-INR
INR: 1.04
Prothrombin Time: 13.6 seconds (ref 11.4–15.2)

## 2016-10-09 LAB — TYPE AND SCREEN
ABO/RH(D): B POS
Antibody Screen: NEGATIVE

## 2016-10-09 LAB — CK: CK TOTAL: 45 U/L (ref 38–234)

## 2016-10-09 LAB — GLUCOSE, CAPILLARY
GLUCOSE-CAPILLARY: 217 mg/dL — AB (ref 65–99)
Glucose-Capillary: 241 mg/dL — ABNORMAL HIGH (ref 65–99)

## 2016-10-09 LAB — APTT: aPTT: <24 seconds — ABNORMAL LOW (ref 24–36)

## 2016-10-09 MED ORDER — INSULIN ASPART 100 UNIT/ML ~~LOC~~ SOLN
0.0000 [IU] | Freq: Every day | SUBCUTANEOUS | Status: DC
  Administered 2016-10-09: 2 [IU] via SUBCUTANEOUS
  Filled 2016-10-09: qty 2

## 2016-10-09 MED ORDER — SODIUM CHLORIDE 0.9% FLUSH: 3.0000 mL | INTRAVENOUS | Status: DC | PRN

## 2016-10-09 MED ORDER — ONDANSETRON HCL 4 MG/2ML IJ SOLN
4.0000 mg | Freq: Once | INTRAMUSCULAR | Status: AC
  Administered 2016-10-09: 4 mg via INTRAVENOUS
  Filled 2016-10-09: qty 2

## 2016-10-09 MED ORDER — ENOXAPARIN SODIUM 40 MG/0.4ML ~~LOC~~ SOLN
40.0000 mg | SUBCUTANEOUS | Status: DC
  Administered 2016-10-09 – 2016-10-13 (×5): 40 mg via SUBCUTANEOUS
  Filled 2016-10-09 (×5): qty 0.4

## 2016-10-09 MED ORDER — ADULT MULTIVITAMIN W/MINERALS CH
1.0000 | ORAL_TABLET | Freq: Every day | ORAL | Status: DC
  Administered 2016-10-10 – 2016-10-15 (×6): 1 via ORAL
  Filled 2016-10-09 (×6): qty 1

## 2016-10-09 MED ORDER — SODIUM CHLORIDE 0.9 % IV SOLN: 250.0000 mL | INTRAVENOUS | Status: DC | PRN

## 2016-10-09 MED ORDER — ACETAMINOPHEN 650 MG RE SUPP: 650.0000 mg | Freq: Four times a day (QID) | RECTAL | Status: DC | PRN

## 2016-10-09 MED ORDER — MIRTAZAPINE 15 MG PO TABS
7.5000 mg | ORAL_TABLET | Freq: Every day | ORAL | Status: DC
  Administered 2016-10-10 – 2016-10-14 (×5): 7.5 mg via ORAL
  Filled 2016-10-09 (×5): qty 1

## 2016-10-09 MED ORDER — ACETAMINOPHEN 325 MG PO TABS
650.0000 mg | ORAL_TABLET | Freq: Four times a day (QID) | ORAL | Status: DC | PRN
  Administered 2016-10-10: 650 mg via ORAL
  Filled 2016-10-09: qty 2

## 2016-10-09 MED ORDER — PANTOPRAZOLE SODIUM 40 MG PO TBEC
40.0000 mg | DELAYED_RELEASE_TABLET | Freq: Two times a day (BID) | ORAL | Status: DC
  Administered 2016-10-09 – 2016-10-15 (×12): 40 mg via ORAL
  Filled 2016-10-09 (×12): qty 1

## 2016-10-09 MED ORDER — ONDANSETRON HCL 4 MG PO TABS: 4.0000 mg | ORAL_TABLET | Freq: Four times a day (QID) | ORAL | Status: DC | PRN

## 2016-10-09 MED ORDER — SODIUM CHLORIDE 0.9 % IV SOLN
30.0000 meq | Freq: Once | INTRAVENOUS | Status: AC
  Administered 2016-10-09: 30 meq via INTRAVENOUS
  Filled 2016-10-09: qty 15

## 2016-10-09 MED ORDER — OMEPRAZOLE MAGNESIUM 20 MG PO TBEC: 20.0000 mg | DELAYED_RELEASE_TABLET | Freq: Two times a day (BID) | ORAL | Status: DC

## 2016-10-09 MED ORDER — POTASSIUM CHLORIDE CRYS ER 20 MEQ PO TBCR
40.0000 meq | EXTENDED_RELEASE_TABLET | ORAL | Status: AC
  Administered 2016-10-09 – 2016-10-10 (×2): 40 meq via ORAL
  Filled 2016-10-09 (×2): qty 2

## 2016-10-09 MED ORDER — ALBUTEROL SULFATE (2.5 MG/3ML) 0.083% IN NEBU: 2.5000 mg | INHALATION_SOLUTION | RESPIRATORY_TRACT | Status: DC | PRN

## 2016-10-09 MED ORDER — IRBESARTAN 150 MG PO TABS
300.0000 mg | ORAL_TABLET | Freq: Every day | ORAL | Status: DC
  Administered 2016-10-10 – 2016-10-15 (×6): 300 mg via ORAL
  Filled 2016-10-09 (×7): qty 2

## 2016-10-09 MED ORDER — ONDANSETRON HCL 4 MG/2ML IJ SOLN: 4.0000 mg | Freq: Four times a day (QID) | INTRAMUSCULAR | Status: DC | PRN

## 2016-10-09 MED ORDER — GABAPENTIN 300 MG PO CAPS
300.0000 mg | ORAL_CAPSULE | Freq: Every day | ORAL | Status: DC
  Administered 2016-10-10 – 2016-10-14 (×5): 300 mg via ORAL
  Filled 2016-10-09 (×5): qty 1

## 2016-10-09 MED ORDER — METOPROLOL TARTRATE 50 MG PO TABS
75.0000 mg | ORAL_TABLET | Freq: Two times a day (BID) | ORAL | Status: DC
  Administered 2016-10-09 – 2016-10-15 (×11): 75 mg via ORAL
  Filled 2016-10-09 (×11): qty 1

## 2016-10-09 MED ORDER — ASPIRIN EC 81 MG PO TBEC
81.0000 mg | DELAYED_RELEASE_TABLET | Freq: Every day | ORAL | Status: DC
  Administered 2016-10-10 – 2016-10-13 (×4): 81 mg via ORAL
  Filled 2016-10-09 (×4): qty 1

## 2016-10-09 MED ORDER — ALPRAZOLAM ER 0.5 MG PO TB24
0.5000 mg | ORAL_TABLET | Freq: Every day | ORAL | Status: DC
  Administered 2016-10-09 – 2016-10-15 (×7): 0.5 mg via ORAL
  Filled 2016-10-09 (×7): qty 1

## 2016-10-09 MED ORDER — SODIUM CHLORIDE 0.9% FLUSH
3.0000 mL | Freq: Two times a day (BID) | INTRAVENOUS | Status: DC
  Administered 2016-10-09 – 2016-10-15 (×11): 3 mL via INTRAVENOUS

## 2016-10-09 MED ORDER — HYDROCODONE-ACETAMINOPHEN 7.5-325 MG PO TABS
1.0000 | ORAL_TABLET | Freq: Three times a day (TID) | ORAL | Status: DC | PRN
  Administered 2016-10-09 – 2016-10-13 (×6): 1 via ORAL
  Filled 2016-10-09 (×6): qty 1

## 2016-10-09 MED ORDER — INSULIN ASPART 100 UNIT/ML ~~LOC~~ SOLN
0.0000 [IU] | Freq: Three times a day (TID) | SUBCUTANEOUS | Status: DC
  Administered 2016-10-10: 1 [IU] via SUBCUTANEOUS
  Administered 2016-10-10: 2 [IU] via SUBCUTANEOUS
  Administered 2016-10-11: 1 [IU] via SUBCUTANEOUS
  Administered 2016-10-11 (×2): 2 [IU] via SUBCUTANEOUS
  Administered 2016-10-12: 1 [IU] via SUBCUTANEOUS
  Administered 2016-10-12: 2 [IU] via SUBCUTANEOUS
  Administered 2016-10-12: 5 [IU] via SUBCUTANEOUS
  Administered 2016-10-13 – 2016-10-14 (×2): 3 [IU] via SUBCUTANEOUS
  Administered 2016-10-15: 1 [IU] via SUBCUTANEOUS
  Filled 2016-10-09 (×2): qty 1
  Filled 2016-10-09: qty 3
  Filled 2016-10-09: qty 5
  Filled 2016-10-09: qty 1
  Filled 2016-10-09 (×2): qty 2
  Filled 2016-10-09: qty 3
  Filled 2016-10-09 (×2): qty 2

## 2016-10-09 NOTE — ED Provider Notes (Signed)
[facility] Emergency Department Provider Note  ____________________________________________   First MD Initiated Contact with Patient 10/09/16 1533     (approximate)  I have reviewed the triage vital signs and the nursing notes.   HISTORY  Chief Complaint Fall  HPI Carrie Glenn is a 76 y.o. female with a history of acute renal failure as well as diabetes was presented to the emergency department today with a trip and fall and left hip pain.  She also has reported hitting her head on the refrigerator when falling. There is no report of loss of consciousness. The patient is denying headache or neck pain at this time. However, she is now unable to walk because of an increased swelling and pain to her left hip. Initially she was able to walk after the incident. However over the last several hours the worsening pain and swelling has hindered her ambulation. She has a known left foot drop.  Given fentanyl en route with EMS and now is nauseous.   Past Medical History:  Diagnosis Date  . Acute encephalopathy 04/09/2015  . Acute renal failure ([place]) 03/09/2015  . Acute UTI 03/08/2015  . Arthritis   . Awareness of heartbeats 07/10/2014  . Blind    Legally  . Chronic back pain 03/08/2015  . Controlled type 2 diabetes mellitus without complication ([place]) 03/10/4314  . Degeneration of intervertebral disc of lumbar region 09/04/2015  . Degenerative arthritis of lumbar spine 08/15/2014  . Diabetes ([place]) 04/07/2015  . Diabetes mellitus without complication ([place])   . Difficult or painful urination 05/23/2014  . Excessive urination at night 01/31/2014  . Frequent falls 04/09/2015  . Gastro-esophageal reflux disease without esophagitis 09/19/2015  . Hard of hearing   . HCAP (healthcare-associated pneumonia) 04/09/2015  . HTN (hypertension) 04/07/2015  . Hypertension   . Hypotension 03/08/2015  . Osteoporosis   . Pain    Chronic back  . Retinitis pigmentosa of both eyes   .  Type 2 diabetes mellitus ([place]) 11/28/2014    Patient Active Problem List   Diagnosis Date Noted  . Gastro-esophageal reflux disease without esophagitis 09/19/2015  . Back pain, thoracic 09/19/2015  . Degeneration of intervertebral disc of lumbar region 09/04/2015  . Mixed incontinence 06/15/2015  . Anxiety 06/10/2015  . Controlled type 2 diabetes mellitus without complication ([place]) 40/04/6760  . HCAP (healthcare-associated pneumonia) 04/09/2015  . Acute encephalopathy 04/09/2015  . Frequent falls 04/09/2015  . Hypokalemia 04/09/2015  . HTN (hypertension) 04/07/2015  . Diabetes ([place]) 04/07/2015  . Acute renal failure ([place]) 03/09/2015  . Sepsis ([place]) 03/08/2015  . Acute UTI 03/08/2015  . Hypotension 03/08/2015  . Weakness generalized 03/08/2015  . Chronic back pain 03/08/2015  . 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 Type 2 diabetes mellitus ([place]) 11/28/2014  . Primary osteoarthritis of both knees 10/15/2014  . Pain in shoulder 08/15/2014  . 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 Degenerative arthritis of lumbar spine 08/15/2014  . Awareness of heartbeats 07/10/2014  . Difficult or painful urination 05/23/2014  . Excessive urination at night 01/31/2014    Past Surgical History:  Procedure Laterality Date  . HIATAL HERNIA REPAIR    . left foot surgery  Left    Hammer toe fixation-2nd toe  . 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 Authorizing Provider  acetaminophen (TYLENOL)  500 MG tablet Take 1,000 mg by mouth every 6 (six) hours as needed (pain). Reported on 01/05/2016    Historical Provider, MD  ALPRAZolam (XANAX XR) 0.5 MG 24 hr tablet Take 1 tablet (0.5 mg total) by mouth daily. 04/10/15   Erline Hau, MD  aspirin EC 81 MG tablet Take 81 mg by mouth daily after supper.     Historical Provider, MD  Blood  Glucose Monitoring Suppl (ACCU-CHEK COMPACT CARE KIT) KIT 1 kit by Other route See admin instructions.    Historical Provider, MD  Calcium Carbonate-Vitamin D 600-400 MG-UNIT per tablet Take 1 tablet by mouth daily.    Historical Provider, MD  cephALEXin (KEFLEX) 250 MG capsule Take 1 capsule (250 mg total) by mouth daily. Patient not taking: Reported on 10/03/2015 07/04/15   Roda Shutters, FNP  Cholecalciferol (VITAMIN D3) 2000 UNITS capsule Take 2,000 Units by mouth daily.    Historical Provider, MD  ciprofloxacin (CIPRO) 250 MG tablet Take by mouth. Reported on 01/05/2016    Historical Provider, MD  conjugated estrogens (PREMARIN) vaginal cream Place 1 Applicatorful vaginally daily. 10/22/15   Roda Shutters, FNP  cyclobenzaprine (FLEXERIL) 5 MG tablet Take by mouth. 09/19/15   Historical Provider, MD  docusate sodium (COLACE) 100 MG capsule Take 1 capsule (100 mg total) by mouth 2 (two) times daily. 03/12/15   Glendon Axe, MD  fluconazole (DIFLUCAN) 150 MG tablet Take 1 tab now and one tab in 1 week Patient not taking: Reported on 10/03/2015 08/08/15   Roda Shutters, FNP  fluticasone [facility]) 50 MCG/ACT nasal spray Place 2 sprays into the nose at bedtime as needed for allergies or rhinitis.  11/28/14   Historical Provider, MD  gabapentin (NEURONTIN) 300 MG capsule Take 300 mg by mouth daily after supper.     Historical Provider, MD  glucose blood test strip 1 each 2 (two) times daily.    Historical Provider, MD  HYDROcodone-acetaminophen (NORCO) 7.5-325 MG per tablet Take 1 tablet by mouth 3 (three) times daily as needed for moderate pain or severe pain. 05/06/15   Erline Hau, MD  levofloxacin (LEVAQUIN) 500 MG tablet Take 1 tablet (500 mg total) by mouth daily. Patient not taking: Reported on 10/03/2015 04/10/15   Erline Hau, MD  metoprolol (LOPRESSOR) 50 MG tablet Take 75 mg by mouth 2 (two) times daily.    Historical Provider, MD  mirtazapine (REMERON) 15 MG  tablet Take 15 mg by mouth daily after supper.     Historical Provider, MD  Multiple Vitamin (MULTIVITAMIN WITH MINERALS) TABS tablet Take 1 tablet by mouth daily. Centrum Silver    Historical Provider, MD  olmesartan (BENICAR) 20 MG tablet Take by mouth. 06/10/15   Historical Provider, MD  omeprazole (PRILOSEC OTC) 20 MG tablet Take 20 mg by mouth 2 (two) times daily.     Historical Provider, MD  omeprazole (PRILOSEC) 20 MG capsule Take by mouth. Reported on 10/03/2015 09/19/15 12/18/15  Historical Provider, MD  rosuvastatin (CRESTOR) 10 MG tablet Take 5 mg by mouth daily.    Historical Provider, MD  sitaGLIPtin (JANUVIA) 50 MG tablet Take 50 mg by mouth daily after supper.  08/01/14   Historical Provider, MD  solifenacin (VESICARE) 5 MG tablet Take 5 mg by mouth daily. Reported on 01/05/2016    Historical Provider, MD  terconazole (TERAZOL 3) 80 MG vaginal suppository Place 1 suppository (80 mg total) vaginally at bedtime. 08/08/15   Roda Shutters, FNP  Allergies Amlodipine; Erythromycin; Lexapro [escitalopram oxalate]; Lipitor [atorvastatin]; Macrodantin [nitrofurantoin macrocrystal]; Nitrofurantoin; Sertraline hcl; Trazodone and nefazodone; Amoxicillin; Azithromycin; Effexor [venlafaxine]; Metformin and related; and Penicillins  Family History  Problem Relation Age of Onset  . Kidney disease Neg Hx   . Bladder Cancer Neg Hx     Social History Social History  Substance Use Topics  . Smoking status: Never Smoker  . Smokeless tobacco: Never Used  . Alcohol use No    Review of Systems Constitutional: No fever/chills Eyes: No visual changes. ENT: No sore throat. Cardiovascular: Denies chest pain. Respiratory: Denies shortness of breath. Gastrointestinal: No abdominal pain.  no vomiting.  No diarrhea.  No constipation. Genitourinary: Negative for dysuria. Musculoskeletal: Negative for back pain. Skin: Negative for rash. Neurological: Negative for headaches, focal weakness or  numbness.  10-point ROS otherwise negative.  ____________________________________________   PHYSICAL EXAM:  VITAL SIGNS: ED Triage Vitals [10/09/16 1531]  Enc Vitals Group     BP 118/65     Pulse Rate 72     Resp 16     Temp 97.8 F (36.6 C)     Temp Source Oral     SpO2 96 %     Weight 120 lb (54.4 kg)     Height 5' 3"  (1.6 m)     Head Circumference      Peak Flow      Pain Score      Pain Loc      Pain Edu?      Excl. in [place]?     Constitutional: Alert and oriented. Well appearing and in no acute distress. Eyes: Conjunctivae are normal. PERRL. EOMI. Head: Atraumatic. Nose: No congestion/rhinnorhea. Mouth/Throat: Mucous membranes are moist.  Oropharynx non-erythematous. Neck: No stridor.  Tenderness palpation of the midline cervical spine. No deformity or step-off. Cardiovascular: Normal rate, regular rhythm. Grossly normal heart sounds.   Respiratory: Normal respiratory effort.  No retractions. Lungs CTAB. Gastrointestinal: Soft and nontender. No distention.  Musculoskeletal: No lower extremity edema.  No joint effusions.  Left hip with swelling from the left ASIS down to the left anterior portion of the femur. The swelling is hard but not rigid. There is tenderness palpation.  Vascularly intact distal to the site of the injury with bilateral dorsalis pedis pulses are present and equal. However, the patient does have a known left foot drop. Patient is able to flex at the hip bilaterally. Pelvis is stable and nontender. Neurologic:  Normal speech and language.  Skin:  Skin is warm, dry and intact. No rash noted. Psychiatric: Mood and affect are normal. Speech and behavior are normal.  ____________________________________________   LABS (all labs ordered are listed, but only abnormal results are displayed)  Labs Reviewed  CBC WITH DIFFERENTIAL/PLATELET - Abnormal; Notable for the following:       Result Value   RBC 3.52 (*)    Hemoglobin 11.6 (*)    HCT 33.5 (*)     Platelets 135 (*)    All other components within normal limits  COMPREHENSIVE METABOLIC PANEL - Abnormal; Notable for the following:    Potassium 2.5 (*)    Glucose, Bld 241 (*)    Creatinine, Ser 1.10 (*)    Calcium 8.4 (*)    Total Protein 5.9 (*)    ALT 6 (*)    GFR calc non Af Amer 47 (*)    GFR calc Af Amer 55 (*)    All other components within normal limits  GLUCOSE, CAPILLARY -  Abnormal; Notable for the following:    Glucose-Capillary 241 (*)    All other components within normal limits  PROTIME-INR  CK  APTT  TYPE AND SCREEN   ____________________________________________  EKG  ED ECG REPORT I, Aerion Bagdasarian,  Youlanda Roys, the attending physician, personally viewed and interpreted this ECG.   Date: 10/09/2016  EKG Time: 1652  Rate: 60  Rhythm: normal sinus rhythm  Axis: normal  Intervals:borderline qt prolongation  ST&T Change: No ST segment elevation or depression. No abnormal T-wave inversion.  ____________________________________________  RADIOLOGY  CT Cervical Spine Wo Contrast (Accession [id number]) (Order [id number])  Imaging  Date: 10/09/2016 Department: [facility] EMERGENCY DEPARTMENT Released By/Authorizing: Orbie Pyo, MD (auto-released)  Exam Information   Status Exam Begun  Exam Ended   Final [99] 10/09/2016 4:16 PM 10/09/2016 4:22 PM  PACS Images   Show images for CT Cervical Spine Wo Contrast  Study Result   CLINICAL DATA:  Patient fell about 4 hours ago. Left-sided headache.  EXAM: CT HEAD WITHOUT CONTRAST  CT CERVICAL SPINE WITHOUT CONTRAST  TECHNIQUE: Multidetector CT imaging of the head and cervical spine was performed following the standard protocol without intravenous contrast. Multiplanar CT image reconstructions of the cervical spine were also generated.  COMPARISON:  Brain MRI 04/07/2015. Head CT and cervical spine CT 04/06/2015.  FINDINGS: CT HEAD FINDINGS  Brain: There is no evidence  for acute hemorrhage, hydrocephalus, mass lesion, or abnormal extra-axial fluid collection. No definite CT evidence for acute infarction. Diffuse loss of parenchymal volume is consistent with atrophy. Patchy low attenuation in the deep hemispheric and periventricular white matter is nonspecific, but likely reflects chronic microvascular ischemic demyelination.  Vascular: Atherosclerotic calcification is visualized in the carotid arteries. No dense MCA sign. Major dural sinuses are unremarkable.  Skull: No evidence for fracture. No worrisome lytic or sclerotic lesion.  Sinuses/Orbits: The visualized paranasal sinuses and mastoid air cells are clear. Visualized portions of the globes and intraorbital fat are unremarkable.  Other: None.  CT CERVICAL SPINE FINDINGS  Alignment: Straightening of the normal cervical lordosis. No subluxation.  Skull base and vertebrae: No evidence of fracture from the skullbase to the T1 vertebral body. No worrisome lytic or sclerotic osseous abnormality.  Soft tissues and spinal canal: No prevertebral fluid or swelling. No visible canal hematoma.  Disc levels: Patient is status post ACDF with anterior plate at T7-0. Loss of intervertebral disc height with endplate degeneration noted at C4-5 and C6-7. Facets are well aligned bilaterally.  Upper chest: Negative.  Other: None.  IMPRESSION: 1. No acute intracranial abnormality. 2. Atrophy with chronic small vessel white matter ischemic disease. 3. No cervical spine fracture. 4. Loss of cervical lordosis. This can be related to patient positioning, muscle spasm or soft tissue injury.   Electronically Signed   By: Misty Stanley M.D.   On: 10/09/2016 16:29      DG Chest 1 View (Final result)  Result time 10/09/16 17:07:52  Final result by Misty Stanley, MD (10/09/16 17:07:52)           Narrative:   CLINICAL DATA: Patient fell about 4 hours ago.  EXAM: CHEST 1  VIEW  COMPARISON: 04/08/2015  FINDINGS: Lungs are hyperexpanded. The lungs are clear wiithout focal pneumonia, edema, pneumothorax or pleural effusion. Cardiopericardial silhouette is at upper limits of normal for size. Moderate hiatal hernia noted. Bones are diffusely demineralized. Telemetry leads overlie the chest.  IMPRESSION: 1. No acute cardiopulmonary findings. 2. Emphysema. 3. Moderate hiatal hernia.  Electronically Signed By: Misty Stanley M.D. On: 10/09/2016 17:07            DG HIP UNILAT WITH PELVIS 2-3 VIEWS LEFT (Final result)  Result time 10/09/16 17:08:36  Procedure changed from DG Hip Infant Unilat W or Wo Pelvis 1 View Left  Final result by Lavonia Dana, MD (10/09/16 17:08:36)           Narrative:   CLINICAL DATA: Witnessed fall 4 hours ago, LEFT hip pain and bruising  EXAM: DG HIP (WITH OR WITHOUT PELVIS) 2-3V LEFT  COMPARISON: None ; correlation CT abdomen and pelvis 01/08/2016  FINDINGS: Diffuse osseous demineralization.  Degenerative changes of both hips.  SI joint spaces grossly preserved.  Significant lateral soft tissue swelling at hip/pelvic region.  No definite acute fracture, dislocation, bone destruction.  IMPRESSION: Osseous demineralization with mild degenerative changes of both hip joints.  Lateral soft tissue swelling at LEFT lateral pelvis and hip without definite acute bony abnormalities.   Electronically Signed By: Lavonia Dana M.D. On: 10/09/2016 17:08              ____________________________________________   PROCEDURES  Procedure(s) performed:   Procedures  Critical Care performed:   ____________________________________________   INITIAL IMPRESSION / ASSESSMENT AND PLAN / ED COURSE  Pertinent labs & imaging results that were available during my care of the patient were reviewed by me and considered in my medical decision making (see chart for details).   Clinical Course    ----------------------------------------- 5:30 PM on 10/09/2016 -----------------------------------------  Left thigh compartment is now soft and the swelling has decreased. Patient found to have a potassium of 2.5. Initial report was that the fall was witnessed. However, the daughters now the bedside and says that the fall was unwitnessed. Unclear if the patient passed out secondary to her low potassium. We'll admit to the hospital for potassium repletion. The daughter also says that due to the patient's nonambulatory status at this time she is unable to take care of at her home. we'll admit to the hospital. Signed out to Dr. Posey Pronto. No evidence of compartment syndrome at this time especially because the patient's compartments are soft and the swelling has decreased.   ____________________________________________   FINAL CLINICAL IMPRESSION(S) / ED DIAGNOSES  Fall. Hypokalemia. Left hip contusion.    NEW MEDICATIONS STARTED DURING THIS VISIT:  New Prescriptions   No medications on file     Note:  This document was prepared using Dragon voice recognition software and may include unintentional dictation errors.    Orbie Pyo, MD 10/09/16 [phone]

## 2016-10-09 NOTE — H&P (Signed)
[facility] NAME: Carrie Glenn    MR#:  [id number]  DATE OF BIRTH:  [birth year]  DATE OF ADMISSION:  10/09/2016  PRIMARY CARE PHYSICIAN: Glendon Axe, MD   REQUESTING/REFERRING PHYSICIAN: Orbie Pyo, MD  CHIEF COMPLAINT:   Chief Complaint  Patient presents with  . Fall   Fall today. HISTORY OF PRESENT ILLNESS:  Carrie Glenn  is a 76 y.o. female with a known history of Hypertension, diabetes, renal failure and arthritis. The patient fell at home by accident today. She complains of generalized weakness. She denies any syncope, loss of consciousness or seizure. She complains of left hip pain and swelling. X-ray didn't show any hip fracture. But her potassium is low at 2.5.  PAST MEDICAL HISTORY:   Past Medical History:  Diagnosis Date  . Acute encephalopathy 04/09/2015  . Acute renal failure ([place]) 03/09/2015  . Acute UTI 03/08/2015  . Arthritis   . Awareness of heartbeats 07/10/2014  . Blind    Legally  . Chronic back pain 03/08/2015  . Controlled type 2 diabetes mellitus without complication ([place]) 1/85/6314  . Degeneration of intervertebral disc of lumbar region 09/04/2015  . Degenerative arthritis of lumbar spine 08/15/2014  . Diabetes ([place]) 04/07/2015  . Diabetes mellitus without complication ([place])   . Difficult or painful urination 05/23/2014  . Excessive urination at night 01/31/2014  . Frequent falls 04/09/2015  . Gastro-esophageal reflux disease without esophagitis 09/19/2015  . Hard of hearing   . HCAP (healthcare-associated pneumonia) 04/09/2015  . HTN (hypertension) 04/07/2015  . Hypertension   . Hypotension 03/08/2015  . Osteoporosis   . Pain    Chronic back  . Retinitis pigmentosa of both eyes   . Type 2 diabetes mellitus ([place]) 11/28/2014    PAST SURGICAL HISTORY:   Past Surgical History:  Procedure Laterality Date  . HIATAL HERNIA REPAIR    . left foot surgery  Left    Hammer toe fixation-2nd toe    . TOE AMPUTATION Left    Left 4th toe, partial amputation due to wound not healing    SOCIAL HISTORY:   Social History  Substance Use Topics  . Smoking status: Never Smoker  . Smokeless tobacco: Never Used  . Alcohol use No    FAMILY HISTORY:   Family History  Problem Relation Age of Onset  . Stroke Mother   . Diabetes Mother   . Emphysema Father   . Kidney disease Neg Hx   . Bladder Cancer Neg Hx     DRUG ALLERGIES:   Allergies  Allergen Reactions  . Amlodipine Swelling  . Erythromycin Nausea Only  . Lexapro [Escitalopram Oxalate] Nausea Only  . Lipitor [Atorvastatin] Nausea And Vomiting  . Macrodantin [Nitrofurantoin Macrocrystal] Nausea Only  . Nitrofurantoin Nausea Only  . Sertraline Hcl Other (See Comments)    Patients gets jittery.  . Trazodone And Nefazodone Other (See Comments)    Patient gets insomnia.  . Amoxicillin Diarrhea, Nausea And Vomiting, Nausea Only and Rash  . Azithromycin Rash  . Effexor [Venlafaxine] Rash  . Metformin And Related Itching and Rash  . Penicillins Rash    REVIEW OF SYSTEMS:   Review of Systems  Constitutional: Positive for malaise/fatigue. Negative for chills and fever.  HENT: Negative for sore throat.   Eyes: Negative for blurred vision and double vision.  Respiratory: Negative for cough, sputum production, shortness of breath and stridor.   Cardiovascular: Negative for chest pain, orthopnea and leg  swelling.  Gastrointestinal: Negative for abdominal pain, blood in stool, diarrhea, melena, nausea and vomiting.  Genitourinary: Negative for dysuria and frequency.  Musculoskeletal: Positive for falls and joint pain. Negative for back pain.  Skin: Negative for itching and rash.  Neurological: Positive for weakness. Negative for dizziness, sensory change, speech change, focal weakness and loss of consciousness.  Psychiatric/Behavioral: Negative for depression. The patient is not nervous/anxious.     MEDICATIONS AT HOME:    Prior to Admission medications   Medication Sig Start Date End Date Taking? Authorizing Provider  ALPRAZolam (XANAX XR) 0.5 MG 24 hr tablet Take 1 tablet (0.5 mg total) by mouth daily. 04/10/15  Yes Erline Hau, MD  aspirin EC 81 MG tablet Take 81 mg by mouth daily after supper.    Yes Historical Provider, MD  gabapentin (NEURONTIN) 300 MG capsule Take 300 mg by mouth daily after supper.    Yes Historical Provider, MD  HYDROcodone-acetaminophen (NORCO) 7.5-325 MG per tablet Take 1 tablet by mouth 3 (three) times daily as needed for moderate pain or severe pain. 05/06/15  Yes Erline Hau, MD  metoprolol (LOPRESSOR) 50 MG tablet Take 75 mg by mouth 2 (two) times daily.   Yes Historical Provider, MD  mirtazapine (REMERON) 7.5 MG tablet Take 7.5 mg by mouth daily after supper.    Yes Historical Provider, MD  olmesartan (BENICAR) 20 MG tablet Take 40 mg by mouth daily.  06/10/15  Yes Historical Provider, MD  omeprazole (PRILOSEC OTC) 20 MG tablet Take 20 mg by mouth 2 (two) times daily.    Yes Historical Provider, MD  sitaGLIPtin (JANUVIA) 50 MG tablet Take 50 mg by mouth daily after supper.  08/01/14  Yes Historical Provider, MD  acetaminophen (TYLENOL) 500 MG tablet Take 1,000 mg by mouth every 6 (six) hours as needed (pain). Reported on 01/05/2016    Historical Provider, MD  Blood Glucose Monitoring Suppl (ACCU-CHEK COMPACT CARE KIT) KIT 1 kit by Other route See admin instructions.    Historical Provider, MD  glucose blood test strip 1 each 2 (two) times daily.    Historical Provider, MD  Multiple Vitamin (MULTIVITAMIN WITH MINERALS) TABS tablet Take 1 tablet by mouth daily. Centrum Silver    Historical Provider, MD      VITAL SIGNS:  Blood pressure 105/72, pulse 66, temperature 97.8 F (36.6 C), temperature source Oral, resp. rate 18, height 5' 3"  (1.6 m), weight 120 lb (54.4 kg), SpO2 96 %.  PHYSICAL EXAMINATION:  Physical Exam  GENERAL:  76 y.o.-year-old patient  lying in the bed with no acute distress. Looks fragile. EYES: Pupils equal, round, reactive to light and accommodation. No scleral icterus. Extraocular muscles intact.  HEENT: Head atraumatic, normocephalic. Oropharynx and nasopharynx clear.  NECK:  Supple, no jugular venous distention. No thyroid enlargement, no tenderness.  LUNGS: Normal breath sounds bilaterally, no wheezing, rales,rhonchi or crepitation. No use of accessory muscles of respiration.  CARDIOVASCULAR: S1, S2 normal. No murmurs, rubs, or gallops.  ABDOMEN: Soft, nontender, nondistended. Bowel sounds present. No organomegaly or mass.  EXTREMITIES: No pedal edema, cyanosis, or clubbing. Left thigh tenderness, swelling and bruises. NEUROLOGIC: Cranial nerves II through XII are intact. Muscle strength 4/5 in all extremities. Sensation intact. Gait not checked.  PSYCHIATRIC: The patient is alert and oriented x 3.  SKIN: No obvious rash, lesion, or ulcer.   LABORATORY PANEL:   CBC  Recent Labs Lab 10/09/16 1548  WBC 10.2  HGB 11.6*  HCT 33.5*  PLT 135*   ------------------------------------------------------------------------------------------------------------------  Chemistries   Recent Labs Lab 10/09/16 1548  NA 142  K 2.5*  CL 102  CO2 30  GLUCOSE 241*  BUN 13  CREATININE 1.10*  CALCIUM 8.4*  AST 26  ALT 6*  ALKPHOS 48  BILITOT 0.3   ------------------------------------------------------------------------------------------------------------------  Cardiac Enzymes No results for input(s): TROPONINI in the last 168 hours. ------------------------------------------------------------------------------------------------------------------  RADIOLOGY:  Dg Chest 1 View  Result Date: 10/09/2016 CLINICAL DATA:  Patient fell about 4 hours ago. EXAM: CHEST 1 VIEW COMPARISON:  04/08/2015 FINDINGS: Lungs are hyperexpanded. The lungs are clear wiithout focal pneumonia, edema, pneumothorax or pleural effusion.  Cardiopericardial silhouette is at upper limits of normal for size. Moderate hiatal hernia noted. Bones are diffusely demineralized. Telemetry leads overlie the chest. IMPRESSION: 1. No acute cardiopulmonary findings. 2. Emphysema. 3. Moderate hiatal hernia. Electronically Signed   By: Misty Stanley M.D.   On: 10/09/2016 17:07   Ct Head Wo Contrast  Result Date: 10/09/2016 CLINICAL DATA:  Patient fell about 4 hours ago. Left-sided headache. EXAM: CT HEAD WITHOUT CONTRAST CT CERVICAL SPINE WITHOUT CONTRAST TECHNIQUE: Multidetector CT imaging of the head and cervical spine was performed following the standard protocol without intravenous contrast. Multiplanar CT image reconstructions of the cervical spine were also generated. COMPARISON:  Brain MRI 04/07/2015. Head CT and cervical spine CT 04/06/2015. FINDINGS: CT HEAD FINDINGS Brain: There is no evidence for acute hemorrhage, hydrocephalus, mass lesion, or abnormal extra-axial fluid collection. No definite CT evidence for acute infarction. Diffuse loss of parenchymal volume is consistent with atrophy. Patchy low attenuation in the deep hemispheric and periventricular white matter is nonspecific, but likely reflects chronic microvascular ischemic demyelination. Vascular: Atherosclerotic calcification is visualized in the carotid arteries. No dense MCA sign. Major dural sinuses are unremarkable. Skull: No evidence for fracture. No worrisome lytic or sclerotic lesion. Sinuses/Orbits: The visualized paranasal sinuses and mastoid air cells are clear. Visualized portions of the globes and intraorbital fat are unremarkable. Other: None. CT CERVICAL SPINE FINDINGS Alignment: Straightening of the normal cervical lordosis. No subluxation. Skull base and vertebrae: No evidence of fracture from the skullbase to the T1 vertebral body. No worrisome lytic or sclerotic osseous abnormality. Soft tissues and spinal canal: No prevertebral fluid or swelling. No visible canal  hematoma. Disc levels: Patient is status post ACDF with anterior plate at J4-4. Loss of intervertebral disc height with endplate degeneration noted at C4-5 and C6-7. Facets are well aligned bilaterally. Upper chest: Negative. Other: None. IMPRESSION: 1. No acute intracranial abnormality. 2. Atrophy with chronic small vessel white matter ischemic disease. 3. No cervical spine fracture. 4. Loss of cervical lordosis. This can be related to patient positioning, muscle spasm or soft tissue injury. Electronically Signed   By: Misty Stanley M.D.   On: 10/09/2016 16:29   Ct Cervical Spine Wo Contrast  Result Date: 10/09/2016 CLINICAL DATA:  Patient fell about 4 hours ago. Left-sided headache. EXAM: CT HEAD WITHOUT CONTRAST CT CERVICAL SPINE WITHOUT CONTRAST TECHNIQUE: Multidetector CT imaging of the head and cervical spine was performed following the standard protocol without intravenous contrast. Multiplanar CT image reconstructions of the cervical spine were also generated. COMPARISON:  Brain MRI 04/07/2015. Head CT and cervical spine CT 04/06/2015. FINDINGS: CT HEAD FINDINGS Brain: There is no evidence for acute hemorrhage, hydrocephalus, mass lesion, or abnormal extra-axial fluid collection. No definite CT evidence for acute infarction. Diffuse loss of parenchymal volume is consistent with atrophy. Patchy low attenuation in the deep hemispheric and periventricular white  matter is nonspecific, but likely reflects chronic microvascular ischemic demyelination. Vascular: Atherosclerotic calcification is visualized in the carotid arteries. No dense MCA sign. Major dural sinuses are unremarkable. Skull: No evidence for fracture. No worrisome lytic or sclerotic lesion. Sinuses/Orbits: The visualized paranasal sinuses and mastoid air cells are clear. Visualized portions of the globes and intraorbital fat are unremarkable. Other: None. CT CERVICAL SPINE FINDINGS Alignment: Straightening of the normal cervical lordosis. No  subluxation. Skull base and vertebrae: No evidence of fracture from the skullbase to the T1 vertebral body. No worrisome lytic or sclerotic osseous abnormality. Soft tissues and spinal canal: No prevertebral fluid or swelling. No visible canal hematoma. Disc levels: Patient is status post ACDF with anterior plate at B7-1. Loss of intervertebral disc height with endplate degeneration noted at C4-5 and C6-7. Facets are well aligned bilaterally. Upper chest: Negative. Other: None. IMPRESSION: 1. No acute intracranial abnormality. 2. Atrophy with chronic small vessel white matter ischemic disease. 3. No cervical spine fracture. 4. Loss of cervical lordosis. This can be related to patient positioning, muscle spasm or soft tissue injury. Electronically Signed   By: Misty Stanley M.D.   On: 10/09/2016 16:29   Dg Hip Unilat With Pelvis 2-3 Views Left  Result Date: 10/09/2016 CLINICAL DATA:  Witnessed fall 4 hours ago, LEFT hip pain and bruising EXAM: DG HIP (WITH OR WITHOUT PELVIS) 2-3V LEFT COMPARISON:  None ; correlation CT abdomen and pelvis 01/08/2016 FINDINGS: Diffuse osseous demineralization. Degenerative changes of both hips. SI joint spaces grossly preserved. Significant lateral soft tissue swelling at hip/pelvic region. No definite acute fracture, dislocation, bone destruction. IMPRESSION: Osseous demineralization with mild degenerative changes of both hip joints. Lateral soft tissue swelling at LEFT lateral pelvis and hip without definite acute bony abnormalities. Electronically Signed   By: Lavonia Dana M.D.   On: 10/09/2016 17:08      IMPRESSION AND PLAN:   Hypokalemia. Patient will be placed for observation. Give potassium supplement IV and by mouth, follow-up BMP and magnesium level.  Fall and generalized weakness. Fall precaution and physical therapy evaluation.  Hypertension. Continue home hypertension medication.  Diabetes. Start sliding scale.  All the records are reviewed and case  discussed with ED provider. Management plans discussed with the patient, Her daughter-in-law and they are in agreement.  CODE STATUS: Full code  TOTAL TIME TAKING CARE OF THIS PATIENT: 47 minutes.    Demetrios Loll M.D on 10/09/2016 at 6:07 PM  Between 7am to 6pm - Pager - [phone]  After 6pm go to [email or web address] - Proofreader  [facility]  Office  [phone]  CC: Primary care physician; Glendon Axe, MD   Note: This dictation was prepared with Dragon dictation along with smaller phrase technology. Any transcriptional errors that result from this process are unintentional.

## 2016-10-09 NOTE — ED Notes (Signed)
Meal and ginger ale given to pt.

## 2016-10-09 NOTE — ED Notes (Signed)
Called floor and attempted to give report. Was informed RN would return call.

## 2016-10-09 NOTE — ED Triage Notes (Signed)
Pt came to ED via EMS from home, witnessed by daughter. Took fall about 4 hours ago, bruising to left hip and left side of head. Reports pain in left leg. VS stable.

## 2016-10-09 NOTE — ED Notes (Signed)
Pt assisted on bedpan. Pt urinated. Cleaned and new brief applied.

## 2016-10-10 ENCOUNTER — Observation Stay: Payer: Medicare Other

## 2016-10-10 LAB — BASIC METABOLIC PANEL
Anion gap: 6 (ref 5–15)
BUN: 16 mg/dL (ref 6–20)
CALCIUM: 7.8 mg/dL — AB (ref 8.9–10.3)
CO2: 31 mmol/L (ref 22–32)
CREATININE: 1.16 mg/dL — AB (ref 0.44–1.00)
Chloride: 105 mmol/L (ref 101–111)
GFR calc non Af Amer: 44 mL/min — ABNORMAL LOW (ref >60)
GFR, EST AFRICAN AMERICAN: 51 mL/min — AB (ref >60)
Glucose, Bld: 123 mg/dL — ABNORMAL HIGH (ref 65–99)
Potassium: 3.1 mmol/L — ABNORMAL LOW (ref 3.5–5.1)
SODIUM: 142 mmol/L (ref 135–145)

## 2016-10-10 LAB — CBC
HCT: 24.5 % — ABNORMAL LOW (ref 35.0–47.0)
Hemoglobin: 8.6 g/dL — ABNORMAL LOW (ref 12.0–16.0)
MCH: 32.5 pg (ref 26.0–34.0)
MCHC: 35.1 g/dL (ref 32.0–36.0)
MCV: 92.8 fL (ref 80.0–100.0)
PLATELETS: 92 10*3/uL — AB (ref 150–440)
RBC: 2.64 MIL/uL — AB (ref 3.80–5.20)
RDW: 13.6 % (ref 11.5–14.5)
WBC: 9.3 10*3/uL (ref 3.6–11.0)

## 2016-10-10 LAB — GLUCOSE, CAPILLARY
GLUCOSE-CAPILLARY: 112 mg/dL — AB (ref 65–99)
GLUCOSE-CAPILLARY: 141 mg/dL — AB (ref 65–99)
GLUCOSE-CAPILLARY: 161 mg/dL — AB (ref 65–99)
Glucose-Capillary: 200 mg/dL — ABNORMAL HIGH (ref 65–99)

## 2016-10-10 MED ORDER — LORAZEPAM BOLUS VIA INFUSION: 0.5000 mg | Freq: Once | INTRAVENOUS | Status: DC

## 2016-10-10 MED ORDER — MAGNESIUM SULFATE 2 GM/50ML IV SOLN
2.0000 g | Freq: Once | INTRAVENOUS | Status: AC
  Administered 2016-10-10: 2 g via INTRAVENOUS
  Filled 2016-10-10: qty 50

## 2016-10-10 MED ORDER — POTASSIUM CHLORIDE CRYS ER 20 MEQ PO TBCR
40.0000 meq | EXTENDED_RELEASE_TABLET | ORAL | Status: AC
  Administered 2016-10-10 (×2): 40 meq via ORAL
  Filled 2016-10-10 (×2): qty 2

## 2016-10-10 MED ORDER — LORAZEPAM 2 MG/ML IJ SOLN
0.5000 mg | Freq: Once | INTRAMUSCULAR | Status: AC
  Administered 2016-10-10: 0.5 mg via INTRAVENOUS
  Filled 2016-10-10: qty 1

## 2016-10-10 NOTE — Progress Notes (Addendum)
Physical Therapy Evaluation Patient Details Name: Carrie Glenn MRN: [id number] DOB: 04/13/1941 Today's Date: 10/10/2016   History of Present Illness  Carrie Glenn  is a 76 y.o. female with a known history of Hypertension, diabetes, renal failure and arthritis. The patient fell at home by accident today. She complains of generalized weakness. She denies any syncope, loss of consciousness or seizure. She complains of left hip pain and swelling. X-ray didn't show any hip fracture  Clinical Impression  This 76 y. O. Patient reports that she fell and hurt her left hip. She has decreased strength BLE and BUE and trunk weakness. She has decreased ROM L knee that limits her ability to perform bed mobility. She needs max assist for supine to sit bed mobility and sit to supine bed mobility. She transfers sit to stand with HHA and max assist but is not able to stand without max assist due to weakness and L hip pain. She has poor sitting balance and needs min assist for trunk support. She lives with daughter and reports walking with a RW prior to this fall. She will benefit from skilled PT to improve mobility and transfers.She is HOH and blind and communication is difficult but she does follow commands and is pleasant.     Follow Up Recommendations SNF    Equipment Recommendations       Recommendations for Other Services       Precautions / Restrictions Precautions Precaution Comments: fall Restrictions Weight Bearing Restrictions: No      Mobility  Bed Mobility Overal bed mobility: Needs Assistance             General bed mobility comments:  (mod assist supine to sit)  Transfers Overall transfer level: Needs assistance Equipment used:  (HHA sit to stand)             General transfer comment:  (HHA max assist sit to stand)  Ambulation/Gait Ambulation/Gait assistance:  (unable, poor standing balance)              Stairs            Wheelchair Mobility    Modified  Rankin (Stroke Patients Only)       Balance Overall balance assessment: History of Falls                               Standardized Balance Assessment Standardized Balance Assessment :  (poor sitting balance, poor standing balance)           Pertinent Vitals/Pain Pain Assessment: Faces Pain Score:  (unable to rate) Pain Location:  (left hip) Pain Descriptors / Indicators: Discomfort Pain Intervention(s): Monitored during session    Home Living Family/patient expects to be discharged to:: Private residence Living Arrangements: Children                    Prior Function   Patient ambulated with RW from bedroom to bathroom .              Hand Dominance        Extremity/Trunk Assessment   Upper Extremity Assessment Upper Extremity Assessment: Generalized weakness    Lower Extremity Assessment Lower Extremity Assessment: Generalized weakness    Cervical / Trunk Assessment Cervical / Trunk Assessment: Other exceptions Cervical / Trunk Exceptions:  (trunk weakness sitting with min assist needed)  Communication   Communication: HOH (blind)  Cognition Arousal/Alertness: Awake/alert   Overall Cognitive Status:  Difficult to assess                      General Comments      Exercises     Assessment/Plan    PT Assessment Patient needs continued PT services  PT Problem List            PT Treatment Interventions      PT Goals (Current goals can be found in the Care Plan section)  Acute Rehab PT Goals Patient Stated Goal:  (no stated goals) PT Goal Formulation: Patient unable to participate in goal setting Potential to Achieve Goals: Good    Frequency 7X/week   Barriers to discharge        Co-evaluation               End of Session Equipment Utilized During Treatment: Gait belt          Functional Limitation: Mobility: Walking and moving around Mobility: Walking and Moving Around Current Status  (Z6109): 100 percent impaired, limited or restricted Mobility: Walking and Moving Around Goal Status (U0454): At least 80 percent but less than 100 percent impaired, limited or restricted    Time: 1015-1045 PT Time Calculation (min) (ACUTE ONLY): 30 min   Charges:   PT Evaluation $PT Eval Moderate Complexity: 1 Procedure     PT G Codes:   PT G-Codes **NOT FOR INPATIENT CLASS** Functional Limitation: Mobility: Walking and moving around Mobility: Walking and Moving Around Current Status (U9811): 100 percent impaired, limited or restricted Mobility: Walking and Moving Around Goal Status (B1478): At least 80 percent but less than 100 percent impaired, limited or restricted  Ezekiel Ina, PT, DPT  Jones Broom S 10/10/2016, 11:42 AM

## 2016-10-10 NOTE — Consult Note (Signed)
ORTHOPAEDIC CONSULTATION  REQUESTING PHYSICIAN: Bettey Costa, MD  Chief Complaint: Left hip/thigh pain  HPI: Carrie Glenn is a 76 y.o. female who complains of  left hip and thigh pain after a fall at home. Patient is legally blind and hard of hearing. She lives with her daughter at baseline. Patient uses a [place] at baseline but is an independent ambulator. Patient sustained a fall at home injuring her left hip and thigh. Patient had x-rays taken upon arrival to the emergency department which were negative for fracture. She was admitted for pain control. Patient is reported to be max assist during physical therapy evaluation. Is a dramatic change from her baseline.  Past Medical History:  Diagnosis Date  . Acute encephalopathy 04/09/2015  . Acute renal failure ([place]) 03/09/2015  . Acute UTI 03/08/2015  . Arthritis   . Awareness of heartbeats 07/10/2014  . Blind    Legally  . Chronic back pain 03/08/2015  . Controlled type 2 diabetes mellitus without complication ([place]) 7/51/0258  . Degeneration of intervertebral disc of lumbar region 09/04/2015  . Degenerative arthritis of lumbar spine 08/15/2014  . Diabetes ([place]) 04/07/2015  . Diabetes mellitus without complication ([place])   . Difficult or painful urination 05/23/2014  . Excessive urination at night 01/31/2014  . Frequent falls 04/09/2015  . Gastro-esophageal reflux disease without esophagitis 09/19/2015  . Hard of hearing   . HCAP (healthcare-associated pneumonia) 04/09/2015  . HTN (hypertension) 04/07/2015  . Hypertension   . Hypotension 03/08/2015  . Osteoporosis   . Pain    Chronic back  . Retinitis pigmentosa of both eyes   . Type 2 diabetes mellitus ([place]) 11/28/2014   Past Surgical History:  Procedure Laterality Date  . HIATAL HERNIA REPAIR    . left foot surgery  Left    Hammer toe fixation-2nd toe  . 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 Marital  status: Widowed    Spouse name: N/A  . Number of children: N/A  . Years of education: N/A   Social History Main Topics  . Smoking status: Never Smoker  . Smokeless tobacco: Never Used  . Alcohol use No  . Drug use: Unknown  . Sexual activity: Not Asked   Other Topics Concern  . None   Social History Narrative  . None   Family History  Problem Relation Age of Onset  . Stroke Mother   . Diabetes Mother   . Emphysema Father   . Kidney disease Neg Hx   . Bladder Cancer Neg Hx    Allergies  Allergen Reactions  . Amlodipine Swelling  . Erythromycin Nausea Only  . Lexapro [Escitalopram Oxalate] Nausea Only  . Lipitor [Atorvastatin] Nausea And Vomiting  . Macrodantin [Nitrofurantoin Macrocrystal] Nausea Only  . Nitrofurantoin Nausea Only  . Sertraline Hcl Other (See Comments)    Patients gets jittery.  . Trazodone And Nefazodone Other (See Comments)    Patient gets insomnia.  . Amoxicillin Diarrhea, Nausea And Vomiting, Nausea Only and Rash  . Azithromycin Rash  . Effexor [Venlafaxine] Rash  . Metformin And Related Itching and Rash  . Penicillins Rash   Prior to Admission medications   Medication Sig Start Date End Date Taking? Authorizing Provider  ALPRAZolam (XANAX XR) 0.5 MG 24 hr tablet Take 1 tablet (0.5 mg total) by mouth daily. 04/10/15  Yes Estela Leonie Green, MD  aspirin EC 81  MG tablet Take 81 mg by mouth daily after supper.    Yes Historical Provider, MD  gabapentin (NEURONTIN) 300 MG capsule Take 300 mg by mouth daily after supper.    Yes Historical Provider, MD  HYDROcodone-acetaminophen (NORCO) 7.5-325 MG per tablet Take 1 tablet by mouth 3 (three) times daily as needed for moderate pain or severe pain. 05/06/15  Yes Erline Hau, MD  metoprolol (LOPRESSOR) 50 MG tablet Take 75 mg by mouth 2 (two) times daily.   Yes Historical Provider, MD  mirtazapine (REMERON) 7.5 MG tablet Take 7.5 mg by mouth daily after supper.    Yes Historical Provider,  MD  olmesartan (BENICAR) 20 MG tablet Take 40 mg by mouth daily.  06/10/15  Yes Historical Provider, MD  omeprazole (PRILOSEC OTC) 20 MG tablet Take 20 mg by mouth 2 (two) times daily.    Yes Historical Provider, MD  sitaGLIPtin (JANUVIA) 50 MG tablet Take 50 mg by mouth daily after supper.  08/01/14  Yes Historical Provider, MD  acetaminophen (TYLENOL) 500 MG tablet Take 1,000 mg by mouth every 6 (six) hours as needed (pain). Reported on 01/05/2016    Historical Provider, MD  Blood Glucose Monitoring Suppl (ACCU-CHEK COMPACT CARE KIT) KIT 1 kit by Other route See admin instructions.    Historical Provider, MD  glucose blood test strip 1 each 2 (two) times daily.    Historical Provider, MD  Multiple Vitamin (MULTIVITAMIN WITH MINERALS) TABS tablet Take 1 tablet by mouth daily. Centrum Silver    Historical Provider, MD   Dg Chest 1 View  Result Date: 10/09/2016 CLINICAL DATA:  Patient fell about 4 hours ago. EXAM: CHEST 1 VIEW COMPARISON:  04/08/2015 FINDINGS: Lungs are hyperexpanded. The lungs are clear wiithout focal pneumonia, edema, pneumothorax or pleural effusion. Cardiopericardial silhouette is at upper limits of normal for size. Moderate hiatal hernia noted. Bones are diffusely demineralized. Telemetry leads overlie the chest. IMPRESSION: 1. No acute cardiopulmonary findings. 2. Emphysema. 3. Moderate hiatal hernia. Electronically Signed   By: Misty Stanley M.D.   On: 10/09/2016 17:07   Ct Head Wo Contrast  Result Date: 10/09/2016 CLINICAL DATA:  Patient fell about 4 hours ago. Left-sided headache. EXAM: CT HEAD WITHOUT CONTRAST CT CERVICAL SPINE WITHOUT CONTRAST TECHNIQUE: Multidetector CT imaging of the head and cervical spine was performed following the standard protocol without intravenous contrast. Multiplanar CT image reconstructions of the cervical spine were also generated. COMPARISON:  Brain MRI 04/07/2015. Head CT and cervical spine CT 04/06/2015. FINDINGS: CT HEAD FINDINGS Brain: There  is no evidence for acute hemorrhage, hydrocephalus, mass lesion, or abnormal extra-axial fluid collection. No definite CT evidence for acute infarction. Diffuse loss of parenchymal volume is consistent with atrophy. Patchy low attenuation in the deep hemispheric and periventricular white matter is nonspecific, but likely reflects chronic microvascular ischemic demyelination. Vascular: Atherosclerotic calcification is visualized in the carotid arteries. No dense MCA sign. Major dural sinuses are unremarkable. Skull: No evidence for fracture. No worrisome lytic or sclerotic lesion. Sinuses/Orbits: The visualized paranasal sinuses and mastoid air cells are clear. Visualized portions of the globes and intraorbital fat are unremarkable. Other: None. CT CERVICAL SPINE FINDINGS Alignment: Straightening of the normal cervical lordosis. No subluxation. Skull base and vertebrae: No evidence of fracture from the skullbase to the T1 vertebral body. No worrisome lytic or sclerotic osseous abnormality. Soft tissues and spinal canal: No prevertebral fluid or swelling. No visible canal hematoma. Disc levels: Patient is status post ACDF with anterior plate at  C5-6. Loss of intervertebral disc height with endplate degeneration noted at C4-5 and C6-7. Facets are well aligned bilaterally. Upper chest: Negative. Other: None. IMPRESSION: 1. No acute intracranial abnormality. 2. Atrophy with chronic small vessel white matter ischemic disease. 3. No cervical spine fracture. 4. Loss of cervical lordosis. This can be related to patient positioning, muscle spasm or soft tissue injury. Electronically Signed   By: Misty Stanley M.D.   On: 10/09/2016 16:29   Ct Cervical Spine Wo Contrast  Result Date: 10/09/2016 CLINICAL DATA:  Patient fell about 4 hours ago. Left-sided headache. EXAM: CT HEAD WITHOUT CONTRAST CT CERVICAL SPINE WITHOUT CONTRAST TECHNIQUE: Multidetector CT imaging of the head and cervical spine was performed following the  standard protocol without intravenous contrast. Multiplanar CT image reconstructions of the cervical spine were also generated. COMPARISON:  Brain MRI 04/07/2015. Head CT and cervical spine CT 04/06/2015. FINDINGS: CT HEAD FINDINGS Brain: There is no evidence for acute hemorrhage, hydrocephalus, mass lesion, or abnormal extra-axial fluid collection. No definite CT evidence for acute infarction. Diffuse loss of parenchymal volume is consistent with atrophy. Patchy low attenuation in the deep hemispheric and periventricular white matter is nonspecific, but likely reflects chronic microvascular ischemic demyelination. Vascular: Atherosclerotic calcification is visualized in the carotid arteries. No dense MCA sign. Major dural sinuses are unremarkable. Skull: No evidence for fracture. No worrisome lytic or sclerotic lesion. Sinuses/Orbits: The visualized paranasal sinuses and mastoid air cells are clear. Visualized portions of the globes and intraorbital fat are unremarkable. Other: None. CT CERVICAL SPINE FINDINGS Alignment: Straightening of the normal cervical lordosis. No subluxation. Skull base and vertebrae: No evidence of fracture from the skullbase to the T1 vertebral body. No worrisome lytic or sclerotic osseous abnormality. Soft tissues and spinal canal: No prevertebral fluid or swelling. No visible canal hematoma. Disc levels: Patient is status post ACDF with anterior plate at Q2-5. Loss of intervertebral disc height with endplate degeneration noted at C4-5 and C6-7. Facets are well aligned bilaterally. Upper chest: Negative. Other: None. IMPRESSION: 1. No acute intracranial abnormality. 2. Atrophy with chronic small vessel white matter ischemic disease. 3. No cervical spine fracture. 4. Loss of cervical lordosis. This can be related to patient positioning, muscle spasm or soft tissue injury. Electronically Signed   By: Misty Stanley M.D.   On: 10/09/2016 16:29   Dg Hip Unilat With Pelvis 2-3 Views  Left  Result Date: 10/09/2016 CLINICAL DATA:  Witnessed fall 4 hours ago, LEFT hip pain and bruising EXAM: DG HIP (WITH OR WITHOUT PELVIS) 2-3V LEFT COMPARISON:  None ; correlation CT abdomen and pelvis 01/08/2016 FINDINGS: Diffuse osseous demineralization. Degenerative changes of both hips. SI joint spaces grossly preserved. Significant lateral soft tissue swelling at hip/pelvic region. No definite acute fracture, dislocation, bone destruction. IMPRESSION: Osseous demineralization with mild degenerative changes of both hip joints. Lateral soft tissue swelling at LEFT lateral pelvis and hip without definite acute bony abnormalities. Electronically Signed   By: Lavonia Dana M.D.   On: 10/09/2016 17:08    Positive ROS: All other systems have been reviewed and were otherwise negative with the exception of those mentioned in the HPI and as above.  Physical Exam: General: Alert, no acute distress, hard of hearing, legally blind  MUSCULOSKELETAL: Left lower extremity: Patient's skin is intact. She has a large lateral hematoma in the left thigh. The thigh compartments are soft and compressible. She had no pain with passive hip flexion abduction, abduction or logrolling. Patient had tenderness along the lateral thigh in  the area of the hematoma. She has limited knee range of motion due to a "bad knee". Patient has intact sensation light touch, palpable pedal pulses and intact motor function distally.  Assessment: Left thigh hematoma versus possible occult fracture of the left hip.  Plan: I splinted the patient that I believe she has a hematoma in the left thigh. However given that she was maximal assist today and normally is an independent ambulator and concern for the possibility of an occult fracture. I'm recommending MRI evaluation of the left hip. I reviewed the x-rays from the ER which demonstrated no evidence of fracture, with extensive soft tissue edema.I will review the MRI once it is been completed.  I also spoke with the patient's daughter by phone who agreed with the plan for MRI.   I discussed this case with Dr. Benjie Karvonen, the hospitalist, who recommended a half milligram of Ativan IV for patient's claustrophobia during the MRI.  I will provide weightbearing recommendations once the MRI has been completed. Hold on further physical therapy until MRI has been completed.    Thornton Park, MD    10/10/2016 1:28 PM

## 2016-10-10 NOTE — Progress Notes (Signed)
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  Negative for chills, fever and malaise/fatigue.  HENT: Positive for hearing loss. Negative for ear discharge, ear pain, nosebleeds and sore throat.   Eyes: Negative.  Negative for blurred vision and pain.  Respiratory: Negative.  Negative for cough, hemoptysis, shortness of breath and wheezing.   Cardiovascular: Negative.  Negative for chest pain, palpitations and leg swelling.  Gastrointestinal: Negative.  Negative for abdominal pain, blood in stool, diarrhea, nausea and vomiting.  Genitourinary: Negative.  Negative for dysuria.  Musculoskeletal: Positive for falls and joint pain. Negative for back pain.  Skin: Negative.   Neurological: Negative for dizziness, tremors, speech change, focal weakness, seizures and headaches.  Endo/Heme/Allergies: Negative.  Does not bruise/bleed easily.  Psychiatric/Behavioral: Negative.  Negative for depression, hallucinations and suicidal ideas.    Tolerating Diet:  yes      DRUG ALLERGIES:   Allergies  Allergen Reactions  . Amlodipine Swelling  . Erythromycin Nausea Only  . Lexapro [Escitalopram Oxalate] Nausea Only  . Lipitor [Atorvastatin] Nausea And Vomiting  . Macrodantin [Nitrofurantoin Macrocrystal] Nausea Only  . Nitrofurantoin Nausea Only  . Sertraline Hcl Other (See Comments)    Patients gets jittery.  . Trazodone And Nefazodone Other (See Comments)    Patient gets insomnia.  . Amoxicillin Diarrhea, Nausea And Vomiting, Nausea Only and Rash  . Azithromycin Rash  . Effexor [Venlafaxine] Rash  . Metformin And Related Itching and Rash  . Penicillins Rash    VITALS:  Blood pressure (!) 150/67, pulse 66, temperature 98.2 F (36.8 C),  temperature source Oral, resp. rate 16, height 5\' 3"  (1.6 m), weight 53.7 kg (118 lb 6.4 oz), SpO2 98 %.  PHYSICAL EXAMINATION:   Physical Exam  Constitutional: She is oriented to person, place, and time and well-developed, well-nourished, and in no distress. No distress.  HENT:  Head: Normocephalic.  Hear of hearing  Eyes: No scleral icterus.  Neck: Normal range of motion. Neck supple. No JVD present. No tracheal deviation present.  Cardiovascular: Normal rate, regular rhythm and normal heart sounds.  Exam reveals no gallop and no friction rub.   No murmur heard. Pulmonary/Chest: Effort normal and breath sounds normal. No respiratory distress. She has no wheezes. She has no rales. She exhibits no tenderness.  Abdominal: Soft. Bowel sounds are normal. She exhibits no distension and no mass. There is no tenderness. There is no rebound and no guarding.  Musculoskeletal: Normal range of motion. She exhibits tenderness. She exhibits no edema.  Tenderness left hip she is able to move hip laterally and medially. There is swelling and some bruising at the left hip Left foot drop old  Neurological: She is alert and oriented to person, place, and time.  Skin: Skin is warm. No rash noted. No erythema.  Psychiatric: Affect and judgment normal.      LABORATORY PANEL:   CBC  Recent Labs Lab 10/10/16 0348  WBC 9.3  HGB 8.6*  HCT 24.5*  PLT 92*   ------------------------------------------------------------------------------------------------------------------  Chemistries   Recent Labs Lab 10/09/16 1548 10/10/16 0348  NA 142 142  K 2.5* 3.1*  CL 102 105  CO2 30 31  GLUCOSE 241* 123*  BUN 13 16  CREATININE 1.10*  1.16*  CALCIUM 8.4* 7.8*  MG 1.5*  --   AST 26  --   ALT 6*  --   ALKPHOS 48  --   BILITOT 0.3  --    ------------------------------------------------------------------------------------------------------------------  Cardiac Enzymes No results for input(s):  TROPONINI in the last 168 hours. ------------------------------------------------------------------------------------------------------------------  RADIOLOGY:  Dg Chest 1 View  Result Date: 10/09/2016 CLINICAL DATA:  Patient fell about 4 hours ago. EXAM: CHEST 1 VIEW COMPARISON:  04/08/2015 FINDINGS: Lungs are hyperexpanded. The lungs are clear wiithout focal pneumonia, edema, pneumothorax or pleural effusion. Cardiopericardial silhouette is at upper limits of normal for size. Moderate hiatal hernia noted. Bones are diffusely demineralized. Telemetry leads overlie the chest. IMPRESSION: 1. No acute cardiopulmonary findings. 2. Emphysema. 3. Moderate hiatal hernia. Electronically Signed   By: Kennith Center M.D.   On: 10/09/2016 17:07   Ct Head Wo Contrast  Result Date: 10/09/2016 CLINICAL DATA:  Patient fell about 4 hours ago. Left-sided headache. EXAM: CT HEAD WITHOUT CONTRAST CT CERVICAL SPINE WITHOUT CONTRAST TECHNIQUE: Multidetector CT imaging of the head and cervical spine was performed following the standard protocol without intravenous contrast. Multiplanar CT image reconstructions of the cervical spine were also generated. COMPARISON:  Brain MRI 04/07/2015. Head CT and cervical spine CT 04/06/2015. FINDINGS: CT HEAD FINDINGS Brain: There is no evidence for acute hemorrhage, hydrocephalus, mass lesion, or abnormal extra-axial fluid collection. No definite CT evidence for acute infarction. Diffuse loss of parenchymal volume is consistent with atrophy. Patchy low attenuation in the deep hemispheric and periventricular white matter is nonspecific, but likely reflects chronic microvascular ischemic demyelination. Vascular: Atherosclerotic calcification is visualized in the carotid arteries. No dense MCA sign. Major dural sinuses are unremarkable. Skull: No evidence for fracture. No worrisome lytic or sclerotic lesion. Sinuses/Orbits: The visualized paranasal sinuses and mastoid air cells are clear.  Visualized portions of the globes and intraorbital fat are unremarkable. Other: None. CT CERVICAL SPINE FINDINGS Alignment: Straightening of the normal cervical lordosis. No subluxation. Skull base and vertebrae: No evidence of fracture from the skullbase to the T1 vertebral body. No worrisome lytic or sclerotic osseous abnormality. Soft tissues and spinal canal: No prevertebral fluid or swelling. No visible canal hematoma. Disc levels: Patient is status post ACDF with anterior plate at Y7-8. Loss of intervertebral disc height with endplate degeneration noted at C4-5 and C6-7. Facets are well aligned bilaterally. Upper chest: Negative. Other: None. IMPRESSION: 1. No acute intracranial abnormality. 2. Atrophy with chronic small vessel white matter ischemic disease. 3. No cervical spine fracture. 4. Loss of cervical lordosis. This can be related to patient positioning, muscle spasm or soft tissue injury. Electronically Signed   By: Kennith Center M.D.   On: 10/09/2016 16:29   Ct Cervical Spine Wo Contrast  Result Date: 10/09/2016 CLINICAL DATA:  Patient fell about 4 hours ago. Left-sided headache. EXAM: CT HEAD WITHOUT CONTRAST CT CERVICAL SPINE WITHOUT CONTRAST TECHNIQUE: Multidetector CT imaging of the head and cervical spine was performed following the standard protocol without intravenous contrast. Multiplanar CT image reconstructions of the cervical spine were also generated. COMPARISON:  Brain MRI 04/07/2015. Head CT and cervical spine CT 04/06/2015. FINDINGS: CT HEAD FINDINGS Brain: There is no evidence for acute hemorrhage, hydrocephalus, mass lesion, or abnormal extra-axial fluid collection. No definite CT evidence for acute infarction. Diffuse loss of parenchymal volume is consistent with atrophy. Patchy low attenuation in the deep hemispheric and periventricular white matter is nonspecific, but likely reflects chronic microvascular ischemic demyelination. Vascular: Atherosclerotic calcification is  visualized in the carotid arteries. No dense MCA sign. Major dural sinuses are unremarkable. Skull: No evidence for fracture. No worrisome lytic or sclerotic lesion. Sinuses/Orbits: The visualized paranasal sinuses and mastoid air cells are clear. Visualized portions of the globes and intraorbital fat are unremarkable. Other: None. CT CERVICAL SPINE FINDINGS Alignment: Straightening of the normal cervical lordosis. No subluxation. Skull base and vertebrae: No evidence of fracture from the skullbase to the T1 vertebral body. No worrisome lytic or sclerotic osseous abnormality. Soft tissues and spinal canal: No prevertebral fluid or swelling. No visible canal hematoma. Disc levels: Patient is status post ACDF with anterior plate at K4-4C5-6. Loss of intervertebral disc height with endplate degeneration noted at C4-5 and C6-7. Facets are well aligned bilaterally. Upper chest: Negative. Other: None. IMPRESSION: 1. No acute intracranial abnormality. 2. Atrophy with chronic small vessel white matter ischemic disease. 3. No cervical spine fracture. 4. Loss of cervical lordosis. This can be related to patient positioning, muscle spasm or soft tissue injury. Electronically Signed   By: Kennith CenterEric  Mansell M.D.   On: 10/09/2016 16:29   Dg Hip Unilat With Pelvis 2-3 Views Left  Result Date: 10/09/2016 CLINICAL DATA:  Witnessed fall 4 hours ago, LEFT hip pain and bruising EXAM: DG HIP (WITH OR WITHOUT PELVIS) 2-3V LEFT COMPARISON:  None ; correlation CT abdomen and pelvis 01/08/2016 FINDINGS: Diffuse osseous demineralization. Degenerative changes of both hips. SI joint spaces grossly preserved. Significant lateral soft tissue swelling at hip/pelvic region. No definite acute fracture, dislocation, bone destruction. IMPRESSION: Osseous demineralization with mild degenerative changes of both hip joints. Lateral soft tissue swelling at LEFT lateral pelvis and hip without definite acute bony abnormalities. Electronically Signed   By: Ulyses SouthwardMark   Boles M.D.   On: 10/09/2016 17:08     ASSESSMENT AND PLAN:   76 y/o female With history of diabetes who presented to ED after a mechanical fall and complaining of left hip pain.  1. Left hip pain with swelling no evidence of fracture on x-ray Physical therapy consultation Ortho consult for need for further imaging Supportive care with pain management  2. Hypokalemia: Replete and check magnesium level  3. Essential hypertension: Continue Avapro and metoprolol  4. Diabetes: Continue ADA diet and sliding scale insulin  5. Depression on Remeron   Left message for daughter    Management plans discussed with the patient and she is in agreement.  CODE STATUS: full  TOTAL TIME TAKING CARE OF THIS PATIENT: 30 minutes.     POSSIBLE D/C 1-2 days, DEPENDING ON CLINICAL CONDITION.   Oley Lahaie M.D on 10/10/2016 at 7:44 AM  Between 7am to 6pm - Pager - [phone] After 6pm go to [email or web address] - password [email or web address]  [facility]  Office  [phone]  CC: Primary care physician; Leotis ShamesSingh,Jasmine, MD  Note: This dictation was prepared with Dragon dictation along with smaller phrase technology. Any transcriptional errors that result from this process are unintentional.

## 2016-10-10 NOTE — Clinical Social Work Note (Signed)
CSW spoke with patient's daughter regarding observation status and PT recommendation for STR. The patient''s daughter reported that the patient has [facility] connection. CSW informed RNCM for handoff as [facility] not accessible on weekend. CSW following.  Argentina PonderKaren Martha Vern Guerette, MSW, Theresia MajorsLCSWA [phone]

## 2016-10-11 ENCOUNTER — Observation Stay: Payer: Medicare Other

## 2016-10-11 LAB — URINALYSIS, ROUTINE W REFLEX MICROSCOPIC
BILIRUBIN URINE: NEGATIVE
Glucose, UA: NEGATIVE mg/dL
HGB URINE DIPSTICK: NEGATIVE
KETONES UR: NEGATIVE mg/dL
Leukocytes, UA: NEGATIVE
Nitrite: NEGATIVE
Protein, ur: NEGATIVE mg/dL
SPECIFIC GRAVITY, URINE: 1.013 (ref 1.005–1.030)
pH: 7 (ref 5.0–8.0)

## 2016-10-11 LAB — CBC
HCT: 24.6 % — ABNORMAL LOW (ref 35.0–47.0)
HEMOGLOBIN: 8.8 g/dL — AB (ref 12.0–16.0)
MCH: 33.2 pg (ref 26.0–34.0)
MCHC: 35.6 g/dL (ref 32.0–36.0)
MCV: 93.2 fL (ref 80.0–100.0)
Platelets: 96 10*3/uL — ABNORMAL LOW (ref 150–440)
RBC: 2.64 MIL/uL — AB (ref 3.80–5.20)
RDW: 13.4 % (ref 11.5–14.5)
WBC: 7.4 10*3/uL (ref 3.6–11.0)

## 2016-10-11 LAB — GLUCOSE, CAPILLARY
GLUCOSE-CAPILLARY: 197 mg/dL — AB (ref 65–99)
GLUCOSE-CAPILLARY: 141 mg/dL — AB (ref 65–99)
Glucose-Capillary: 179 mg/dL — ABNORMAL HIGH (ref 65–99)
Glucose-Capillary: 158 mg/dL — ABNORMAL HIGH (ref 65–99)

## 2016-10-11 LAB — CREATININE, SERUM
Creatinine, Ser: 1.14 mg/dL — ABNORMAL HIGH (ref 0.44–1.00)
GFR calc non Af Amer: 45 mL/min — ABNORMAL LOW (ref >60)
GFR, EST AFRICAN AMERICAN: 52 mL/min — AB (ref >60)

## 2016-10-11 LAB — POTASSIUM: POTASSIUM: 3.6 mmol/L (ref 3.5–5.1)

## 2016-10-11 LAB — AMMONIA: Ammonia: <9 umol/L — ABNORMAL LOW (ref 9–35)

## 2016-10-11 LAB — TSH: TSH: 1.904 u[IU]/mL (ref 0.350–4.500)

## 2016-10-11 NOTE — Progress Notes (Signed)
Per [facility]'s automated line eligibility system [phone]), confirmed they are considered secondary payor and not primary coverage.

## 2016-10-11 NOTE — Plan of Care (Signed)
Problem: Bowel/Gastric: Goal: Will not experience complications related to bowel motility Outcome: Progressing Pt is progressing toward goals.   

## 2016-10-11 NOTE — Progress Notes (Signed)
Shift assessment completed at 0800. 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 Caregiver from home is present. Pt is confused, alert to self only, has difficulty answering questions due to hearing. Skin is warm and dry, intact, purple area of ecchymosis is noted to L inner thigh, and emerging to pt's L hip area. Pt is on room air, lungs are clear bilat, Hr is regular, abdomen si soft, bs heard.ppp, no edema ntoed. PIV #24 intact to pt's L hand, site is free of redness and swelling. Pt denied pain. Physical therapy in to work with pt, pt required tow assisst to stand and had difficulty follow ing prompts to move her feet forward, etc to accomplish transfer to recliner. Pt incontinent of stool on the way and was placed on bedisde commode, had a large formed stool and continued to recliner. Pt returned to bed later with two assisst, total of three stool this shift. Caregiver has stayed at bedside and assisted  with meals,etc. Dr. In on round this afternoon, per family, pt's confusion is acute in nature, cxr, accomplished,pt was in and out cathed and urine sent to lab,etc. Per pt's daugther, she is very concerned about pt's observation status and stated she cannot take her home unless she is able to walk, she has spoken to MD and to case mgt.

## 2016-10-11 NOTE — Care Management Obs Status (Signed)
MEDICARE OBSERVATION STATUS NOTIFICATION   Patient Details  Name: Carrie Glenn MRN: [id number] Date of Birth: 06/17/1941   Medicare Observation Status Notification Given:  Yes    Marily MemosLisa M Gypsy Kellogg, RN 10/11/2016, 4:05 PM

## 2016-10-11 NOTE — Progress Notes (Signed)
[facility] [place] [facility]   PATIENT NAME: Carrie Glenn    MR#:  [id number]  DATE OF BIRTH:  1940-11-23  SUBJECTIVE:   Patient is here due to a left hip pain and noted to have a hematoma. Patient is also quite altered today as per the daughter-in-law at bedside. No other acute events overnight. Hemoglobin stable.  REVIEW OF SYSTEMS:    Review of Systems  Unable to perform ROS: Mental acuity   Tolerating Diet:  yes   DRUG ALLERGIES:   Allergies  Allergen Reactions  . Amlodipine Swelling  . Erythromycin Nausea Only  . Lexapro [Escitalopram Oxalate] Nausea Only  . Lipitor [Atorvastatin] Nausea And Vomiting  . Macrodantin [Nitrofurantoin Macrocrystal] Nausea Only  . Nitrofurantoin Nausea Only  . Sertraline Hcl Other (See Comments)    Patients gets jittery.  . Trazodone And Nefazodone Other (See Comments)    Patient gets insomnia.  . Amoxicillin Diarrhea, Nausea And Vomiting, Nausea Only and Rash  . Azithromycin Rash  . Effexor [Venlafaxine] Rash  . Metformin And Related Itching and Rash  . Penicillins Rash    VITALS:  Blood pressure 123/69, pulse 74, temperature 98.2 F (36.8 C), temperature source Oral, resp. rate 18, height 5\' 3"  (1.6 m), weight 53.7 kg (118 lb 6.4 oz), SpO2 97 %.  PHYSICAL EXAMINATION:   Physical Exam  Constitutional: She is well-developed, well-nourished, and in no distress. No distress.  HENT:  Head: Normocephalic.  Hear of hearing  Eyes: No scleral icterus.  Neck: Normal range of motion. Neck supple. No JVD present. No tracheal deviation present.  Cardiovascular: Normal rate, regular rhythm and normal heart sounds.  Exam reveals no gallop and no friction rub.   No murmur heard. Pulmonary/Chest: Effort normal and breath sounds normal. No respiratory distress. She has no wheezes. She has no rales. She exhibits no tenderness.  Abdominal: Soft. Bowel sounds are normal. She exhibits no distension and no mass. There is no  tenderness. There is no rebound and no guarding.  Musculoskeletal: Normal range of motion. She exhibits tenderness. She exhibits no edema.  Tenderness left hip she is able to move hip laterally and medially. There is swelling and some bruising at the left hip Left foot drop old  Neurological: She is alert.  Skin: Skin is warm. No rash noted. No erythema.  Psychiatric: Affect and judgment normal.      LABORATORY PANEL:   CBC  Recent Labs Lab 10/11/16 0357  WBC 7.4  HGB 8.8*  HCT 24.6*  PLT 96*   ------------------------------------------------------------------------------------------------------------------  Chemistries   Recent Labs Lab 10/09/16 1548 10/10/16 0348 10/11/16 0357  NA 142 142  --   K 2.5* 3.1* 3.6  CL 102 105  --   CO2 30 31  --   GLUCOSE 241* 123*  --   BUN 13 16  --   CREATININE 1.10* 1.16* 1.14*  CALCIUM 8.4* 7.8*  --   MG 1.5*  --   --   AST 26  --   --   ALT 6*  --   --   ALKPHOS 48  --   --   BILITOT 0.3  --   --    ------------------------------------------------------------------------------------------------------------------  Cardiac Enzymes No results for input(s): TROPONINI in the last 168 hours. ------------------------------------------------------------------------------------------------------------------  RADIOLOGY:  Dg Chest 1 View  Result Date: 10/09/2016 CLINICAL DATA:  Patient fell about 4 hours ago. EXAM: CHEST 1 VIEW COMPARISON:  04/08/2015 FINDINGS: Lungs are hyperexpanded. The lungs are  clear wiithout focal pneumonia, edema, pneumothorax or pleural effusion. Cardiopericardial silhouette is at upper limits of normal for size. Moderate hiatal hernia noted. Bones are diffusely demineralized. Telemetry leads overlie the chest. IMPRESSION: 1. No acute cardiopulmonary findings. 2. Emphysema. 3. Moderate hiatal hernia. Electronically Signed   By: Kennith CenterEric  Mansell M.D.   On: 10/09/2016 17:07   Ct Head Wo Contrast  Result Date:  10/09/2016 CLINICAL DATA:  Patient fell about 4 hours ago. Left-sided headache. EXAM: CT HEAD WITHOUT CONTRAST CT CERVICAL SPINE WITHOUT CONTRAST TECHNIQUE: Multidetector CT imaging of the head and cervical spine was performed following the standard protocol without intravenous contrast. Multiplanar CT image reconstructions of the cervical spine were also generated. COMPARISON:  Brain MRI 04/07/2015. Head CT and cervical spine CT 04/06/2015. FINDINGS: CT HEAD FINDINGS Brain: There is no evidence for acute hemorrhage, hydrocephalus, mass lesion, or abnormal extra-axial fluid collection. No definite CT evidence for acute infarction. Diffuse loss of parenchymal volume is consistent with atrophy. Patchy low attenuation in the deep hemispheric and periventricular white matter is nonspecific, but likely reflects chronic microvascular ischemic demyelination. Vascular: Atherosclerotic calcification is visualized in the carotid arteries. No dense MCA sign. Major dural sinuses are unremarkable. Skull: No evidence for fracture. No worrisome lytic or sclerotic lesion. Sinuses/Orbits: The visualized paranasal sinuses and mastoid air cells are clear. Visualized portions of the globes and intraorbital fat are unremarkable. Other: None. CT CERVICAL SPINE FINDINGS Alignment: Straightening of the normal cervical lordosis. No subluxation. Skull base and vertebrae: No evidence of fracture from the skullbase to the T1 vertebral body. No worrisome lytic or sclerotic osseous abnormality. Soft tissues and spinal canal: No prevertebral fluid or swelling. No visible canal hematoma. Disc levels: Patient is status post ACDF with anterior plate at Z6-1C5-6. Loss of intervertebral disc height with endplate degeneration noted at C4-5 and C6-7. Facets are well aligned bilaterally. Upper chest: Negative. Other: None. IMPRESSION: 1. No acute intracranial abnormality. 2. Atrophy with chronic small vessel white matter ischemic disease. 3. No cervical  spine fracture. 4. Loss of cervical lordosis. This can be related to patient positioning, muscle spasm or soft tissue injury. Electronically Signed   By: Kennith CenterEric  Mansell M.D.   On: 10/09/2016 16:29   Ct Cervical Spine Wo Contrast  Result Date: 10/09/2016 CLINICAL DATA:  Patient fell about 4 hours ago. Left-sided headache. EXAM: CT HEAD WITHOUT CONTRAST CT CERVICAL SPINE WITHOUT CONTRAST TECHNIQUE: Multidetector CT imaging of the head and cervical spine was performed following the standard protocol without intravenous contrast. Multiplanar CT image reconstructions of the cervical spine were also generated. COMPARISON:  Brain MRI 04/07/2015. Head CT and cervical spine CT 04/06/2015. FINDINGS: CT HEAD FINDINGS Brain: There is no evidence for acute hemorrhage, hydrocephalus, mass lesion, or abnormal extra-axial fluid collection. No definite CT evidence for acute infarction. Diffuse loss of parenchymal volume is consistent with atrophy. Patchy low attenuation in the deep hemispheric and periventricular white matter is nonspecific, but likely reflects chronic microvascular ischemic demyelination. Vascular: Atherosclerotic calcification is visualized in the carotid arteries. No dense MCA sign. Major dural sinuses are unremarkable. Skull: No evidence for fracture. No worrisome lytic or sclerotic lesion. Sinuses/Orbits: The visualized paranasal sinuses and mastoid air cells are clear. Visualized portions of the globes and intraorbital fat are unremarkable. Other: None. CT CERVICAL SPINE FINDINGS Alignment: Straightening of the normal cervical lordosis. No subluxation. Skull base and vertebrae: No evidence of fracture from the skullbase to the T1 vertebral body. No worrisome lytic or sclerotic osseous abnormality. Soft tissues and  spinal canal: No prevertebral fluid or swelling. No visible canal hematoma. Disc levels: Patient is status post ACDF with anterior plate at R6-0. Loss of intervertebral disc height with endplate  degeneration noted at C4-5 and C6-7. Facets are well aligned bilaterally. Upper chest: Negative. Other: None. IMPRESSION: 1. No acute intracranial abnormality. 2. Atrophy with chronic small vessel white matter ischemic disease. 3. No cervical spine fracture. 4. Loss of cervical lordosis. This can be related to patient positioning, muscle spasm or soft tissue injury. Electronically Signed   By: Kennith Center M.D.   On: 10/09/2016 16:29   Mr Hip Left Wo Contrast  Result Date: 10/10/2016 CLINICAL DATA:  Fall today with left hip pain and acute swelling. No fracture on radiography. EXAM: MR OF THE LEFT HIP WITHOUT CONTRAST TECHNIQUE: Multiplanar, multisequence MR imaging was performed. No intravenous contrast was administered. COMPARISON:  10/09/2016 and 01/08/2016 FINDINGS: Despite efforts by the technologist and patient, motion artifact is present on today's exam and could not be eliminated. This reduces exam sensitivity and specificity. Bones: No osseous edema to suggest fracture. Moderate degenerative arthropathy of both hips. Lower lumbar spondylosis and degenerative disc disease. Articular cartilage and labrum Articular cartilage: Chondral thinning in both hips both the axial an craniocaudad. Labrum:  Obscured by motion artifact. Joint or bursal effusion Joint effusion:  Absent Bursae:  Absent Muscles and tendons Muscles and tendons: Low-grade muscular edema in the gluteus medius muscle bilaterally and also deep to both iliacus muscles. There is also asymmetric edema tracking along the left hip adductor musculature suspicious for adductor strain. Edema tracks along the distal left gluteus maximus musculotendinous junction. Other findings Miscellaneous: Extensive subcutaneous edema overlying the left hip with a 8.6 by 4.3 by 13.3 cm (volume = 260 cm^3)complex collection tracking in the subcutaneous tissues along the superficial fascia margin of the iliotibial band and tensor fascia lata. This collection has high  T2 and intermediate T1 signal characteristics, and given the acute swelling in this vicinity this is thought represent a hematoma. Please note that hematoma along the superficial fascia margin can be due to shear injury and can sometimes require surgical intervention to prevent reaccumulation (Morel-Lavallee lesion). Small amount of presacral fluid without an appreciable sacral fracture. IMPRESSION: 1. 260 cc complex collection tracking in the subcutaneous tissues along the superficial fascia margin of the left tensor fascia lata and iliotibial band, compatible with hematoma. 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 Considerable surrounding subcutaneous edema noted. 2. Suspected strain of the left hip adductor musculature which demonstrates asymmetric edema. 3. Fairly symmetric edema involving the gluteus minimus and medius muscles and tracking deep to the iliacus muscles, possibly chronic. 4. Small amount of presacral edema is present, but I do not see a definite sacral fracture. Electronically Signed   By: Gaylyn Rong M.D.   On: 10/10/2016 15:30   Dg Hip Unilat With Pelvis 2-3 Views Left  Result Date: 10/09/2016 CLINICAL DATA:  Witnessed fall 4 hours ago, LEFT hip pain and bruising EXAM: DG HIP (WITH OR WITHOUT PELVIS) 2-3V LEFT COMPARISON:  None ; correlation CT abdomen and pelvis 01/08/2016 FINDINGS: Diffuse osseous demineralization. Degenerative changes of both hips. SI joint spaces grossly preserved. Significant lateral soft tissue swelling at hip/pelvic region. No definite acute fracture, dislocation, bone destruction. IMPRESSION: Osseous demineralization with mild degenerative changes of both hip joints. Lateral soft tissue swelling at LEFT lateral pelvis and hip without definite acute bony abnormalities. Electronically Signed   By: Ulyses Southward M.D.   On: 10/09/2016 17:08  ASSESSMENT AND PLAN:   76 y/o female With history of diabetes, Hypertension, GERD, history of frequent falls,  chronic back pain, osteoporosis, legally blind and also very hard of hearing who presented to ED after a mechanical fall and complaining of left hip pain.  1. Left hip pain with swelling-secondary to left hip hematoma is noted on the MRI of the left hip yesterday. There is no obvious evidence of fracture on the MRI. -Appreciate orthopedic input and no plans for surgical intervention. Hemoglobin is stable. Pain control with as needed Norco.  - await PT eval but likely will need SNF/STR  2. Delerium/AMS - etiology unclear presently.  Check urinalysis, chest x-ray, TSH, ammonia. Possibly related to underlying pain medications. Next-follow mental status.  3. Hypokalemia: improved and resolved w/ supplementation.   4. Essential hypertension: Continue Avapro and metoprolol  5. Diabetes: Continue ADA diet and sliding scale insulin - BS stable  6. Depression/Anxiety - cont. Remeron/Xanax  7. GERD - cont. Protonix.   Management plans discussed with the patient and she is in agreement.  CODE STATUS: full  TOTAL TIME TAKING CARE OF THIS PATIENT: 30 minutes.   POSSIBLE D/C 1-2 days, DEPENDING ON CLINICAL CONDITION.   Houston Siren M.D on 10/11/2016 at 3:28 PM  Between 7am to 6pm - Pager - [phone]  After 6pm go to [email or web address] - password [email or web address]  [facility]  Office  [phone]  CC: Primary care physician; Leotis Shames, MD  Note: This dictation was prepared with Dragon dictation along with smaller phrase technology. Any transcriptional errors that result from this process are unintentional.

## 2016-10-11 NOTE — Progress Notes (Signed)
Physical Therapy Treatment Patient Details Name: Carrie Glenn MRN: [id number] DOB: [birth year] Today's Date: 10/11/2016    History of Present Illness Carrie Glenn  is a 76 y.o. female with a known history of Hypertension, diabetes, renal failure and arthritis. The patient fell at home. She complains of generalized weakness. She denies any syncope, loss of consciousness or seizure. She complains of left hip pain and swelling. X-ray didn't show any hip fracture    PT Comments    Carrie Glenn is making very modest progress with mobility and requires max assist for sit<>stand and stand pivot transfers due to fatigue.  Her daughter in law reports that pt is more confused than her baseline and the pt has a very difficult time remaining on task throughout session.  She tolerated all interventions well, including therapeutic exercises.  SNF remains most appropriate d/c plan.  Pt will benefit from continued skilled PT services to increase functional independence and safety.   Follow Up Recommendations  SNF     Equipment Recommendations  Other (comment) (TBD at next venue of care)    Recommendations for Other Services       Precautions / Restrictions Precautions Precautions: Fall;Other (comment) Precaution Comments: Pt blind and very HOH.  Does much better with hearing aids. Restrictions Weight Bearing Restrictions: No Other Position/Activity Restrictions: Spoke with Dr. Martha Clan prior to treatment session today who reported no WB restrictions of LLE    Mobility  Bed Mobility Overal bed mobility: Needs Assistance Bed Mobility: Supine to Sit     Supine to sit: Max assist;HOB elevated     General bed mobility comments: Cues for hand placement and max assist to elevate trunk and to manage LEs.    Transfers Overall transfer level: Needs assistance Equipment used: Rolling Baray (2 wheeled) (HHA sit to stand) Transfers: Sit to/from [employer] Sit to Stand: Max  assist Stand pivot transfers: Max assist;+2 physical assistance       General transfer comment: Max assist to steady and to boost to standing as well as assist to manage RW during pivot with step by step sequencing verbal cues to pivot.  Ambulation/Gait Ambulation/Gait assistance:  (unable, poor standing balance)           General Gait Details: No safe to attempt at this time   Stairs            Wheelchair Mobility    Modified Rankin (Stroke Patients Only)       Balance Overall balance assessment: History of Falls;Needs assistance Sitting-balance support: Single extremity supported;Feet supported Sitting balance-Leahy Scale: Poor Sitting balance - Comments: Pt must have at least 1 UE supported on bed while sitting EOB.     Standing balance support: Bilateral upper extremity supported;During functional activity Standing balance-Leahy Scale: Poor Standing balance comment: Pt relies on RW and outside physical assist to maintain balance in upright                    Cognition Arousal/Alertness: Awake/alert Behavior During Therapy: Anxious Overall Cognitive Status: History of cognitive impairments - at baseline                 General Comments: Pt with h/o cognitive impairments; however, daughter in law reports that pt is more confused now compared to her baseline.  Pt believes she is at home despite being told she is in the hospital.  She repeats her questions and demonstrates poor short term memory.    Exercises General Exercises -  Lower Extremity Long Arc Quad: AAROM;Left;10 reps;Seated Heel Slides: AAROM;Left;10 reps;Seated Hip ABduction/ADduction: AAROM;Left;10 reps;Seated Straight Leg Raises: AAROM;Left;10 reps;Seated    General Comments General comments (skin integrity, edema, etc.): Daughter in law and RN present during session.  RN assists with pericare after pt uses BSC and assists with positioning of BSC and chair as pt pivots and prepares  to sit.      Pertinent Vitals/Pain Pain Assessment: Faces Faces Pain Scale: Hurts a little bit Pain Location: L hip Pain Descriptors / Indicators: Grimacing;Discomfort Pain Intervention(s): Limited activity within patient's tolerance;Monitored during session;Repositioned    Home Living                      Prior Function            PT Goals (current goals can now be found in the care plan section) Acute Rehab PT Goals Patient Stated Goal: none stated PT Goal Formulation: With family Potential to Achieve Goals: Fair Progress towards PT goals: Progressing toward goals (very modestly)    Frequency    7X/week      PT Plan Current plan remains appropriate    Co-evaluation             End of Session Equipment Utilized During Treatment: Gait belt Activity Tolerance: Patient limited by fatigue Patient left: in chair;with call bell/phone within reach;with chair alarm set;with family/visitor present;with nursing/sitter in room     Time: [id number] PT Time Calculation (min) (ACUTE ONLY): 29 min  Charges:  $Therapeutic Activity: 23-37 mins                    G Codes:      Encarnacion ChuAshley Dinora Hemm PT, DPT 10/11/2016, 12:45 PM

## 2016-10-11 NOTE — Progress Notes (Signed)
Subjective:  Patient's daughter-in-law is at the bedside. Patient has developed delirium.  She is unable to provide a history.  The patient's daughter in law explains that she recently finished antibiotics treatment. The delirium may be from narcotic pain medication versus underlying infection. An MRI of the left hip was performed yesterday which shows no evidence of hip fracture.  Objective:   VITALS:   Vitals:   10/10/16 1507 10/10/16 1941 10/11/16 0553 10/11/16 0752  BP: (!) 122/58 (!) 144/70 (!) 170/84 (!) 148/79  Pulse: 71 71 73 76  Resp: 18 16 16 16   Temp: 97.8 F (36.6 C) 97.7 F (36.5 C) 97.7 F (36.5 C) 97.4 F (36.3 C)  TempSrc: Oral Oral Oral Oral  SpO2: 96% 99% 98% 100%  Weight:      Height:        PHYSICAL EXAM:  Left lower extremity: Patient's skin remains intact. Her hematoma over the lateral hip and thigh remained stable. Her thigh compartments are soft and compressible. Patient has palpable pedal pulses. Motor and sensory function could not be tested due to the patient's delirium.   LABS  Results for orders placed or performed during the hospital encounter of 10/09/16 (from the past 24 hour(s))  Glucose, capillary     Status: Abnormal   Collection Time: 10/10/16  4:43 PM  Result Value Ref Range   Glucose-Capillary 141 (H) 65 - 99 mg/dL  Glucose, capillary     Status: Abnormal   Collection Time: 10/10/16  9:17 PM  Result Value Ref Range   Glucose-Capillary 161 (H) 65 - 99 mg/dL   Comment 1 Notify RN   CBC     Status: Abnormal   Collection Time: 10/11/16  3:57 AM  Result Value Ref Range   WBC 7.4 3.6 - 11.0 K/uL   RBC 2.64 (L) 3.80 - 5.20 MIL/uL   Hemoglobin 8.8 (L) 12.0 - 16.0 g/dL   HCT 16.1 (L) 09.6 - 04.5 %   MCV 93.2 80.0 - 100.0 fL   MCH 33.2 26.0 - 34.0 pg   MCHC 35.6 32.0 - 36.0 g/dL   RDW 40.9 81.1 - 91.4 %   Platelets 96 (L) 150 - 440 K/uL  Creatinine, serum     Status: Abnormal   Collection Time: 10/11/16  3:57 AM  Result Value Ref  Range   Creatinine, Ser 1.14 (H) 0.44 - 1.00 mg/dL   GFR calc non Af Amer 45 (L) >60 mL/min   GFR calc Af Amer 52 (L) >60 mL/min  Potassium     Status: None   Collection Time: 10/11/16  3:57 AM  Result Value Ref Range   Potassium 3.6 3.5 - 5.1 mmol/L  Glucose, capillary     Status: Abnormal   Collection Time: 10/11/16  7:51 AM  Result Value Ref Range   Glucose-Capillary 197 (H) 65 - 99 mg/dL  Glucose, capillary     Status: Abnormal   Collection Time: 10/11/16 11:36 AM  Result Value Ref Range   Glucose-Capillary 141 (H) 65 - 99 mg/dL    Dg Chest 1 View  Result Date: 10/09/2016 CLINICAL DATA:  Patient fell about 4 hours ago. EXAM: CHEST 1 VIEW COMPARISON:  04/08/2015 FINDINGS: Lungs are hyperexpanded. The lungs are clear wiithout focal pneumonia, edema, pneumothorax or pleural effusion. Cardiopericardial silhouette is at upper limits of normal for size. Moderate hiatal hernia noted. Bones are diffusely demineralized. Telemetry leads overlie the chest. IMPRESSION: 1. No acute cardiopulmonary findings. 2. Emphysema. 3. Moderate hiatal hernia. Electronically  Signed   By: Kennith CenterEric  Mansell M.D.   On: 10/09/2016 17:07   Ct Head Wo Contrast  Result Date: 10/09/2016 CLINICAL DATA:  Patient fell about 4 hours ago. Left-sided headache. EXAM: CT HEAD WITHOUT CONTRAST CT CERVICAL SPINE WITHOUT CONTRAST TECHNIQUE: Multidetector CT imaging of the head and cervical spine was performed following the standard protocol without intravenous contrast. Multiplanar CT image reconstructions of the cervical spine were also generated. COMPARISON:  Brain MRI 04/07/2015. Head CT and cervical spine CT 04/06/2015. FINDINGS: CT HEAD FINDINGS Brain: There is no evidence for acute hemorrhage, hydrocephalus, mass lesion, or abnormal extra-axial fluid collection. No definite CT evidence for acute infarction. Diffuse loss of parenchymal volume is consistent with atrophy. Patchy low attenuation in the deep hemispheric and  periventricular white matter is nonspecific, but likely reflects chronic microvascular ischemic demyelination. Vascular: Atherosclerotic calcification is visualized in the carotid arteries. No dense MCA sign. Major dural sinuses are unremarkable. Skull: No evidence for fracture. No worrisome lytic or sclerotic lesion. Sinuses/Orbits: The visualized paranasal sinuses and mastoid air cells are clear. Visualized portions of the globes and intraorbital fat are unremarkable. Other: None. CT CERVICAL SPINE FINDINGS Alignment: Straightening of the normal cervical lordosis. No subluxation. Skull base and vertebrae: No evidence of fracture from the skullbase to the T1 vertebral body. No worrisome lytic or sclerotic osseous abnormality. Soft tissues and spinal canal: No prevertebral fluid or swelling. No visible canal hematoma. Disc levels: Patient is status post ACDF with anterior plate at N0-2C5-6. Loss of intervertebral disc height with endplate degeneration noted at C4-5 and C6-7. Facets are well aligned bilaterally. Upper chest: Negative. Other: None. IMPRESSION: 1. No acute intracranial abnormality. 2. Atrophy with chronic small vessel white matter ischemic disease. 3. No cervical spine fracture. 4. Loss of cervical lordosis. This can be related to patient positioning, muscle spasm or soft tissue injury. Electronically Signed   By: Kennith CenterEric  Mansell M.D.   On: 10/09/2016 16:29   Ct Cervical Spine Wo Contrast  Result Date: 10/09/2016 CLINICAL DATA:  Patient fell about 4 hours ago. Left-sided headache. EXAM: CT HEAD WITHOUT CONTRAST CT CERVICAL SPINE WITHOUT CONTRAST TECHNIQUE: Multidetector CT imaging of the head and cervical spine was performed following the standard protocol without intravenous contrast. Multiplanar CT image reconstructions of the cervical spine were also generated. COMPARISON:  Brain MRI 04/07/2015. Head CT and cervical spine CT 04/06/2015. FINDINGS: CT HEAD FINDINGS Brain: There is no evidence for acute  hemorrhage, hydrocephalus, mass lesion, or abnormal extra-axial fluid collection. No definite CT evidence for acute infarction. Diffuse loss of parenchymal volume is consistent with atrophy. Patchy low attenuation in the deep hemispheric and periventricular white matter is nonspecific, but likely reflects chronic microvascular ischemic demyelination. Vascular: Atherosclerotic calcification is visualized in the carotid arteries. No dense MCA sign. Major dural sinuses are unremarkable. Skull: No evidence for fracture. No worrisome lytic or sclerotic lesion. Sinuses/Orbits: The visualized paranasal sinuses and mastoid air cells are clear. Visualized portions of the globes and intraorbital fat are unremarkable. Other: None. CT CERVICAL SPINE FINDINGS Alignment: Straightening of the normal cervical lordosis. No subluxation. Skull base and vertebrae: No evidence of fracture from the skullbase to the T1 vertebral body. No worrisome lytic or sclerotic osseous abnormality. Soft tissues and spinal canal: No prevertebral fluid or swelling. No visible canal hematoma. Disc levels: Patient is status post ACDF with anterior plate at V2-5C5-6. Loss of intervertebral disc height with endplate degeneration noted at C4-5 and C6-7. Facets are well aligned bilaterally. Upper chest: Negative. Other:  None. IMPRESSION: 1. No acute intracranial abnormality. 2. Atrophy with chronic small vessel white matter ischemic disease. 3. No cervical spine fracture. 4. Loss of cervical lordosis. This can be related to patient positioning, muscle spasm or soft tissue injury. Electronically Signed   By: Kennith Center M.D.   On: 10/09/2016 16:29   Mr Hip Left Wo Contrast  Result Date: 10/10/2016 CLINICAL DATA:  Fall today with left hip pain and acute swelling. No fracture on radiography. EXAM: MR OF THE LEFT HIP WITHOUT CONTRAST TECHNIQUE: Multiplanar, multisequence MR imaging was performed. No intravenous contrast was administered. COMPARISON:   10/09/2016 and 01/08/2016 FINDINGS: Despite efforts by the technologist and patient, motion artifact is present on today's exam and could not be eliminated. This reduces exam sensitivity and specificity. Bones: No osseous edema to suggest fracture. Moderate degenerative arthropathy of both hips. Lower lumbar spondylosis and degenerative disc disease. Articular cartilage and labrum Articular cartilage: Chondral thinning in both hips both the axial an craniocaudad. Labrum:  Obscured by motion artifact. Joint or bursal effusion Joint effusion:  Absent Bursae:  Absent Muscles and tendons Muscles and tendons: Low-grade muscular edema in the gluteus medius muscle bilaterally and also deep to both iliacus muscles. There is also asymmetric edema tracking along the left hip adductor musculature suspicious for adductor strain. Edema tracks along the distal left gluteus maximus musculotendinous junction. Other findings Miscellaneous: Extensive subcutaneous edema overlying the left hip with a 8.6 by 4.3 by 13.3 cm (volume = 260 cm^3)complex collection tracking in the subcutaneous tissues along the superficial fascia margin of the iliotibial band and tensor fascia lata. This collection has high T2 and intermediate T1 signal characteristics, and given the acute swelling in this vicinity this is thought represent a hematoma. Please note that hematoma along the superficial fascia margin can be due to shear injury and can sometimes require surgical intervention to prevent reaccumulation (Morel-Lavallee lesion). Small amount of presacral fluid without an appreciable sacral fracture. IMPRESSION: 1. 260 cc complex collection tracking in the subcutaneous tissues along the superficial fascia margin of the left tensor fascia lata and iliotibial band, compatible with hematoma. 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 Considerable surrounding subcutaneous edema noted. 2. Suspected strain of the left hip adductor musculature which  demonstrates asymmetric edema. 3. Fairly symmetric edema involving the gluteus minimus and medius muscles and tracking deep to the iliacus muscles, possibly chronic. 4. Small amount of presacral edema is present, but I do not see a definite sacral fracture. Electronically Signed   By: Gaylyn Rong M.D.   On: 10/10/2016 15:30   Dg Hip Unilat With Pelvis 2-3 Views Left  Result Date: 10/09/2016 CLINICAL DATA:  Witnessed fall 4 hours ago, LEFT hip pain and bruising EXAM: DG HIP (WITH OR WITHOUT PELVIS) 2-3V LEFT COMPARISON:  None ; correlation CT abdomen and pelvis 01/08/2016 FINDINGS: Diffuse osseous demineralization. Degenerative changes of both hips. SI joint spaces grossly preserved. Significant lateral soft tissue swelling at hip/pelvic region. No definite acute fracture, dislocation, bone destruction. IMPRESSION: Osseous demineralization with mild degenerative changes of both hip joints. Lateral soft tissue swelling at LEFT lateral pelvis and hip without definite acute bony abnormalities. Electronically Signed   By: Ulyses Southward M.D.   On: 10/09/2016 17:08    Assessment/Plan:     Active Problems:   Hypokalemia  Patient has a left thigh hematoma. There is no evidence of fracture.  Spoke with the physical therapist this morning. We have reinitiated therapy for the patient. She may be weightbearing as  tolerated on the left lower extremity based on her MRI results. She will need a skilled nursing facility upon discharge.   Juanell Fairly , MD 10/11/2016, 2:24 PM

## 2016-10-12 ENCOUNTER — Observation Stay: Payer: Medicare Other

## 2016-10-12 ENCOUNTER — Inpatient Hospital Stay: Payer: Medicare Other

## 2016-10-12 DIAGNOSIS — I10 Essential (primary) hypertension: Secondary | ICD-10-CM | POA: Diagnosis present

## 2016-10-12 DIAGNOSIS — M810 Age-related osteoporosis without current pathological fracture: Secondary | ICD-10-CM | POA: Diagnosis present

## 2016-10-12 DIAGNOSIS — Y92009 Unspecified place in unspecified non-institutional (private) residence as the place of occurrence of the external cause: Secondary | ICD-10-CM | POA: Diagnosis not present

## 2016-10-12 DIAGNOSIS — Z8701 Personal history of pneumonia (recurrent): Secondary | ICD-10-CM | POA: Diagnosis not present

## 2016-10-12 DIAGNOSIS — G8929 Other chronic pain: Secondary | ICD-10-CM | POA: Diagnosis present

## 2016-10-12 DIAGNOSIS — H548 Legal blindness, as defined in USA: Secondary | ICD-10-CM | POA: Diagnosis present

## 2016-10-12 DIAGNOSIS — G9340 Encephalopathy, unspecified: Secondary | ICD-10-CM | POA: Diagnosis not present

## 2016-10-12 DIAGNOSIS — Z823 Family history of stroke: Secondary | ICD-10-CM | POA: Diagnosis not present

## 2016-10-12 DIAGNOSIS — M25552 Pain in left hip: Secondary | ICD-10-CM | POA: Diagnosis present

## 2016-10-12 DIAGNOSIS — E876 Hypokalemia: Secondary | ICD-10-CM | POA: Diagnosis present

## 2016-10-12 DIAGNOSIS — K219 Gastro-esophageal reflux disease without esophagitis: Secondary | ICD-10-CM | POA: Diagnosis present

## 2016-10-12 DIAGNOSIS — D638 Anemia in other chronic diseases classified elsewhere: Secondary | ICD-10-CM | POA: Diagnosis present

## 2016-10-12 DIAGNOSIS — D62 Acute posthemorrhagic anemia: Secondary | ICD-10-CM | POA: Diagnosis present

## 2016-10-12 DIAGNOSIS — H9190 Unspecified hearing loss, unspecified ear: Secondary | ICD-10-CM | POA: Diagnosis present

## 2016-10-12 DIAGNOSIS — F0390 Unspecified dementia without behavioral disturbance: Secondary | ICD-10-CM | POA: Diagnosis present

## 2016-10-12 DIAGNOSIS — F419 Anxiety disorder, unspecified: Secondary | ICD-10-CM | POA: Diagnosis present

## 2016-10-12 DIAGNOSIS — W01198A Fall on same level from slipping, tripping and stumbling with subsequent striking against other object, initial encounter: Secondary | ICD-10-CM | POA: Diagnosis present

## 2016-10-12 DIAGNOSIS — F40240 Claustrophobia: Secondary | ICD-10-CM | POA: Diagnosis present

## 2016-10-12 DIAGNOSIS — F329 Major depressive disorder, single episode, unspecified: Secondary | ICD-10-CM | POA: Diagnosis present

## 2016-10-12 DIAGNOSIS — M21372 Foot drop, left foot: Secondary | ICD-10-CM | POA: Diagnosis present

## 2016-10-12 DIAGNOSIS — E119 Type 2 diabetes mellitus without complications: Secondary | ICD-10-CM | POA: Diagnosis present

## 2016-10-12 DIAGNOSIS — M549 Dorsalgia, unspecified: Secondary | ICD-10-CM | POA: Diagnosis present

## 2016-10-12 DIAGNOSIS — Z825 Family history of asthma and other chronic lower respiratory diseases: Secondary | ICD-10-CM | POA: Diagnosis not present

## 2016-10-12 DIAGNOSIS — S7002XA Contusion of left hip, initial encounter: Secondary | ICD-10-CM | POA: Diagnosis present

## 2016-10-12 DIAGNOSIS — Z833 Family history of diabetes mellitus: Secondary | ICD-10-CM | POA: Diagnosis not present

## 2016-10-12 DIAGNOSIS — H3552 Pigmentary retinal dystrophy: Secondary | ICD-10-CM | POA: Diagnosis present

## 2016-10-12 DIAGNOSIS — Z9181 History of falling: Secondary | ICD-10-CM | POA: Diagnosis not present

## 2016-10-12 LAB — CBC
HCT: 23.7 % — ABNORMAL LOW (ref 35.0–47.0)
HEMOGLOBIN: 8.2 g/dL — AB (ref 12.0–16.0)
MCH: 32.8 pg (ref 26.0–34.0)
MCHC: 34.7 g/dL (ref 32.0–36.0)
MCV: 94.4 fL (ref 80.0–100.0)
Platelets: 114 10*3/uL — ABNORMAL LOW (ref 150–440)
RBC: 2.51 MIL/uL — AB (ref 3.80–5.20)
RDW: 13.8 % (ref 11.5–14.5)
WBC: 6.2 10*3/uL (ref 3.6–11.0)

## 2016-10-12 LAB — BASIC METABOLIC PANEL WITH GFR
Anion gap: 5 (ref 5–15)
BUN: 15 mg/dL (ref 6–20)
CO2: 29 mmol/L (ref 22–32)
Calcium: 8.6 mg/dL — ABNORMAL LOW (ref 8.9–10.3)
Chloride: 104 mmol/L (ref 101–111)
Creatinine, Ser: 1.07 mg/dL — ABNORMAL HIGH (ref 0.44–1.00)
GFR calc Af Amer: 57 mL/min — ABNORMAL LOW
GFR calc non Af Amer: 49 mL/min — ABNORMAL LOW
Glucose, Bld: 164 mg/dL — ABNORMAL HIGH (ref 65–99)
Potassium: 3.3 mmol/L — ABNORMAL LOW (ref 3.5–5.1)
Sodium: 138 mmol/L (ref 135–145)

## 2016-10-12 LAB — GLUCOSE, CAPILLARY
GLUCOSE-CAPILLARY: 279 mg/dL — AB (ref 65–99)
GLUCOSE-CAPILLARY: 155 mg/dL — AB (ref 65–99)
GLUCOSE-CAPILLARY: 146 mg/dL — AB (ref 65–99)
Glucose-Capillary: 149 mg/dL — ABNORMAL HIGH (ref 65–99)

## 2016-10-12 MED ORDER — POTASSIUM CHLORIDE CRYS ER 20 MEQ PO TBCR
20.0000 meq | EXTENDED_RELEASE_TABLET | Freq: Two times a day (BID) | ORAL | Status: DC
  Administered 2016-10-12 – 2016-10-15 (×6): 20 meq via ORAL
  Filled 2016-10-12 (×7): qty 1

## 2016-10-12 NOTE — Clinical Social Work Placement (Signed)
   CLINICAL SOCIAL WORK PLACEMENT  NOTE  Date:  10/12/2016  Patient Details  Name: Corliss Marcusrma J Fano MRN: [id number] Date of Birth: 12/09/1940  Clinical Social Work is seeking post-discharge placement for this patient at the Skilled  Nursing Facility level of care (*CSW will initial, date and re-position this form in  chart as items are completed):  Yes   Patient/family provided with [place] Clinical Social Work Department's list of facilities offering this level of care within the geographic area requested by the patient (or if unable, by the patient's family).  Yes   Patient/family informed of their freedom to choose among providers that offer the needed level of care, that participate in Medicare, Medicaid or managed care program needed by the patient, have an available bed and are willing to accept the patient.  Yes   Patient/family informed of [facility]'s ownership interest in [facility] and [facility], as well as of the fact that they are under no obligation to receive care at these facilities.  PASRR submitted to EDS on       PASRR number received on       Existing PASRR number confirmed on 10/12/16     FL2 transmitted to all facilities in geographic area requested by pt/family on 10/12/16     FL2 transmitted to all facilities within larger geographic area on       Patient informed that his/her managed care company has contracts with or will negotiate with certain facilities, including the following:            Patient/family informed of bed offers received.  Patient chooses bed at       Physician recommends and patient chooses bed at      Patient to be transferred to   on  .  Patient to be transferred to facility by       Patient family notified on   of transfer.  Name of family member notified:        PHYSICIAN       Additional Comment:    _______________________________________________ Verner Kopischke, Darleen CrockerBailey M, LCSW 10/12/2016, 2:46 PM

## 2016-10-12 NOTE — [place] FL2 (Signed)
[address] [place] [address] MEDICA[place] FL2 LEVEL OF CARE SCREENING TOOL     IDENTIFICATION  Patient Name: Carrie Glenn Birthdate: 02/08/1941 Sex: female Admission Date (Current Location): 10/09/2016  [place]   Facility and Address:  [facility], [address], [place], [place] [address]      Provider Number: [id number]  Attending Physician Name and Address:  Houston SirenVivek J Sainani, MD  Relative Name and Phone Number:       Current Level of Care: Hospital Recommended Level of Care: Skilled Nursing Facility Prior Approval Number:    Date Approved/Denied:   PASRR Number:  [id number] )  Discharge Plan: SNF    Current Diagnoses: Patient Active Problem List   Diagnosis Date Noted  . Gastro-esophageal reflux disease without esophagitis 09/19/2015  . Back pain, thoracic 09/19/2015  . Degeneration of intervertebral disc of lumbar region 09/04/2015  . Mixed incontinence 06/15/2015  . Anxiety 06/10/2015  . Controlled type 2 diabetes mellitus without complication (HCC) 06/10/2015  . HCAP (healthcare-associated pneumonia) 04/09/2015  . Acute encephalopathy 04/09/2015  . Frequent falls 04/09/2015  . Hypokalemia 04/09/2015  . HTN (hypertension) 04/07/2015  . Diabetes (HCC) 04/07/2015  . Acute renal failure (HCC) 03/09/2015  . Sepsis (HCC) 03/08/2015  . Acute UTI 03/08/2015  . Hypotension 03/08/2015  . Weakness generalized 03/08/2015  . Chronic back pain 03/08/2015  . 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 Type 2 diabetes mellitus (HCC) 11/28/2014  . Primary osteoarthritis of both knees 10/15/2014  . Pain in shoulder 08/15/2014  . 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 Degenerative arthritis of lumbar spine 08/15/2014  . Awareness of heartbeats 07/10/2014  . Difficult or painful urination 05/23/2014  . Excessive urination at night 01/31/2014    Orientation RESPIRATION BLADDER Height & Weight     Self  Normal Continent Weight: 118 lb 6.4 oz (53.7 kg) Height:  5\' 3"  (160 cm)  BEHAVIORAL SYMPTOMS/MOOD NEUROLOGICAL BOWEL NUTRITION STATUS   (none)  (none) Continent Diet (Diet: Heart Healthy/ Carb Modified )  AMBULATORY STATUS COMMUNICATION OF NEEDS Skin   Extensive Assist Verbally Normal                       Personal Care Assistance Level of Assistance  Bathing, Feeding, Dressing Bathing Assistance: Limited assistance Feeding assistance: Independent Dressing Assistance: Limited assistance     Functional Limitations Info  Sight, Hearing, Speech Sight Info: Impaired Hearing Info: Impaired Speech Info: Adequate    SPECIAL CARE FACTORS FREQUENCY  PT (By licensed PT), OT (By licensed OT)     PT Frequency:  (5) OT Frequency:  (5)            Contractures Contractures Info: Present    Additional Factors Info  Code Status, Allergies, Insulin Sliding Scale Code Status Info:  (Full Code. ) Allergies Info:  (Amlodipine, Erythromycin, Lexapro Escitalopram Oxalate, Lipitor Atorvastatin, Macrodantin Nitrofurantoin Macrocrystal, Nitrofurantoin, Sertraline Hcl, Trazodone And Nefazodone, Amoxicillin, Azithromycin, Effexor Venlafaxine, Metformin And Related. )   Insulin Sliding Scale Info:  (NovoLog Insulin Injections )       Current Medications (10/12/2016):  This is the current hospital active medication list Current Facility-Administered Medications  Medication Dose Route Frequency Provider Last Rate Last Dose  . 0.9 %  sodium chloride infusion  250 mL Intravenous PRN Shaune PollackQing Chen, MD      . acetaminophen (TYLENOL) tablet 650 mg  650 mg Oral Q6H PRN Shaune PollackQing Chen, MD   650 mg  at 10/10/16 1610   Or  . acetaminophen (TYLENOL) suppository 650 mg  650 mg Rectal Q6H PRN Shaune Pollack, MD      . albuterol (PROVENTIL) (2.5 MG/3ML) 0.083% nebulizer solution 2.5 mg  2.5 mg Nebulization Q2H PRN Shaune Pollack, MD      . ALPRAZolam (XANAX XR) 24 hr tablet 0.5 mg  0.5 mg Oral Daily Shaune Pollack, MD   0.5 mg  at 10/12/16 1047  . aspirin EC tablet 81 mg  81 mg Oral QPC supper Shaune Pollack, MD   81 mg at 10/11/16 1732  . enoxaparin (LOVENOX) injection 40 mg  40 mg Subcutaneous Q24H Shaune Pollack, MD   40 mg at 10/11/16 2218  . gabapentin (NEURONTIN) capsule 300 mg  300 mg Oral QPC supper Shaune Pollack, MD   300 mg at 10/11/16 1732  . HYDROcodone-acetaminophen (NORCO) 7.5-325 MG per tablet 1 tablet  1 tablet Oral TID PRN Shaune Pollack, MD   1 tablet at 10/11/16 0456  . insulin aspart (novoLOG) injection 0-5 Units  0-5 Units Subcutaneous QHS Shaune Pollack, MD   2 Units at 10/09/16 2223  . insulin aspart (novoLOG) injection 0-9 Units  0-9 Units Subcutaneous TID WC Shaune Pollack, MD   5 Units at 10/12/16 1242  . irbesartan (AVAPRO) tablet 300 mg  300 mg Oral Daily Shaune Pollack, MD   300 mg at 10/12/16 1048  . metoprolol tartrate (LOPRESSOR) tablet 75 mg  75 mg Oral BID Shaune Pollack, MD   75 mg at 10/12/16 1052  . mirtazapine (REMERON) tablet 7.5 mg  7.5 mg Oral QPC supper Shaune Pollack, MD   7.5 mg at 10/11/16 1733  . multivitamin with minerals tablet 1 tablet  1 tablet Oral Daily Shaune Pollack, MD   1 tablet at 10/12/16 1047  . ondansetron (ZOFRAN) tablet 4 mg  4 mg Oral Q6H PRN Shaune Pollack, MD       Or  . ondansetron [facility]) injection 4 mg  4 mg Intravenous Q6H PRN Shaune Pollack, MD      . pantoprazole (PROTONIX) EC tablet 40 mg  40 mg Oral BID Shaune Pollack, MD   40 mg at 10/12/16 1048  . sodium chloride flush (NS) 0.9 % injection 3 mL  3 mL Intravenous Q12H Shaune Pollack, MD   3 mL at 10/12/16 1056  . sodium chloride flush (NS) 0.9 % injection 3 mL  3 mL Intravenous PRN Shaune Pollack, MD         Discharge Medications: Please see discharge summary for a list of discharge medications.  Relevant Imaging Results:  Relevant Lab Results:   Additional Information  (Allergies Continued: Penicillins. SSN: [id number])  Tadeo Besecker, Darleen Crocker, LCSW

## 2016-10-12 NOTE — Consult Note (Signed)
Reason for Consult:Altered mental status Referring Physician: Verdell Carmine  CC: Altered mental status  HPI: Carrie Glenn is an 76 y.o. female with multiple medical problems who had a fall at home.  Patient lives with daughter.  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  Requires assistance to dress, bathe and keep track of her financial affairs.  Unclear how much of this is due to her vision.  Patient was on Remeron at home, but daughter reports she was cognitively sharp.  Was clear after her fall but developed a left hip hematoma and was brought in for evaluation.  Starting with the first night of hospitalization the patient became altered and has remained so.  Daughter is also concerned that she holds her mouth open.      Past Medical History:  Diagnosis Date  . Acute encephalopathy 04/09/2015  . Acute renal failure ([place]) 03/09/2015  . Acute UTI 03/08/2015  . Arthritis   . Awareness of heartbeats 07/10/2014  . Blind    Legally  . Chronic back pain 03/08/2015  . Controlled type 2 diabetes mellitus without complication ([place]) 6/37/8588  . Degeneration of intervertebral disc of lumbar region 09/04/2015  . Degenerative arthritis of lumbar spine 08/15/2014  . Diabetes ([place]) 04/07/2015  . Diabetes mellitus without complication ([place])   . Difficult or painful urination 05/23/2014  . Excessive urination at night 01/31/2014  . Frequent falls 04/09/2015  . Gastro-esophageal reflux disease without esophagitis 09/19/2015  . Hard of hearing   . HCAP (healthcare-associated pneumonia) 04/09/2015  . HTN (hypertension) 04/07/2015  . Hypertension   . Hypotension 03/08/2015  . Osteoporosis   . Pain    Chronic back  . Retinitis pigmentosa of both eyes   . Type 2 diabetes mellitus ([place]) 11/28/2014    Past Surgical History:  Procedure Laterality Date  . HIATAL HERNIA REPAIR    . left foot surgery  Left    Hammer toe fixation-2nd toe  . 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 Stroke Mother   . Diabetes Mother   . Emphysema Father   . Kidney disease Neg Hx   . Bladder Cancer Neg Hx     Social History:  reports that she has never smoked. She has never used smokeless tobacco. She reports that she does not drink alcohol. Her drug history is not on file.  Allergies  Allergen Reactions  . Amlodipine Swelling  . Erythromycin Nausea Only  . Lexapro [Escitalopram Oxalate] Nausea Only  . Lipitor [Atorvastatin] Nausea And Vomiting  . Macrodantin [Nitrofurantoin Macrocrystal] Nausea Only  . Nitrofurantoin Nausea Only  . Sertraline Hcl Other (See Comments)    Patients gets jittery.  . Trazodone And Nefazodone Other (See Comments)    Patient gets insomnia.  . Amoxicillin Diarrhea, Nausea And Vomiting, Nausea Only and Rash  . Azithromycin Rash  . Effexor [Venlafaxine] Rash  . Metformin And Related Itching and Rash  . Penicillins Rash    Medications:  I have reviewed the patient's current medications. Prior to Admission:  Prescriptions Prior to Admission  Medication Sig Dispense Refill Last Dose  . ALPRAZolam (XANAX XR) 0.5 MG 24 hr tablet Take 1 tablet (0.5 mg total) by mouth daily. 30 tablet 0 10/09/2016 at 0900  . aspirin EC 81 MG tablet Take 81 mg by mouth  daily after supper.    10/09/2016 at 0900  . gabapentin (NEURONTIN) 300 MG capsule Take 300 mg by mouth daily after supper.    10/08/2016 at Unknown time  . HYDROcodone-acetaminophen (NORCO) 7.5-325 MG per tablet Take 1 tablet by mouth 3 (three) times daily as needed for moderate pain or severe pain. 30 tablet 0 10/09/2016 at 0900  . metoprolol (LOPRESSOR) 50 MG tablet Take 75 mg by mouth 2 (two) times daily.   10/09/2016 at 0900  . mirtazapine (REMERON) 7.5 MG tablet Take 7.5 mg by mouth daily after supper.    10/08/2016 at Unknown time  . olmesartan (BENICAR) 20 MG tablet Take 40  mg by mouth daily.    10/09/2016 at 0900  . omeprazole (PRILOSEC OTC) 20 MG tablet Take 20 mg by mouth 2 (two) times daily.    10/09/2016 at 0900  . sitaGLIPtin (JANUVIA) 50 MG tablet Take 50 mg by mouth daily after supper.    10/09/2016 at Unknown time  . acetaminophen (TYLENOL) 500 MG tablet Take 1,000 mg by mouth every 6 (six) hours as needed (pain). Reported on 01/05/2016   prn at prn  . Blood Glucose Monitoring Suppl (ACCU-CHEK COMPACT CARE KIT) KIT 1 kit by Other route See admin instructions.   Taking  . glucose blood test strip 1 each 2 (two) times daily.   Taking  . Multiple Vitamin (MULTIVITAMIN WITH MINERALS) TABS tablet Take 1 tablet by mouth daily. Centrum Silver   Taking   Scheduled: . ALPRAZolam  0.5 mg Oral Daily  . aspirin EC  81 mg Oral QPC supper  . enoxaparin (LOVENOX) injection  40 mg Subcutaneous Q24H  . gabapentin  300 mg Oral QPC supper  . insulin aspart  0-5 Units Subcutaneous QHS  . insulin aspart  0-9 Units Subcutaneous TID WC  . irbesartan  300 mg Oral Daily  . metoprolol  75 mg Oral BID  . mirtazapine  7.5 mg Oral QPC supper  . multivitamin with minerals  1 tablet Oral Daily  . pantoprazole  40 mg Oral BID  . sodium chloride flush  3 mL Intravenous Q12H    ROS: History obtained from the patient  General ROS: negative for - chills, fatigue, fever, night sweats, weight gain or weight loss Psychological ROS: negative for - behavioral disorder, hallucinations, memory difficulties, mood swings or suicidal ideation Ophthalmic ROS: negative for - blurry vision, double vision, eye pain or loss of vision ENT ROS: negative for - epistaxis, nasal discharge, oral lesions, sore throat, tinnitus or vertigo Allergy and Immunology ROS: negative for - hives or itchy/watery eyes Hematological and Lymphatic ROS: negative for - bleeding problems, bruising or swollen lymph nodes Endocrine ROS: negative for - galactorrhea, hair pattern changes, polydipsia/polyuria or temperature  intolerance Respiratory ROS: negative for - cough, hemoptysis, shortness of breath or wheezing Cardiovascular ROS: negative for - chest pain, dyspnea on exertion, edema or irregular heartbeat Gastrointestinal ROS: negative for - abdominal pain, diarrhea, hematemesis, nausea/vomiting or stool incontinence Genito-Urinary ROS: negative for - dysuria, hematuria, incontinence or urinary frequency/urgency Musculoskeletal ROS: left hip pain Neurological ROS: as noted in HPI Dermatological ROS: negative for rash and skin lesion changes  Physical Examination: Blood pressure (!) 103/47, pulse 72, temperature 98.1 F (36.7 C), temperature source Axillary, resp. rate 16, height 5' 3"  (1.6 m), weight 53.7 kg (118 lb 6.4 oz), SpO2 97 %.  HEENT-  Normocephalic, no lesions, without obvious abnormality.  Normal external eye and conjunctiva.  Normal TM's bilaterally.  Normal  auditory canals and external ears. Normal external nose, mucus membranes and septum.  Normal pharynx. Cardiovascular- S1, S2 normal, pulses palpable throughout   Lungs- chest clear, no wheezing, rales, normal symmetric air entry Abdomen- soft, non-tender; bowel sounds normal; no masses,  no organomegaly Extremities- left hip hematoma Lymph-no adenopathy palpable Musculoskeletal-left hp pain Skin-petechia on lower legs  Neurological Examination Mental Status: Alert, oriented to name and place.  Reports that it is 75 but does know that it is January.  Speech fluent but markedly dysarthric.  Able to follow commands without difficulty. Cranial Nerves: II: Patient legally blind but, pupils equal, round, reactive to light and accommodation III,IV, VI: ptosis not present, extra-ocular motions intact bilaterally V,VII: decrease in right NLF, facial light touch sensation normal bilaterally VIII: hearing aids in place IX,X: gag reflex present XI: bilateral shoulder shrug XII: midline tongue extension Motor: Generalized 5-/5 strength  throughout with decreased effort in lifting the LLE due to pain.   Sensory: Pinprick and light touch intact throughout, bilaterally Deep Tendon Reflexes: 1+ in the upper extremities and absent in the lower extremities. Plantars: Right: mute   Left: mute Cerebellar: Normal finger-to-nose testing bilaterally.  Unable to perform heel-to-shin due to pain Gait: not tested due to safety concerns      Laboratory Studies:   Basic Metabolic Panel:  Recent Labs Lab 10/09/16 1548 10/10/16 0348 10/11/16 0357 10/12/16 1012  NA 142 142  --  138  K 2.5* 3.1* 3.6 3.3*  CL 102 105  --  104  CO2 30 31  --  29  GLUCOSE 241* 123*  --  164*  BUN 13 16  --  15  CREATININE 1.10* 1.16* 1.14* 1.07*  CALCIUM 8.4* 7.8*  --  8.6*  MG 1.5*  --   --   --     Liver Function Tests:  Recent Labs Lab 10/09/16 1548  AST 26  ALT 6*  ALKPHOS 48  BILITOT 0.3  PROT 5.9*  ALBUMIN 3.6   No results for input(s): LIPASE, AMYLASE in the last 168 hours.  Recent Labs Lab 10/11/16 1532  AMMONIA <9*    CBC:  Recent Labs Lab 10/09/16 1548 10/10/16 0348 10/11/16 0357 10/12/16 1012  WBC 10.2 9.3 7.4 6.2  NEUTROABS 5.6  --   --   --   HGB 11.6* 8.6* 8.8* 8.2*  HCT 33.5* 24.5* 24.6* 23.7*  MCV 95.0 92.8 93.2 94.4  PLT 135* 92* 96* 114*    Cardiac Enzymes:  Recent Labs Lab 10/09/16 1548  CKTOTAL 45    BNP: Invalid input(s): POCBNP  CBG:  Recent Labs Lab 10/11/16 1136 10/11/16 1607 10/11/16 2158 10/12/16 0829 10/12/16 1123  GLUCAP 141* 179* 158* 149* 15*    Microbiology: Results for orders placed or performed in visit on 01/05/16  Microscopic Examination     Status: Abnormal   Collection Time: 01/05/16 10:49 AM  Result Value Ref Range Status   WBC, UA 6-10 (A) 0 - 5 /hpf Final   RBC, UA 0-2 0 - 2 /hpf Final   Epithelial Cells (non renal) 0-10 0 - 10 /hpf Final   Bacteria, UA None seen None seen/Few Final    Coagulation Studies:  Recent Labs  10/09/16 1548  LABPROT  13.6  INR 1.04    Urinalysis:  Recent Labs Lab 10/11/16 1715  COLORURINE YELLOW*  LABSPEC 1.013  PHURINE 7.0  GLUCOSEU NEGATIVE  HGBUR NEGATIVE  BILIRUBINUR NEGATIVE  KETONESUR NEGATIVE  PROTEINUR NEGATIVE  NITRITE NEGATIVE  LEUKOCYTESUR  NEGATIVE    Lipid Panel:  No results found for: CHOL, TRIG, HDL, CHOLHDL, VLDL, LDLCALC  HgbA1C: No results found for: HGBA1C  Urine Drug Screen:  No results found for: LABOPIA, COCAINSCRNUR, LABBENZ, AMPHETMU, THCU, LABBARB  Alcohol Level: No results for input(s): ETH in the last 168 hours.   Imaging: Dg Chest 1 View  Result Date: 10/11/2016 CLINICAL DATA:  Fall and generalize weakness. EXAM: CHEST 1 VIEW COMPARISON:  None. FINDINGS: The heart size and mediastinal contours are within normal limits. There is no evidence of pulmonary edema, consolidation, pneumothorax, nodule or pleural fluid. Stable large hiatal hernia. The visualized skeletal structures are unremarkable. IMPRESSION: No active disease. Electronically Signed   By: Aletta Edouard M.D.   On: 10/11/2016 16:59   Ct Head Wo Contrast  Result Date: 10/12/2016 CLINICAL DATA:  Altered mental status.  Recent fall. EXAM: CT HEAD WITHOUT CONTRAST TECHNIQUE: Contiguous axial images were obtained from the base of the skull through the vertex without intravenous contrast. COMPARISON:  October 09, 2016 FINDINGS: Brain: Mild diffuse atrophy is stable. There is no intracranial mass, hemorrhage, extra-axial fluid collection, or midline shift. Mild patchy small vessel disease in the centra semiovale bilaterally is stable. There is no new gray-white compartment lesion. No acute infarct evident. Vascular: There is no hyperdense vessel. There is calcification in each carotid siphon region. Skull: The bony calvarium appears intact. Sinuses/Orbits: Visualized paranasal sinuses are clear. Orbits appear symmetric bilaterally. Other: There is opacification of multiple mastoid air cells on the right. Mastoids  on the left are clear. IMPRESSION: Stable mild atrophy with periventricular small vessel disease. No new gray-white compartment lesion. No intracranial mass, hemorrhage, or extra-axial fluid collection. There is mastoid air cell disease on the right. Mastoids on the left are clear. Areas of arterial vascular calcification noted. Electronically Signed   By: Lowella Grip III M.D.   On: 10/12/2016 10:19   Mr Hip Left Wo Contrast  Result Date: 10/10/2016 CLINICAL DATA:  Fall today with left hip pain and acute swelling. No fracture on radiography. EXAM: MR OF THE LEFT HIP WITHOUT CONTRAST TECHNIQUE: Multiplanar, multisequence MR imaging was performed. No intravenous contrast was administered. COMPARISON:  10/09/2016 and 01/08/2016 FINDINGS: Despite efforts by the technologist and patient, motion artifact is present on today's exam and could not be eliminated. This reduces exam sensitivity and specificity. Bones: No osseous edema to suggest fracture. Moderate degenerative arthropathy of both hips. Lower lumbar spondylosis and degenerative disc disease. Articular cartilage and labrum Articular cartilage: Chondral thinning in both hips both the axial an craniocaudad. Labrum:  Obscured by motion artifact. Joint or bursal effusion Joint effusion:  Absent Bursae:  Absent Muscles and tendons Muscles and tendons: Low-grade muscular edema in the gluteus medius muscle bilaterally and also deep to both iliacus muscles. There is also asymmetric edema tracking along the left hip adductor musculature suspicious for adductor strain. Edema tracks along the distal left gluteus maximus musculotendinous junction. Other findings Miscellaneous: Extensive subcutaneous edema overlying the left hip with a 8.6 by 4.3 by 13.3 cm (volume = 260 cm^3)complex collection tracking in the subcutaneous tissues along the superficial fascia margin of the iliotibial band and tensor fascia lata. This collection has high T2 and intermediate T1 signal  characteristics, and given the acute swelling in this vicinity this is thought represent a hematoma. Please note that hematoma along the superficial fascia margin can be due to shear injury and can sometimes require surgical intervention to prevent reaccumulation (Morel-Lavallee lesion). Small amount of presacral  fluid without an appreciable sacral fracture. IMPRESSION: 1. 260 cc complex collection tracking in the subcutaneous tissues along the superficial fascia margin of the left tensor fascia lata and iliotibial band, compatible with hematoma. 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 Considerable surrounding subcutaneous edema noted. 2. Suspected strain of the left hip adductor musculature which demonstrates asymmetric edema. 3. Fairly symmetric edema involving the gluteus minimus and medius muscles and tracking deep to the iliacus muscles, possibly chronic. 4. Small amount of presacral edema is present, but I do not see a definite sacral fracture. Electronically Signed   By: Van Clines M.D.   On: 10/10/2016 15:30     Assessment/Plan: 76 year old female presenting after a fall that has developed altered mental status.  Head CT reviewed and shows no acute changes.  No significant focality noted on neurological examination.  Although mental status may very well be related to pain medications and being hospitalized, will rule out other possibilities as well.  No evidence of infection or significant metabolic abnormalities.    Recommendations: 1.  EEG 2.  MRI of the brain without contrast  Alexis Goodell, MD Neurology [phone] 10/12/2016, 2:53 PM

## 2016-10-12 NOTE — Clinical Social Work Note (Signed)
Clinical Social Work Assessment  Patient Details  Name: Carrie Glenn MRN: [id number] Date of Birth: [birth year]  Date of referral:  10/12/16               Reason for consult:  Facility Placement                Permission sought to share information with:  Chartered certified accountant granted to share information::  Yes, Verbal Permission Granted  Name::      [place]::   [place]   Relationship::     Contact Information:     Housing/Transportation Living arrangements for the past 2 months:  [place] of Information:  Adult Children Patient Interpreter Needed:  None Criminal Activity/Legal Involvement Pertinent to Current Situation/Hospitalization:  No - Comment as needed Significant Relationships:  Adult Children Lives with:  Adult Children Do you feel safe going back to the place where you live?    Need for family participation in patient care:  Yes (Comment)  Care giving concerns:  Patient lives with her daughter Carrie Glenn [phone] in [place].    Social Worker assessment / plan:  Holiday representative ([place]) received verbal consult from RN case manager that patient has changed to inpatient status today 10/12/16. PT is recommending SNF. CSW met with patient and her daughter Carrie Glenn was at bedside. Patient did not participate in assessment. CSW introduced self and explained role of CSW department. Per daughter patient lives with her in [place]. Daughter requested for patient to go to SNF for short term rehab. CSW explained that patient will require a 3 night qualifying inpatient stay at [facility] in order for Medicare to pay for SNF. Patient was admitted to inpatient on 10/12/16. Daughter verbalized her understanding and is agreeable to SNF search in [facility] and prefers [facility]. FL2 complete and faxed out. CSW will continue to follow and assist as needed.   Employment status:  Retired Forensic scientist:  [employer] PT  Recommendations:  [place] / Referral to community resources:  [place]  Patient/Family's Response to care:  Patient's daughter is agreeable to [facility] in [place].   Patient/Family's Understanding of and Emotional Response to Diagnosis, Current Treatment, and Prognosis:  Patient's daughter was very pleasant and thanked CSW for assistance.   Emotional Assessment Appearance:  Appears stated age Attitude/Demeanor/Rapport:  Unable to Assess Affect (typically observed):  Unable to Assess Orientation:  Oriented to Self, Fluctuating Orientation (Suspected and/or reported Sundowners) Alcohol / Substance use:  Not Applicable Psych involvement (Current and /or in the community):  No (Comment)  Discharge Needs  Concerns to be addressed:  Discharge Planning Concerns Readmission within the last 30 days:  No Current discharge risk:  Dependent with Mobility, Cognitively Impaired Barriers to Discharge:  Continued Medical Work up   [employer], Veronia Beets, LCSW 10/12/2016, 2:47 PM

## 2016-10-12 NOTE — Progress Notes (Signed)
Physical Therapy Treatment Patient Details Name: Carrie Glenn MRN: [id number] DOB: [birth year] Today's Date: 10/12/2016    History of Present Illness Carrie Glenn  is a 76 y.o. female with a known history of Hypertension, diabetes, renal failure and arthritis. The patient fell at home. She complains of generalized weakness. She denies any syncope, loss of consciousness or seizure. She complains of left hip pain and swelling. X-ray didn't show any hip fracture    PT Comments    Pt in bed, asleep but awoke easily.  Session focused on bed mobility and transfer training.  Mod/max a x 1 for all bed mobility.  Transferred bed to chair x 4 with mod a +2.  Pt with very narrow BOS with short shuffling steps.  She required assist at times to move LLE and has post LOB requiring assist to maintain upright.    Pt did somewhat better today following commands and with communication but remains +2 assist.  Remains unable to ambulate any functional distances at this time.   Follow Up Recommendations  SNF     Equipment Recommendations       Recommendations for Other Services       Precautions / Restrictions Precautions Precautions: Fall;Other (comment) Precaution Comments: Pt blind and very HOH.  Does much better with hearing aids. Restrictions Weight Bearing Restrictions: No Other Position/Activity Restrictions:  no WB restrictions of LLE    Mobility  Bed Mobility Overal bed mobility: Needs Assistance Bed Mobility: Supine to Sit;Sit to Supine     Supine to sit: Mod assist Sit to supine: Max assist      Transfers Overall transfer level: Needs assistance Equipment used: Rolling [place] (2 wheeled) Transfers: Sit to/from Stand Sit to Stand: Mod assist Stand pivot transfers: Mod assist;+2 physical assistance       General transfer comment: improved today but still requires +2 assist for safety  Ambulation/Gait   Ambulation Distance (Feet): 2 Feet Assistive device: Rolling [place] (2  wheeled) Gait Pattern/deviations: Step-through pattern   Gait velocity interpretation: <1.8 ft/sec, indicative of risk for recurrent falls General Gait Details: very small shuffling steps with assist at times to move LLE.  requires +2 assist   Stairs            Wheelchair Mobility    Modified Rankin (Stroke Patients Only)       Balance Overall balance assessment: History of Falls;Needs assistance Sitting-balance support: Feet supported;Single extremity supported Sitting balance-Leahy Scale: Fair Sitting balance - Comments: able to maintain sitting today without loss of balance.  unsafe to be left unattended.   Standing balance support: Bilateral upper extremity supported Standing balance-Leahy Scale: Poor Standing balance comment: Pt relies on RW and outside physical assist to maintain balance in upright                    Cognition Arousal/Alertness: Awake/alert Behavior During Therapy: WFL for tasks assessed/performed Overall Cognitive Status: Impaired/Different from baseline                 General Comments: per daughter.  Followed commands better today.    Exercises      General Comments        Pertinent Vitals/Pain Pain Assessment: No/denies pain    Home Living                      Prior Function            PT Goals (current goals can now be  found in the care plan section) Progress towards PT goals: Progressing toward goals    Frequency    7X/week      PT Plan Current plan remains appropriate    Co-evaluation             End of Session Equipment Utilized During Treatment: Gait belt Activity Tolerance: Patient tolerated treatment well Patient left: in bed;with call bell/phone within reach;with family/visitor present;with nursing/sitter in room;with bed alarm set     Time: [id number] PT Time Calculation (min) (ACUTE ONLY): 51 min  Charges:  $Gait Training: 8-22 mins $Therapeutic Activity: 23-37 mins                     G Codes:      Carrie Glenn 10/12/2016, 10:00 AM

## 2016-10-12 NOTE — Progress Notes (Signed)
[facility] - [place] [facility]   PATIENT NAME: Carrie Glenn    MR#:  [id number]  DATE OF BIRTH:  02/23/1941  SUBJECTIVE:   Patient is here due to a left hip pain and noted to have a hematoma. Hemoglobin stable. Still remains altered and patient's daughters at bedside.  REVIEW OF SYSTEMS:    Review of Systems  Unable to perform ROS: Mental acuity   Tolerating Diet:  yes   DRUG ALLERGIES:   Allergies  Allergen Reactions  . Amlodipine Swelling  . Erythromycin Nausea Only  . Lexapro [Escitalopram Oxalate] Nausea Only  . Lipitor [Atorvastatin] Nausea And Vomiting  . Macrodantin [Nitrofurantoin Macrocrystal] Nausea Only  . Nitrofurantoin Nausea Only  . Sertraline Hcl Other (See Comments)    Patients gets jittery.  . Trazodone And Nefazodone Other (See Comments)    Patient gets insomnia.  . Amoxicillin Diarrhea, Nausea And Vomiting, Nausea Only and Rash  . Azithromycin Rash  . Effexor [Venlafaxine] Rash  . Metformin And Related Itching and Rash  . Penicillins Rash    VITALS:  Blood pressure (!) 94/42, pulse 72, temperature 98.2 F (36.8 C), temperature source Oral, resp. rate 17, height 5\' 3"  (1.6 m), weight 53.7 kg (118 lb 6.4 oz), SpO2 99 %.  PHYSICAL EXAMINATION:   Physical Exam  Constitutional: She is well-developed, well-nourished, and in no distress. No distress.  HENT:  Head: Normocephalic.  Hear of hearing  Eyes: No scleral icterus.  Neck: Normal range of motion. Neck supple. No JVD present. No tracheal deviation present.  Cardiovascular: Normal rate, regular rhythm and normal heart sounds.  Exam reveals no gallop and no friction rub.   No murmur heard. Pulmonary/Chest: Effort normal and breath sounds normal. No respiratory distress. She has no wheezes. She has no rales. She exhibits no tenderness.  Abdominal: Soft. Bowel sounds are normal. She exhibits no distension and no mass. There is no tenderness. There is no rebound and no guarding.   Musculoskeletal: Normal range of motion. She exhibits tenderness. She exhibits no edema.  Tenderness left hip she is able to move hip laterally and medially. There is swelling and some bruising at the left hip Left foot drop old  Neurological: She is alert.  Skin: Skin is warm. No rash noted. No erythema.  Psychiatric: Affect and judgment normal.      LABORATORY PANEL:   CBC  Recent Labs Lab 10/12/16 1012  WBC 6.2  HGB 8.2*  HCT 23.7*  PLT 114*   ------------------------------------------------------------------------------------------------------------------  Chemistries   Recent Labs Lab 10/09/16 1548  10/12/16 1012  NA 142  < > 138  K 2.5*  < > 3.3*  CL 102  < > 104  CO2 30  < > 29  GLUCOSE 241*  < > 164*  BUN 13  < > 15  CREATININE 1.10*  < > 1.07*  CALCIUM 8.4*  < > 8.6*  MG 1.5*  --   --   AST 26  --   --   ALT 6*  --   --   ALKPHOS 48  --   --   BILITOT 0.3  --   --   < > = values in this interval not displayed. ------------------------------------------------------------------------------------------------------------------  Cardiac Enzymes No results for input(s): TROPONINI in the last 168 hours. ------------------------------------------------------------------------------------------------------------------  RADIOLOGY:  Dg Chest 1 View  Result Date: 10/11/2016 CLINICAL DATA:  Fall and generalize weakness. EXAM: CHEST 1 VIEW COMPARISON:  None. FINDINGS: The heart size and mediastinal  contours are within normal limits. There is no evidence of pulmonary edema, consolidation, pneumothorax, nodule or pleural fluid. Stable large hiatal hernia. The visualized skeletal structures are unremarkable. IMPRESSION: No active disease. Electronically Signed   By: Irish Lack M.D.   On: 10/11/2016 16:59   Ct Head Wo Contrast  Result Date: 10/12/2016 CLINICAL DATA:  Altered mental status.  Recent fall. EXAM: CT HEAD WITHOUT CONTRAST TECHNIQUE: Contiguous axial  images were obtained from the base of the skull through the vertex without intravenous contrast. COMPARISON:  October 09, 2016 FINDINGS: Brain: Mild diffuse atrophy is stable. There is no intracranial mass, hemorrhage, extra-axial fluid collection, or midline shift. Mild patchy small vessel disease in the centra semiovale bilaterally is stable. There is no new gray-white compartment lesion. No acute infarct evident. Vascular: There is no hyperdense vessel. There is calcification in each carotid siphon region. Skull: The bony calvarium appears intact. Sinuses/Orbits: Visualized paranasal sinuses are clear. Orbits appear symmetric bilaterally. Other: There is opacification of multiple mastoid air cells on the right. Mastoids on the left are clear. IMPRESSION: Stable mild atrophy with periventricular small vessel disease. No new gray-white compartment lesion. No intracranial mass, hemorrhage, or extra-axial fluid collection. There is mastoid air cell disease on the right. Mastoids on the left are clear. Areas of arterial vascular calcification noted. Electronically Signed   By: Bretta Bang III M.D.   On: 10/12/2016 10:19     ASSESSMENT AND PLAN:   76 y/o female With history of diabetes, Hypertension, GERD, history of frequent falls, chronic back pain, osteoporosis, legally blind and also very hard of hearing who presented to ED after a mechanical fall and complaining of left hip pain.  1. Left hip pain with swelling-secondary to left hip hematoma is noted on the MRI of the left hip. There is no obvious evidence of fracture on the MRI. -Appreciate orthopedic input and no plans for surgical intervention. Hemoglobin is stable. Pain control with as needed Norco.  - await PT eval.    2. Delerium/AMS - etiology unclear presently.  - UA (-). CXR (-), TSH, Ammonia normal.  CT head today showing no acute abnormality.  - appreciate Neuro eval and will get MRI, EEG.   - follow mental status.   3.  Hypokalemia: improved and resolved w/ supplementation.   4. Essential hypertension: Continue Avapro and metoprolol  5. Diabetes: Continue ADA diet and sliding scale insulin - BS stable  6. Depression/Anxiety - cont. Remeron/Xanax  7. GERD - cont. Protonix.   Pt. Daughter is very concerned about pt going home as she cannot handle her at home due to AMS/Weakness.  Possible discharge to SNF.   Management plans discussed with the patient and she is in agreement.  CODE STATUS: full  TOTAL TIME TAKING CARE OF THIS PATIENT: 35 minutes.   POSSIBLE D/C 1-2 days, DEPENDING ON CLINICAL CONDITION.   Houston Siren M.D on 10/12/2016 at 5:18 PM  Between 7am to 6pm - Pager - [phone]  After 6pm go to [email or web address] - password [email or web address]  [facility]  Office  [phone]  CC: Primary care physician; Leotis Shames, MD  Note: This dictation was prepared with Dragon dictation along with smaller phrase technology. Any transcriptional errors that result from this process are unintentional.

## 2016-10-12 NOTE — Progress Notes (Signed)
Shift assessment completed at 0730,see flowsheet. Pt is pale, on room air, asleep at time of assessment. Pt has hearing aids in place, lungs are clear, Hr is regular, abdomen is soft, bs heard. Pt is wearing in continence brief, is noted ot have bruised area to L lateral hip and L inner thigh. Ppp, no edema noted. PIV #24 intact to l hand, site is free of redness and swelling. Since assessment, Dr. Cherlynn KaiserSainani has been in on rounds and has discussed pt status with her daughter at length. Pt has had CT brain completed and neurological consult completed. Pt has slept.

## 2016-10-12 NOTE — Progress Notes (Signed)
Subjective:  Patient is more alert today. Her daughter is at the bedside. Patient reports pain as mild to moderate.  Patient is still having difficulty standing.  Objective:   VITALS:   Vitals:   10/12/16 0324 10/12/16 0751 10/12/16 1047 10/12/16 1200  BP: (!) 149/70 122/60 125/74 (!) 103/47  Pulse: 73 76 77 72  Resp: 17 17  16   Temp: 97.8 F (36.6 C) 98.7 F (37.1 C)  98.1 F (36.7 C)  TempSrc: Axillary Axillary  Axillary  SpO2: 100% 98%  97%  Weight:      Height:        PHYSICAL EXAM:  Left lower extremity: Patient's skin remains intact. Her hematoma is stable. She is tender overlying the lateral left hip and thigh. Distally she is neurovascular intact with intact motor function in palpable pedal pulses.   LABS  Results for orders placed or performed during the hospital encounter of 10/09/16 (from the past 24 hour(s))  Ammonia     Status: Abnormal   Collection Time: 10/11/16  3:32 PM  Result Value Ref Range   Ammonia <9 (L) 9 - 35 umol/L  Glucose, capillary     Status: Abnormal   Collection Time: 10/11/16  4:07 PM  Result Value Ref Range   Glucose-Capillary 179 (H) 65 - 99 mg/dL  Urinalysis, Routine w reflex microscopic     Status: Abnormal   Collection Time: 10/11/16  5:15 PM  Result Value Ref Range   Color, Urine YELLOW (A) YELLOW   APPearance CLEAR (A) CLEAR   Specific Gravity, Urine 1.013 1.005 - 1.030   pH 7.0 5.0 - 8.0   Glucose, UA NEGATIVE NEGATIVE mg/dL   Hgb urine dipstick NEGATIVE NEGATIVE   Bilirubin Urine NEGATIVE NEGATIVE   Ketones, ur NEGATIVE NEGATIVE mg/dL   Protein, ur NEGATIVE NEGATIVE mg/dL   Nitrite NEGATIVE NEGATIVE   Leukocytes, UA NEGATIVE NEGATIVE  Glucose, capillary     Status: Abnormal   Collection Time: 10/11/16  9:58 PM  Result Value Ref Range   Glucose-Capillary 158 (H) 65 - 99 mg/dL  Glucose, capillary     Status: Abnormal   Collection Time: 10/12/16  8:29 AM  Result Value Ref Range   Glucose-Capillary 149 (H) 65 - 99 mg/dL   CBC     Status: Abnormal   Collection Time: 10/12/16 10:12 AM  Result Value Ref Range   WBC 6.2 3.6 - 11.0 K/uL   RBC 2.51 (L) 3.80 - 5.20 MIL/uL   Hemoglobin 8.2 (L) 12.0 - 16.0 g/dL   HCT 11.923.7 (L) 14.735.0 - 82.947.0 %   MCV 94.4 80.0 - 100.0 fL   MCH 32.8 26.0 - 34.0 pg   MCHC 34.7 32.0 - 36.0 g/dL   RDW 56.213.8 13.011.5 - 86.514.5 %   Platelets 114 (L) 150 - 440 K/uL  Basic metabolic panel     Status: Abnormal   Collection Time: 10/12/16 10:12 AM  Result Value Ref Range   Sodium 138 135 - 145 mmol/L   Potassium 3.3 (L) 3.5 - 5.1 mmol/L   Chloride 104 101 - 111 mmol/L   CO2 29 22 - 32 mmol/L   Glucose, Bld 164 (H) 65 - 99 mg/dL   BUN 15 6 - 20 mg/dL   Creatinine, Ser 7.841.07 (H) 0.44 - 1.00 mg/dL   Calcium 8.6 (L) 8.9 - 10.3 mg/dL   GFR calc non Af Amer 49 (L) >60 mL/min   GFR calc Af Amer 57 (L) >60 mL/min   Anion  gap 5 5 - 15  Glucose, capillary     Status: Abnormal   Collection Time: 10/12/16 11:23 AM  Result Value Ref Range   Glucose-Capillary 279 (H) 65 - 99 mg/dL    Dg Chest 1 View  Result Date: 10/11/2016 CLINICAL DATA:  Fall and generalize weakness. EXAM: CHEST 1 VIEW COMPARISON:  None. FINDINGS: The heart size and mediastinal contours are within normal limits. There is no evidence of pulmonary edema, consolidation, pneumothorax, nodule or pleural fluid. Stable large hiatal hernia. The visualized skeletal structures are unremarkable. IMPRESSION: No active disease. Electronically Signed   By: Irish Lack M.D.   On: 10/11/2016 16:59   Ct Head Wo Contrast  Result Date: 10/12/2016 CLINICAL DATA:  Altered mental status.  Recent fall. EXAM: CT HEAD WITHOUT CONTRAST TECHNIQUE: Contiguous axial images were obtained from the base of the skull through the vertex without intravenous contrast. COMPARISON:  October 09, 2016 FINDINGS: Brain: Mild diffuse atrophy is stable. There is no intracranial mass, hemorrhage, extra-axial fluid collection, or midline shift. Mild patchy small vessel disease in  the centra semiovale bilaterally is stable. There is no new gray-white compartment lesion. No acute infarct evident. Vascular: There is no hyperdense vessel. There is calcification in each carotid siphon region. Skull: The bony calvarium appears intact. Sinuses/Orbits: Visualized paranasal sinuses are clear. Orbits appear symmetric bilaterally. Other: There is opacification of multiple mastoid air cells on the right. Mastoids on the left are clear. IMPRESSION: Stable mild atrophy with periventricular small vessel disease. No new gray-white compartment lesion. No intracranial mass, hemorrhage, or extra-axial fluid collection. There is mastoid air cell disease on the right. Mastoids on the left are clear. Areas of arterial vascular calcification noted. Electronically Signed   By: Bretta Bang III M.D.   On: 10/12/2016 10:19   Mr Hip Left Wo Contrast  Result Date: 10/10/2016 CLINICAL DATA:  Fall today with left hip pain and acute swelling. No fracture on radiography. EXAM: MR OF THE LEFT HIP WITHOUT CONTRAST TECHNIQUE: Multiplanar, multisequence MR imaging was performed. No intravenous contrast was administered. COMPARISON:  10/09/2016 and 01/08/2016 FINDINGS: Despite efforts by the technologist and patient, motion artifact is present on today's exam and could not be eliminated. This reduces exam sensitivity and specificity. Bones: No osseous edema to suggest fracture. Moderate degenerative arthropathy of both hips. Lower lumbar spondylosis and degenerative disc disease. Articular cartilage and labrum Articular cartilage: Chondral thinning in both hips both the axial an craniocaudad. Labrum:  Obscured by motion artifact. Joint or bursal effusion Joint effusion:  Absent Bursae:  Absent Muscles and tendons Muscles and tendons: Low-grade muscular edema in the gluteus medius muscle bilaterally and also deep to both iliacus muscles. There is also asymmetric edema tracking along the left hip adductor musculature  suspicious for adductor strain. Edema tracks along the distal left gluteus maximus musculotendinous junction. Other findings Miscellaneous: Extensive subcutaneous edema overlying the left hip with a 8.6 by 4.3 by 13.3 cm (volume = 260 cm^3)complex collection tracking in the subcutaneous tissues along the superficial fascia margin of the iliotibial band and tensor fascia lata. This collection has high T2 and intermediate T1 signal characteristics, and given the acute swelling in this vicinity this is thought represent a hematoma. Please note that hematoma along the superficial fascia margin can be due to shear injury and can sometimes require surgical intervention to prevent reaccumulation (Morel-Lavallee lesion). Small amount of presacral fluid without an appreciable sacral fracture. IMPRESSION: 1. 260 cc complex collection tracking in the subcutaneous tissues  along the superficial fascia margin of the left tensor fascia lata and iliotibial band, compatible with hematoma. 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 Considerable surrounding subcutaneous edema noted. 2. Suspected strain of the left hip adductor musculature which demonstrates asymmetric edema. 3. Fairly symmetric edema involving the gluteus minimus and medius muscles and tracking deep to the iliacus muscles, possibly chronic. 4. Small amount of presacral edema is present, but I do not see a definite sacral fracture. Electronically Signed   By: Gaylyn Rong M.D.   On: 10/10/2016 15:30    Assessment/Plan:     Active Problems:   Acute encephalopathy   Hypokalemia  Continue physical therapy as tolerated. Patient may weight-bear as tolerated in the left lower extremity. There is no evidence of fracture on her MRI. Patient will need skilled nursing facility upon discharge. She'll follow up with me in approximately 4 weeks after discharge.   Juanell Fairly , MD 10/12/2016, 12:57 PM

## 2016-10-13 ENCOUNTER — Inpatient Hospital Stay: Payer: Medicare Other

## 2016-10-13 LAB — POTASSIUM: POTASSIUM: 3.8 mmol/L (ref 3.5–5.1)

## 2016-10-13 LAB — GLUCOSE, CAPILLARY
GLUCOSE-CAPILLARY: 140 mg/dL — AB (ref 65–99)
GLUCOSE-CAPILLARY: 168 mg/dL — AB (ref 65–99)
Glucose-Capillary: 243 mg/dL — ABNORMAL HIGH (ref 65–99)
Glucose-Capillary: 94 mg/dL (ref 65–99)

## 2016-10-13 NOTE — Progress Notes (Signed)
Subjective: Patient more alert and interactive today.  Remains somewhat confused.    Objective: Current vital signs: BP (!) 154/80 (BP Location: Right Arm)   Pulse 73   Temp 97.6 F (36.4 C) (Oral)   Resp 16   Ht 5\' 3"  (1.6 m)   Wt 53.7 kg (118 lb 6.4 oz)   SpO2 94%   BMI 20.97 kg/m  Vital signs in last 24 hours: Temp:  [97.6 F (36.4 C)-99 F (37.2 C)] 97.6 F (36.4 C) (01/17 0823) Pulse Rate:  [72-82] 73 (01/17 0823) Resp:  [16-18] 16 (01/17 0823) BP: (94-154)/(42-80) 154/80 (01/17 0823) SpO2:  [94 %-99 %] 94 % (01/17 0823)  Intake/Output from previous day: 01/16 0701 - 01/17 0700 In: 603 [P.O.:600; I.V.:3] Out: 550 [Urine:550] Intake/Output this shift: Total I/O In: 240 [P.O.:240] Out: -  Nutritional status: Diet heart healthy/carb modified Room service appropriate? Yes; Fluid consistency: Thin  Neurologic Exam: Mental Status: Alert, oriented to name and place.  Makes no attempt to report the year.  Continues to ask the chair to be moved so she can see me.  Speech moreclear.  Able to follow commands without difficulty. Cranial Nerves: II: Patient legally blind but, pupils equal, round, reactive to light and accommodation III,IV, VI: ptosis not present, extra-ocular motions intact bilaterally V,VII: decrease in right NLF, facial light touch sensation normal bilaterally VIII: hearing aids in place IX,X: gag reflex present XI: bilateral shoulder shrug XII: midline tongue extension Motor: Generalized 5-/5 strength throughout with decreased effort in lifting the LLE due to pain.     Lab Results: Basic Metabolic Panel:  Recent Labs Lab 10/09/16 1548 10/10/16 0348 10/11/16 0357 10/12/16 1012 10/13/16 0631  NA 142 142  --  138  --   K 2.5* 3.1* 3.6 3.3* 3.8  CL 102 105  --  104  --   CO2 30 31  --  29  --   GLUCOSE 241* 123*  --  164*  --   BUN 13 16  --  15  --   CREATININE 1.10* 1.16* 1.14* 1.07*  --   CALCIUM 8.4* 7.8*  --  8.6*  --   MG 1.5*  --   --    --   --     Liver Function Tests:  Recent Labs Lab 10/09/16 1548  AST 26  ALT 6*  ALKPHOS 48  BILITOT 0.3  PROT 5.9*  ALBUMIN 3.6   No results for input(s): LIPASE, AMYLASE in the last 168 hours.  Recent Labs Lab 10/11/16 1532  AMMONIA <9*    CBC:  Recent Labs Lab 10/09/16 1548 10/10/16 0348 10/11/16 0357 10/12/16 1012  WBC 10.2 9.3 7.4 6.2  NEUTROABS 5.6  --   --   --   HGB 11.6* 8.6* 8.8* 8.2*  HCT 33.5* 24.5* 24.6* 23.7*  MCV 95.0 92.8 93.2 94.4  PLT 135* 92* 96* 114*    Cardiac Enzymes:  Recent Labs Lab 10/09/16 1548  CKTOTAL 45    Lipid Panel: No results for input(s): CHOL, TRIG, HDL, CHOLHDL, VLDL, LDLCALC in the last 168 hours.  CBG:  Recent Labs Lab 10/12/16 1123 10/12/16 1709 10/12/16 2057 10/13/16 0823 10/13/16 1200  GLUCAP 279* 155* 146* 140* 243*    Microbiology: Results for orders placed or performed in visit on 01/05/16  Microscopic Examination     Status: Abnormal   Collection Time: 01/05/16 10:49 AM  Result Value Ref Range Status   WBC, UA 6-10 (A) 0 - 5 /hpf  Final   RBC, UA 0-2 0 - 2 /hpf Final   Epithelial Cells (non renal) 0-10 0 - 10 /hpf Final   Bacteria, UA None seen None seen/Few Final    Coagulation Studies: No results for input(s): LABPROT, INR in the last 72 hours.  Imaging: Dg Chest 1 View  Result Date: 10/11/2016 CLINICAL DATA:  Fall and generalize weakness. EXAM: CHEST 1 VIEW COMPARISON:  None. FINDINGS: The heart size and mediastinal contours are within normal limits. There is no evidence of pulmonary edema, consolidation, pneumothorax, nodule or pleural fluid. Stable large hiatal hernia. The visualized skeletal structures are unremarkable. IMPRESSION: No active disease. Electronically Signed   By: Irish LackGlenn  Yamagata M.D.   On: 10/11/2016 16:59   Ct Head Wo Contrast  Result Date: 10/12/2016 CLINICAL DATA:  Altered mental status.  Recent fall. EXAM: CT HEAD WITHOUT CONTRAST TECHNIQUE: Contiguous axial  images were obtained from the base of the skull through the vertex without intravenous contrast. COMPARISON:  October 09, 2016 FINDINGS: Brain: Mild diffuse atrophy is stable. There is no intracranial mass, hemorrhage, extra-axial fluid collection, or midline shift. Mild patchy small vessel disease in the centra semiovale bilaterally is stable. There is no new gray-white compartment lesion. No acute infarct evident. Vascular: There is no hyperdense vessel. There is calcification in each carotid siphon region. Skull: The bony calvarium appears intact. Sinuses/Orbits: Visualized paranasal sinuses are clear. Orbits appear symmetric bilaterally. Other: There is opacification of multiple mastoid air cells on the right. Mastoids on the left are clear. IMPRESSION: Stable mild atrophy with periventricular small vessel disease. No new gray-white compartment lesion. No intracranial mass, hemorrhage, or extra-axial fluid collection. There is mastoid air cell disease on the right. Mastoids on the left are clear. Areas of arterial vascular calcification noted. Electronically Signed   By: Bretta BangWilliam  Woodruff III M.D.   On: 10/12/2016 10:19    Medications:  I have reviewed the patient's current medications. Scheduled: . ALPRAZolam  0.5 mg Oral Daily  . aspirin EC  81 mg Oral QPC supper  . enoxaparin (LOVENOX) injection  40 mg Subcutaneous Q24H  . gabapentin  300 mg Oral QPC supper  . insulin aspart  0-5 Units Subcutaneous QHS  . insulin aspart  0-9 Units Subcutaneous TID WC  . irbesartan  300 mg Oral Daily  . metoprolol  75 mg Oral BID  . mirtazapine  7.5 mg Oral QPC supper  . multivitamin with minerals  1 tablet Oral Daily  . pantoprazole  40 mg Oral BID  . potassium chloride  20 mEq Oral BID  . sodium chloride flush  3 mL Intravenous Q12H    Assessment/Plan: Patient appears some improved today but continues to have some confusion.  EEG is only significant for slowing.  Patient is refusing MRI of tte brain.     Recommendations: 1.  Continue ASA daily.  May re-attempt MRI in AM.     LOS: 1 day   Thana FarrLeslie Zaakirah Kistner, MD Neurology [phone] 10/13/2016  1:42 PM

## 2016-10-13 NOTE — Progress Notes (Signed)
Clinical Social Worker (CSW) contacted patient's daughter Camelia Engerri and presented bed offers. She chose [facility]. [facility] liaison is aware of accepted bed offer. Plan is for patient to D/C to [facility] Friday 10/15/16 when medically stable.   Baker Hughes IncorporatedBailey Euclide Granito, LCSW [phone]

## 2016-10-13 NOTE — Progress Notes (Signed)
[employer] - [place] at [facility]   PATIENT NAME: Shiah Berhow    MR#:  [id number]  DATE OF BIRTH:  11/30/1940  SUBJECTIVE:   Patient is here due to a left hip pain and noted to have a hematoma. Hemoglobin stable.  The patient is confused and demented. REVIEW OF SYSTEMS:    Review of Systems  Unable to perform ROS: Mental acuity   Tolerating Diet:  yes   DRUG ALLERGIES:   Allergies  Allergen Reactions  . Amlodipine Swelling  . Erythromycin Nausea Only  . Lexapro [Escitalopram Oxalate] Nausea Only  . Lipitor [Atorvastatin] Nausea And Vomiting  . Macrodantin [Nitrofurantoin Macrocrystal] Nausea Only  . Nitrofurantoin Nausea Only  . Sertraline Hcl Other (See Comments)    Patients gets jittery.  . Trazodone And Nefazodone Other (See Comments)    Patient gets insomnia.  . Amoxicillin Diarrhea, Nausea And Vomiting, Nausea Only and Rash  . Azithromycin Rash  . Effexor [Venlafaxine] Rash  . Metformin And Related Itching and Rash  . Penicillins Rash    VITALS:  Blood pressure (!) 85/58, pulse 72, temperature 98.5 F (36.9 C), temperature source Oral, resp. rate 16, height 5\' 3"  (1.6 m), weight 118 lb 6.4 oz (53.7 kg), SpO2 100 %.  PHYSICAL EXAMINATION:   Physical Exam  Constitutional: She is well-developed, well-nourished, and in no distress. No distress.  HENT:  Head: Normocephalic.  Eyes: No scleral icterus.  Neck: Normal range of motion. Neck supple. No JVD present. No tracheal deviation present.  Cardiovascular: Normal rate, regular rhythm and normal heart sounds.  Exam reveals no gallop and no friction rub.   No murmur heard. Pulmonary/Chest: Effort normal and breath sounds normal. No respiratory distress. She has no wheezes. She has no rales. She exhibits no tenderness.  Abdominal: Soft. Bowel sounds are normal. She exhibits no distension and no mass. There is no tenderness. There is no rebound and no guarding.  Musculoskeletal: Normal range of  motion. She exhibits no edema.  There is swelling and some bruising at the left hip Left foot drop old  Neurological:  She is awake but confused and demented.  Skin: Skin is warm. No rash noted. No erythema.      LABORATORY PANEL:   CBC  Recent Labs Lab 10/12/16 1012  WBC 6.2  HGB 8.2*  HCT 23.7*  PLT 114*   ------------------------------------------------------------------------------------------------------------------  Chemistries   Recent Labs Lab 10/09/16 1548  10/12/16 1012 10/13/16 0631  NA 142  < > 138  --   K 2.5*  < > 3.3* 3.8  CL 102  < > 104  --   CO2 30  < > 29  --   GLUCOSE 241*  < > 164*  --   BUN 13  < > 15  --   CREATININE 1.10*  < > 1.07*  --   CALCIUM 8.4*  < > 8.6*  --   MG 1.5*  --   --   --   AST 26  --   --   --   ALT 6*  --   --   --   ALKPHOS 48  --   --   --   BILITOT 0.3  --   --   --   < > = values in this interval not displayed. ------------------------------------------------------------------------------------------------------------------  Cardiac Enzymes No results for input(s): TROPONINI in the last 168 hours. ------------------------------------------------------------------------------------------------------------------  RADIOLOGY:  Dg Chest 1 View  Result Date: 10/11/2016 CLINICAL DATA:  Fall and generalize weakness. EXAM: CHEST 1 VIEW COMPARISON:  None. FINDINGS: The heart size and mediastinal contours are within normal limits. There is no evidence of pulmonary edema, consolidation, pneumothorax, nodule or pleural fluid. Stable large hiatal hernia. The visualized skeletal structures are unremarkable. IMPRESSION: No active disease. Electronically Signed   By: Irish LackGlenn  Yamagata M.D.   On: 10/11/2016 16:59   Ct Head Wo Contrast  Result Date: 10/12/2016 CLINICAL DATA:  Altered mental status.  Recent fall. EXAM: CT HEAD WITHOUT CONTRAST TECHNIQUE: Contiguous axial images were obtained from the base of the skull through the  vertex without intravenous contrast. COMPARISON:  October 09, 2016 FINDINGS: Brain: Mild diffuse atrophy is stable. There is no intracranial mass, hemorrhage, extra-axial fluid collection, or midline shift. Mild patchy small vessel disease in the centra semiovale bilaterally is stable. There is no new gray-white compartment lesion. No acute infarct evident. Vascular: There is no hyperdense vessel. There is calcification in each carotid siphon region. Skull: The bony calvarium appears intact. Sinuses/Orbits: Visualized paranasal sinuses are clear. Orbits appear symmetric bilaterally. Other: There is opacification of multiple mastoid air cells on the right. Mastoids on the left are clear. IMPRESSION: Stable mild atrophy with periventricular small vessel disease. No new gray-white compartment lesion. No intracranial mass, hemorrhage, or extra-axial fluid collection. There is mastoid air cell disease on the right. Mastoids on the left are clear. Areas of arterial vascular calcification noted. Electronically Signed   By: Bretta BangWilliam  Woodruff III M.D.   On: 10/12/2016 10:19     ASSESSMENT AND PLAN:   76 y/o female With history of diabetes, Hypertension, GERD, history of frequent falls, chronic back pain, osteoporosis, legally blind and also very hard of hearing who presented to ED after a mechanical fall and complaining of left hip pain.  1. Left hip pain with swelling-secondary to left hip hematoma is noted on the MRI of the left hip. There is no obvious evidence of fracture on the MRI. -Appreciate orthopedic input and no plans for surgical intervention. Hemoglobin is stable. Pain control with as needed Norco.  SNF per PT eval.    2. Delerium/AMS - etiology unclear presently.  - UA (-). CXR (-), TSH, Ammonia normal.  CT head showing no acute abnormality.  - appreciate Neuro eval and follow-up MRI, EEG.  Aspiration and fall precaution.  3. Hypokalemia: improved and resolved w/ supplementation.   4.  Essential hypertension: Continue Avapro and metoprolol  5. Diabetes: Continue ADA diet and sliding scale insulin - BS stable  6. Depression/Anxiety - cont. Remeron/Xanax  7. GERD - cont. Protonix.   Management plans discussed with the patient's daughter and she is in agreement.  CODE STATUS: full  TOTAL TIME TAKING CARE OF THIS PATIENT: 35 minutes.   POSSIBLE D/C to SNF in 2 days, DEPENDING ON CLINICAL CONDITION.   Shaune Pollackhen, Dashton Czerwinski M.D on 10/13/2016 at 4:07 PM  Between 7am to 6pm - Pager - [phone]  After 6pm go to [email or web address] - password [email or web address]  [facility] [place] [facility]  Office  [phone]  CC: Primary care physician; Leotis ShamesSingh,Jasmine, MD  Note: This dictation was prepared with Dragon dictation along with smaller phrase technology. Any transcriptional errors that result from this process are unintentional.

## 2016-10-13 NOTE — Progress Notes (Signed)
Physical Therapy Treatment Patient Details Name: Corliss Marcusrma J Ebel MRN: [id number] DOB: 07/26/1941 Today's Date: 10/13/2016    History of Present Illness Iola Dan HumphreysWalker  is a 76 y.o. female with a known history of Hypertension, diabetes, renal failure and arthritis. The patient fell at home. She complains of generalized weakness. She denies any syncope, loss of consciousness or seizure. She complains of left hip pain and swelling. X-ray didn't show any hip fracture    PT Comments    Pt in bed, alert and awake.  Talkative and telling jokes this am.  To edge of bed with mod a x 1.  She was able to stand and ambulate 110' with Digangi and min/mod a x 1 due to short shuffling gait with narrow BOS, balance deficits and assist to navigate with Symons due to visual deficits.  Overall significant improvement but remains unsafe to ambulate without assist.  SNF remains appropriate discharge plan to return to PLOF   Follow Up Recommendations  SNF     Equipment Recommendations       Recommendations for Other Services       Precautions / Restrictions Precautions Precautions: Fall;Other (comment) Precaution Comments: Pt blind and very HOH.  Does much better with hearing aids. Restrictions Weight Bearing Restrictions: No Other Position/Activity Restrictions:  no WB restrictions of LLE    Mobility  Bed Mobility Overal bed mobility: Needs Assistance Bed Mobility: Supine to Sit     Supine to sit: Mod assist     General bed mobility comments: improved participation today  Transfers Overall transfer level: Needs assistance Equipment used: Rolling Kozel (2 wheeled) Transfers: Sit to/from Stand Sit to Stand: Mod assist            Ambulation/Gait Ambulation/Gait assistance: Min assist;Mod assist Ambulation Distance (Feet): 110 Feet Assistive device: Rolling Hagerty (2 wheeled) Gait Pattern/deviations: Step-through pattern;Decreased step length - right;Decreased step length -  left;Shuffle;Narrow base of support   Gait velocity interpretation: <1.8 ft/sec, indicative of risk for recurrent falls General Gait Details: shuffling steps, unsteady, unsafe to ambulate without assist   Stairs            Wheelchair Mobility    Modified Rankin (Stroke Patients Only)       Balance Overall balance assessment: History of Falls;Needs assistance Sitting-balance support: Feet supported;Single extremity supported Sitting balance-Leahy Scale: Fair Sitting balance - Comments: able to maintain sitting today without loss of balance.  unsafe to be left unattended.   Standing balance support: Bilateral upper extremity supported Standing balance-Leahy Scale: Poor Standing balance comment: Pt relies on RW and outside physical assist to maintain balance in upright                    Cognition Arousal/Alertness: Awake/alert Behavior During Therapy: [facility] for tasks assessed/performed                   General Comments: More alert and conversive today.  Telling jokes and proud of her progress "they didn't think i would walk again yeaterday"    Exercises      General Comments        Pertinent Vitals/Pain Pain Assessment: No/denies pain    Home Living                      Prior Function            PT Goals (current goals can now be found in the care plan section) Progress towards PT goals:  Progressing toward goals    Frequency    7X/week      PT Plan Current plan remains appropriate    Co-evaluation             End of Session Equipment Utilized During Treatment: Gait belt Activity Tolerance: Patient tolerated treatment well Patient left: in chair;with call bell/phone within reach;with chair alarm set     Time: 1610-9604 PT Time Calculation (min) (ACUTE ONLY): 18 min  Charges:  $Gait Training: 8-22 mins                    G Codes:      Danielle Dess Nov 07, 2016, 10:31 AM

## 2016-10-13 NOTE — Progress Notes (Signed)
Subjective:  Patient continues to be confused. She is up out of bed to a chair. Patient is seen with her nurse in the room.   Objective:   VITALS:   Vitals:   10/12/16 2013 10/12/16 2128 10/13/16 0410 10/13/16 0823  BP: (!) 109/57  124/63 (!) 154/80  Pulse: 82  82 73  Resp: 18  18 16   Temp: 98.9 F (37.2 C) 99 F (37.2 C) 97.9 F (36.6 C) 97.6 F (36.4 C)  TempSrc: Oral Oral Oral Oral  SpO2: 94%  98% 94%  Weight:      Height:        PHYSICAL EXAM:  Left lower extremity: Patient's hematoma remains stable. She still has tenderness over the area of the hematoma. Distally she is neurovascular intact.  LABS  Results for orders placed or performed during the hospital encounter of 10/09/16 (from the past 24 hour(s))  Glucose, capillary     Status: Abnormal   Collection Time: 10/12/16 11:23 AM  Result Value Ref Range   Glucose-Capillary 279 (H) 65 - 99 mg/dL  Glucose, capillary     Status: Abnormal   Collection Time: 10/12/16  5:09 PM  Result Value Ref Range   Glucose-Capillary 155 (H) 65 - 99 mg/dL  Glucose, capillary     Status: Abnormal   Collection Time: 10/12/16  8:57 PM  Result Value Ref Range   Glucose-Capillary 146 (H) 65 - 99 mg/dL  Potassium     Status: None   Collection Time: 10/13/16  6:31 AM  Result Value Ref Range   Potassium 3.8 3.5 - 5.1 mmol/L  Glucose, capillary     Status: Abnormal   Collection Time: 10/13/16  8:23 AM  Result Value Ref Range   Glucose-Capillary 140 (H) 65 - 99 mg/dL    Dg Chest 1 View  Result Date: 10/11/2016 CLINICAL DATA:  Fall and generalize weakness. EXAM: CHEST 1 VIEW COMPARISON:  None. FINDINGS: The heart size and mediastinal contours are within normal limits. There is no evidence of pulmonary edema, consolidation, pneumothorax, nodule or pleural fluid. Stable large hiatal hernia. The visualized skeletal structures are unremarkable. IMPRESSION: No active disease. Electronically Signed   By: Irish LackGlenn  Yamagata M.D.   On: 10/11/2016  16:59   Ct Head Wo Contrast  Result Date: 10/12/2016 CLINICAL DATA:  Altered mental status.  Recent fall. EXAM: CT HEAD WITHOUT CONTRAST TECHNIQUE: Contiguous axial images were obtained from the base of the skull through the vertex without intravenous contrast. COMPARISON:  October 09, 2016 FINDINGS: Brain: Mild diffuse atrophy is stable. There is no intracranial mass, hemorrhage, extra-axial fluid collection, or midline shift. Mild patchy small vessel disease in the centra semiovale bilaterally is stable. There is no new gray-white compartment lesion. No acute infarct evident. Vascular: There is no hyperdense vessel. There is calcification in each carotid siphon region. Skull: The bony calvarium appears intact. Sinuses/Orbits: Visualized paranasal sinuses are clear. Orbits appear symmetric bilaterally. Other: There is opacification of multiple mastoid air cells on the right. Mastoids on the left are clear. IMPRESSION: Stable mild atrophy with periventricular small vessel disease. No new gray-white compartment lesion. No intracranial mass, hemorrhage, or extra-axial fluid collection. There is mastoid air cell disease on the right. Mastoids on the left are clear. Areas of arterial vascular calcification noted. Electronically Signed   By: Bretta BangWilliam  Woodruff III M.D.   On: 10/12/2016 10:19    Assessment/Plan:     Active Problems:   Acute encephalopathy   Hypokalemia   Patient will  continue physical therapy as tolerated.  Continue to monitor hemoglobin.  Patient may be discharged to skilled nursing facility from an orthopedic standpoint once cleared by medicine.   Juanell Fairly , MD 10/13/2016, 10:12 AM

## 2016-10-14 ENCOUNTER — Inpatient Hospital Stay: Payer: Medicare Other

## 2016-10-14 LAB — GLUCOSE, CAPILLARY
GLUCOSE-CAPILLARY: 111 mg/dL — AB (ref 65–99)
GLUCOSE-CAPILLARY: 212 mg/dL — AB (ref 65–99)
Glucose-Capillary: 135 mg/dL — ABNORMAL HIGH (ref 65–99)
Glucose-Capillary: 139 mg/dL — ABNORMAL HIGH (ref 65–99)

## 2016-10-14 LAB — CBC
HEMATOCRIT: 20.1 % — AB (ref 35.0–47.0)
Hemoglobin: 7.1 g/dL — ABNORMAL LOW (ref 12.0–16.0)
MCH: 34 pg (ref 26.0–34.0)
MCHC: 35.4 g/dL (ref 32.0–36.0)
MCV: 96.2 fL (ref 80.0–100.0)
Platelets: 106 10*3/uL — ABNORMAL LOW (ref 150–440)
RBC: 2.09 MIL/uL — AB (ref 3.80–5.20)
RDW: 13.9 % (ref 11.5–14.5)
WBC: 6.1 10*3/uL (ref 3.6–11.0)

## 2016-10-14 LAB — PREPARE RBC (CROSSMATCH)

## 2016-10-14 MED ORDER — LOPERAMIDE HCL 2 MG PO CAPS
2.0000 mg | ORAL_CAPSULE | ORAL | Status: DC | PRN
  Filled 2016-10-14: qty 1

## 2016-10-14 MED ORDER — FUROSEMIDE 10 MG/ML IJ SOLN
20.0000 mg | Freq: Once | INTRAMUSCULAR | Status: AC
  Administered 2016-10-14: 20 mg via INTRAVENOUS
  Filled 2016-10-14: qty 2

## 2016-10-14 MED ORDER — SODIUM CHLORIDE 0.9 % IV SOLN
Freq: Once | INTRAVENOUS | Status: AC
  Administered 2016-10-14: 10:00:00 via INTRAVENOUS

## 2016-10-14 NOTE — Progress Notes (Signed)
PT Cancellation Note  Patient Details Name: Carrie Glenn MRN: [id number] DOB: 02/17/1941   Cancelled Treatment:    Reason Eval/Treat Not Completed: Medical issues which prohibited therapy   Hemoglobin noted to be 7.1 this am.  Will hold PT per protocol and continue as appropriate.   Danielle DessSarah Merci Walthers 10/14/2016, 8:28 AM

## 2016-10-14 NOTE — Progress Notes (Signed)
[employer] - [place] at [facility]   PATIENT NAME: Carrie Glenn    MR#:  [id number]  DATE OF BIRTH:  05/05/1941  SUBJECTIVE:   Patient is here due to a left hip pain and noted to have a hematoma. Hemoglobin stable.  The patient is awake, AAO times 3, but looks demented. No complaint. REVIEW OF SYSTEMS:    Review of Systems  Unable to perform ROS: Mental acuity   Tolerating Diet:  yes   DRUG ALLERGIES:   Allergies  Allergen Reactions  . Amlodipine Swelling  . Erythromycin Nausea Only  . Lexapro [Escitalopram Oxalate] Nausea Only  . Lipitor [Atorvastatin] Nausea And Vomiting  . Macrodantin [Nitrofurantoin Macrocrystal] Nausea Only  . Nitrofurantoin Nausea Only  . Sertraline Hcl Other (See Comments)    Patients gets jittery.  . Trazodone And Nefazodone Other (See Comments)    Patient gets insomnia.  . Amoxicillin Diarrhea, Nausea And Vomiting, Nausea Only and Rash  . Azithromycin Rash  . Effexor [Venlafaxine] Rash  . Metformin And Related Itching and Rash  . Penicillins Rash    VITALS:  Blood pressure (!) 141/65, pulse 67, temperature 97.4 F (36.3 C), temperature source Oral, resp. rate 18, height 5\' 3"  (1.6 m), weight 118 lb 6.4 oz (53.7 kg), SpO2 99 %.  PHYSICAL EXAMINATION:   Physical Exam  Constitutional: She is oriented to person, place, and time and well-developed, well-nourished, and in no distress. No distress.  HENT:  Head: Normocephalic.  Eyes: No scleral icterus.  Neck: Normal range of motion. Neck supple. No JVD present. No tracheal deviation present.  Cardiovascular: Normal rate, regular rhythm and normal heart sounds.  Exam reveals no gallop and no friction rub.   No murmur heard. Pulmonary/Chest: Effort normal and breath sounds normal. No respiratory distress. She has no wheezes. She has no rales. She exhibits no tenderness.  Abdominal: Soft. Bowel sounds are normal. She exhibits no distension and no mass. There is no tenderness.  There is no rebound and no guarding.  Musculoskeletal: Normal range of motion. She exhibits no edema.  There is swelling and more bruising at the left hip. Left foot drop old  Neurological: She is alert and oriented to person, place, and time. No cranial nerve deficit.  She looks demented.  Skin: Skin is warm. No rash noted. No erythema.   LABORATORY PANEL:   CBC  Recent Labs Lab 10/14/16 0426  WBC 6.1  HGB 7.1*  HCT 20.1*  PLT 106*   ------------------------------------------------------------------------------------------------------------------  Chemistries   Recent Labs Lab 10/09/16 1548  10/12/16 1012 10/13/16 0631  NA 142  < > 138  --   K 2.5*  < > 3.3* 3.8  CL 102  < > 104  --   CO2 30  < > 29  --   GLUCOSE 241*  < > 164*  --   BUN 13  < > 15  --   CREATININE 1.10*  < > 1.07*  --   CALCIUM 8.4*  < > 8.6*  --   MG 1.5*  --   --   --   AST 26  --   --   --   ALT 6*  --   --   --   ALKPHOS 48  --   --   --   BILITOT 0.3  --   --   --   < > = values in this interval not displayed. ------------------------------------------------------------------------------------------------------------------  Cardiac Enzymes No results for input(s): TROPONINI  in the last 168 hours. ------------------------------------------------------------------------------------------------------------------  RADIOLOGY:  Mr Brain Wo Contrast  Result Date: 10/14/2016 CLINICAL DATA:  Altered mental status.  Confusion and dementia EXAM: MRI HEAD WITHOUT CONTRAST TECHNIQUE: Multiplanar, multiecho pulse sequences of the brain and surrounding structures were obtained without intravenous contrast. COMPARISON:  04/07/2015 FINDINGS: Brain: No acute infarction, hemorrhage, hydrocephalus, extra-axial collection or mass lesion. Chronic microvascular disease with confluent ischemic gliosis in the periventricular white matter. Small scattered remote lacunar infarcts in the bilateral centrum semiovale,  far posterior right putamen, and left cerebellum. 3 foci of susceptibility artifact located in the left temporal and right frontal cortex, usually remote microhemorrhage. These are nonspecific, usually amyloid angiopathy is more extensive. History of dementia. Brain volume is normal for age. No mesial temporal atrophy. Vascular: Tortuous intracranial vessels which cause chronic flattening of the upper ventral pons. Preserved flow voids. Skull and upper cervical spine: No marrow lesion noted. Sinuses/Orbits: Chronic bilateral mastoid fluid and mucosal thickening. Bilateral cataract resection. IMPRESSION: 1. No acute finding or significant change since 2016. 2. Chronic microvascular disease as described. Electronically Signed   By: Marnee Spring M.D.   On: 10/14/2016 09:31     ASSESSMENT AND PLAN:   76 y/o female With history of diabetes, Hypertension, GERD, history of frequent falls, chronic back pain, osteoporosis, legally blind and also very hard of hearing who presented to ED after a mechanical fall and complaining of left hip pain.  1. Left hip pain with swelling-secondary to left hip hematoma is noted on the MRI of the left hip. There is no obvious evidence of fracture on the MRI. -Appreciate orthopedic input and no plans for surgical intervention. Hemoglobin is stable. Pain control with as needed Norco.  SNF per PT eval.    2. Delerium/AMS - etiology unclear presently.  - UA (-). CXR (-), TSH, Ammonia normal.  CT head showing no acute abnormality.  No acute finding or significant change since 2016 per brain MRI.  EEG is only significant for slowing.  Aspiration and fall precaution.  3. Hypokalemia: improved and resolved w/ supplementation.   4. Essential hypertension: Continue Avapro and metoprolol  5. Diabetes: Continue ADA diet and sliding scale insulin - BS stable  6. Depression/Anxiety - cont. Remeron/Xanax  7. GERD - cont. Protonix.   Anemia of chronic disease and acute blood  loss due to left hip hematoma. Hold aspirin and Lovenox for DVT prophylaxis due to decreased hemoglobin. PRBC 1 units transfusion today, follow-up hemoglobin tomorrow.  Management plans discussed with the patient's daughter and she is in agreement.  CODE STATUS: full code.  TOTAL TIME TAKING CARE OF THIS PATIENT: 32 minutes.   POSSIBLE D/C to SNF in 2 days, DEPENDING ON CLINICAL CONDITION.   Shaune Pollack M.D on 10/14/2016 at 2:53 PM  Between 7am to 6pm - Pager - [phone]  After 6pm go to [email or web address] - password [employer]  [facility] [place] [facility]  Office  [phone]  CC: Primary care physician; Leotis Shames, MD  Note: This dictation was prepared with Dragon dictation along with smaller phrase technology. Any transcriptional errors that result from this process are unintentional.

## 2016-10-15 LAB — TYPE AND SCREEN
Blood Product Expiration Date: 201802032359
ISSUE DATE / TIME: 201801181009
UNIT TYPE AND RH: 7300

## 2016-10-15 LAB — HEMOGLOBIN: HEMOGLOBIN: 9.7 g/dL — AB (ref 12.0–16.0)

## 2016-10-15 LAB — GLUCOSE, CAPILLARY: GLUCOSE-CAPILLARY: 144 mg/dL — AB (ref 65–99)

## 2016-10-15 MED ORDER — HYDROCODONE-ACETAMINOPHEN 7.5-325 MG PO TABS: 1.0000 | ORAL_TABLET | Freq: Three times a day (TID) | ORAL | 0 refills | Status: DC | PRN

## 2016-10-15 MED ORDER — ALPRAZOLAM ER 0.5 MG PO TB24
0.5000 mg | ORAL_TABLET | Freq: Every day | ORAL | 0 refills | Status: DC
Start: 2016-10-15 — End: 2020-11-02

## 2016-10-15 NOTE — Clinical Social Work Placement (Signed)
   CLINICAL SOCIAL WORK PLACEMENT  NOTE  Date:  10/15/2016  Patient Details  Name: Carrie Glenn MRN: [id number] Date of Birth: 06/16/1941  Clinical Social Work is seeking post-discharge placement for this patient at the Skilled  Nursing Facility level of care (*CSW will initial, date and re-position this form in  chart as items are completed):  Yes   Patient/family provided with [place] Clinical Social Work Department's list of facilities offering this level of care within the geographic area requested by the patient (or if unable, by the patient's family).  Yes   Patient/family informed of their freedom to choose among providers that offer the needed level of care, that participate in Medicare, Medicaid or managed care program needed by the patient, have an available bed and are willing to accept the patient.  Yes   Patient/family informed of [facility] ownership interest [facility] and [facility], as well as of the fact that they are under no obligation to receive care at these facilities.  PASRR submitted to EDS on       PASRR number received on       Existing PASRR number confirmed on 10/12/16     FL2 transmitted to all facilities in geographic area requested by pt/family on 10/12/16     FL2 transmitted to all facilities within larger geographic area on       Patient informed that his/her managed care company has contracts with or will negotiate with certain facilities, including the following:        Yes   Patient/family informed of bed offers received.  Patient chooses bed at  ([facility] )     Physician recommends and patient chooses bed at      Patient to be transferred to  ([facility] ) on 10/15/16.  Patient to be transferred to facility by  [facility] )     Patient family notified on 10/15/16 of transfer.  Name of family member notified:   (Patient's daughter Camelia Engerri is aware of D/C today. )     PHYSICIAN       Additional Comment:     _______________________________________________ Doloris Servantes, Darleen CrockerBailey M, LCSW 10/15/2016, 11:07 AM

## 2016-10-15 NOTE — Progress Notes (Signed)
Pt being discharged to [facility] today. Report was called to Hardwick BingKim Rn, all questions answered. Discharge instructions reviewed and included in packet along with prescriptions. Pt and family are aware and in agreement with plan. PIV was removed. She is leaving with all her belongings, will be transported via EMS.

## 2016-10-15 NOTE — Care Management Important Message (Signed)
Important Message  Patient Details  Name: Carrie Glenn MRN: [id number] Date of Birth: 06/16/1941   Medicare Important Message Given:  Yes Copy of signed IM delivered to patient.    Collie SiadAngela Tyrelle Raczka, RN 10/15/2016, 8:02 AM

## 2016-10-15 NOTE — Progress Notes (Signed)
Patient is medically stable for D/C to [facility] today. Per [facility] liaison patient will go to room 804. RN will call report and arrange EMS for transport. Clinical Child psychotherapistocial Worker (CSW) sent [facility]C orders to [facility] via [employer]. CSW contacted patient's daughter Camelia Engerri and made her aware of above. Please reconsult if future social work needs arise. CSW signing off.   Baker Hughes IncorporatedBailey Amyjo Mizrachi, LCSW [phone]

## 2016-10-15 NOTE — Progress Notes (Signed)
Subjective:  Patient is seen in her room. She is having difficulty hearing this morning. She is in no acute distress. She denies any living and left hip or thigh pain.   Objective:   VITALS:   Vitals:   10/14/16 1252 10/14/16 1453 10/15/16 0424 10/15/16 0734  BP: (!) 150/75 (!) 141/65 (!) 162/82 (!) 169/83  Pulse: 65 67 65 77  Resp: 17 18  16   Temp: 98.1 F (36.7 C) 97.4 F (36.3 C) 97.5 F (36.4 C) 98 F (36.7 C)  TempSrc: Oral Oral Oral   SpO2: 98% 99% 99% 98%  Weight:      Height:        PHYSICAL EXAM:  Left hip/thigh: Patient's skin remains intact. She has resolving hematoma. Her thigh compartments remain soft and compressible. There is been no expansion of the hematoma. Distally patient remains neurovascularly intact.   LABS  Results for orders placed or performed during the hospital encounter of 10/09/16 (from the past 24 hour(s))  Glucose, capillary     Status: Abnormal   Collection Time: 10/14/16 12:08 PM  Result Value Ref Range   Glucose-Capillary 135 (H) 65 - 99 mg/dL   Comment 1 Notify RN   Glucose, capillary     Status: Abnormal   Collection Time: 10/14/16  5:04 PM  Result Value Ref Range   Glucose-Capillary 212 (H) 65 - 99 mg/dL  Glucose, capillary     Status: Abnormal   Collection Time: 10/14/16 10:12 PM  Result Value Ref Range   Glucose-Capillary 139 (H) 65 - 99 mg/dL  Hemoglobin     Status: Abnormal   Collection Time: 10/15/16  4:15 AM  Result Value Ref Range   Hemoglobin 9.7 (L) 12.0 - 16.0 g/dL  Glucose, capillary     Status: Abnormal   Collection Time: 10/15/16  7:37 AM  Result Value Ref Range   Glucose-Capillary 144 (H) 65 - 99 mg/dL    Mr Brain Wo Contrast  Result Date: 10/14/2016 CLINICAL DATA:  Altered mental status.  Confusion and dementia EXAM: MRI HEAD WITHOUT CONTRAST TECHNIQUE: Multiplanar, multiecho pulse sequences of the brain and surrounding structures were obtained without intravenous contrast. COMPARISON:  04/07/2015 FINDINGS:  Brain: No acute infarction, hemorrhage, hydrocephalus, extra-axial collection or mass lesion. Chronic microvascular disease with confluent ischemic gliosis in the periventricular white matter. Small scattered remote lacunar infarcts in the bilateral centrum semiovale, far posterior right putamen, and left cerebellum. 3 foci of susceptibility artifact located in the left temporal and right frontal cortex, usually remote microhemorrhage. These are nonspecific, usually amyloid angiopathy is more extensive. History of dementia. Brain volume is normal for age. No mesial temporal atrophy. Vascular: Tortuous intracranial vessels which cause chronic flattening of the upper ventral pons. Preserved flow voids. Skull and upper cervical spine: No marrow lesion noted. Sinuses/Orbits: Chronic bilateral mastoid fluid and mucosal thickening. Bilateral cataract resection. IMPRESSION: 1. No acute finding or significant change since 2016. 2. Chronic microvascular disease as described. Electronically Signed   By: Marnee SpringJonathon  Watts M.D.   On: 10/14/2016 09:31    Assessment/Plan:     Active Problems:   Acute encephalopathy   Hypokalemia Left thigh hematoma s/p fall   Patient's hematoma is improving clinically. She responded well to 1 unit of packed blood cells. Hemoglobin is much improved today. Patient may be weightbearing as tolerated on the left lower extremity as there was no evidence of fracture on her MRI. Patient will follow up with me in 1-2 weeks in the office for  reevaluation of the hematoma.  Recommend gentle compression on the left side.    Juanell Fairly , MD 10/15/2016, 9:54 AM

## 2016-10-15 NOTE — Discharge Instructions (Signed)
Heart healthy and ADA diet. Activity as tolerated. Fall and aspiration precaution. Follow up Hb in 3-5 days in SNF.

## 2016-10-15 NOTE — Discharge Summary (Signed)
[facility] NAME: Carrie Glenn    MR#:  [id number]  DATE OF BIRTH:  Feb 13, 1941  DATE OF ADMISSION:  10/09/2016   ADMITTING PHYSICIAN: Demetrios Loll, MD  DATE OF DISCHARGE: 10/15/2016 PRIMARY CARE PHYSICIAN: Singh,Jasmine, MD   ADMISSION DIAGNOSIS:  Hypokalemia [E87.6] Fall [W19.XXXA] Contusion of left hip, initial encounter [S70.02XA] Fall, initial encounter [W19.XXXA] DISCHARGE DIAGNOSIS:  Active Problems:   Acute encephalopathy   Hypokalemia left hip hematoma Anemia of chronic disease and acute blood loss due to left hip hematoma. SECONDARY DIAGNOSIS:   Past Medical History:  Diagnosis Date  . Acute encephalopathy 04/09/2015  . Acute renal failure ([place]) 03/09/2015  . Acute UTI 03/08/2015  . Arthritis   . Awareness of heartbeats 07/10/2014  . Blind    Legally  . Chronic back pain 03/08/2015  . Controlled type 2 diabetes mellitus without complication ([place]) 01/03/8118  . Degeneration of intervertebral disc of lumbar region 09/04/2015  . Degenerative arthritis of lumbar spine 08/15/2014  . Diabetes ([place]) 04/07/2015  . Diabetes mellitus without complication ([place])   . Difficult or painful urination 05/23/2014  . Excessive urination at night 01/31/2014  . Frequent falls 04/09/2015  . Gastro-esophageal reflux disease without esophagitis 09/19/2015  . Hard of hearing   . HCAP (healthcare-associated pneumonia) 04/09/2015  . HTN (hypertension) 04/07/2015  . Hypertension   . Hypotension 03/08/2015  . Osteoporosis   . Pain    Chronic back  . Retinitis pigmentosa of both eyes   . Type 2 diabetes mellitus ([place]) 11/28/2014   HOSPITAL COURSE:  76 y/o female With history of diabetes, Hypertension, GERD, history of frequent falls, chronic back pain, osteoporosis, legally blind and also very hard of hearing who presented to ED after a mechanical fall and complaining of left hip pain.  1. Left hip pain with swelling-secondary to left hip  hematoma is noted on the MRI of the left hip. There is no obvious evidence of fracture on the MRI. -Appreciate orthopedic input and no plans for surgical intervention. Hemoglobin is stable. Pain control with as needed Norco.  SNF per PT eval.    2. Delerium/AMS - etiology unclear presently.  - UA (-). CXR (-), TSH, Ammonia normal.  CT head showing no acute abnormality.  No acute finding or significant change since 2016 per brain MRI.  EEG is only significant for slowing.  Aspiration and fall precaution.  3. Hypokalemia: improved and resolved w/ supplementation.   4. Essential hypertension: Continue Avapro and metoprolol  5. Diabetes: Continue ADA diet and sliding scale insulin - BS stable  6. Depression/Anxiety - cont. Remeron/Xanax  7. GERD - cont. Protonix.   Anemia of chronic disease and acute blood loss due to left hip hematoma.  aspirin and Lovenox for DVT prophylaxis were hold due to decreased hemoglobin. S/p PRBC 1 units transfusion, hemoglobin is up to 9.7. Follow up Hb in 3-5 days in SNF.  DISCHARGE CONDITIONS:  Stable, discharge to SNF today. CONSULTS OBTAINED:  Treatment Team:  Alexis Goodell, MD DRUG ALLERGIES:   Allergies  Allergen Reactions  . Amlodipine Swelling  . Erythromycin Nausea Only  . Lexapro [Escitalopram Oxalate] Nausea Only  . Lipitor [Atorvastatin] Nausea And Vomiting  . Macrodantin [Nitrofurantoin Macrocrystal] Nausea Only  . Nitrofurantoin Nausea Only  . Sertraline Hcl Other (See Comments)    Patients gets jittery.  . Trazodone And Nefazodone Other (See Comments)    Patient gets insomnia.  . Amoxicillin Diarrhea, Nausea And Vomiting,  Nausea Only and Rash  . Azithromycin Rash  . Effexor [Venlafaxine] Rash  . Metformin And Related Itching and Rash  . Penicillins Rash   DISCHARGE MEDICATIONS:   Allergies as of 10/15/2016      Reactions   Amlodipine Swelling   Erythromycin Nausea Only   Lexapro [escitalopram Oxalate] Nausea Only    Lipitor [atorvastatin] Nausea And Vomiting   Macrodantin [nitrofurantoin Macrocrystal] Nausea Only   Nitrofurantoin Nausea Only   Sertraline Hcl Other (See Comments)   Patients gets jittery.   Trazodone And Nefazodone Other (See Comments)   Patient gets insomnia.   Amoxicillin Diarrhea, Nausea And Vomiting, Nausea Only, Rash   Azithromycin Rash   Effexor [venlafaxine] Rash   Metformin And Related Itching, Rash   Penicillins Rash      Medication List    TAKE these medications   ACCU-CHEK COMPACT CARE KIT Kit 1 kit by Other route See admin instructions.   acetaminophen 500 MG tablet Commonly known as:  TYLENOL Take 1,000 mg by mouth every 6 (six) hours as needed (pain). Reported on 01/05/2016   ALPRAZolam 0.5 MG 24 hr tablet Commonly known as:  XANAX XR Take 1 tablet (0.5 mg total) by mouth daily.   aspirin EC 81 MG tablet Take 81 mg by mouth daily after supper.   gabapentin 300 MG capsule Commonly known as:  NEURONTIN Take 300 mg by mouth daily after supper.   glucose blood test strip 1 each 2 (two) times daily.   HYDROcodone-acetaminophen 7.5-325 MG tablet Commonly known as:  NORCO Take 1 tablet by mouth 3 (three) times daily as needed for moderate pain or severe pain.   metoprolol 50 MG tablet Commonly known as:  LOPRESSOR Take 75 mg by mouth 2 (two) times daily.   mirtazapine 7.5 MG tablet Commonly known as:  REMERON Take 7.5 mg by mouth daily after supper.   multivitamin with minerals Tabs tablet Take 1 tablet by mouth daily. Centrum Silver   olmesartan 20 MG tablet Commonly known as:  BENICAR Take 40 mg by mouth daily.   omeprazole 20 MG tablet Commonly known as:  PRILOSEC OTC Take 20 mg by mouth 2 (two) times daily.   sitaGLIPtin 50 MG tablet Commonly known as:  JANUVIA Take 50 mg by mouth daily after supper.        DISCHARGE INSTRUCTIONS:  See AVS.  If you experience worsening of your admission symptoms, develop shortness of breath, life  threatening emergency, suicidal or homicidal thoughts you must seek medical attention immediately by calling 911 or calling your MD immediately  if symptoms less severe.  You Must read complete instructions/literature along with all the possible adverse reactions/side effects for all the Medicines you take and that have been prescribed to you. Take any new Medicines after you have completely understood and accpet all the possible adverse reactions/side effects.   Please note  You were cared for by a hospitalist during your hospital stay. If you have any questions about your discharge medications or the care you received while you were in the hospital after you are discharged, you can call the unit and asked to speak with the hospitalist on call if the hospitalist that took care of you is not available. Once you are discharged, your primary care physician will handle any further medical issues. Please note that NO REFILLS for any discharge medications will be authorized once you are discharged, as it is imperative that you return to your primary care physician (or establish a  relationship with a primary care physician if you do not have one) for your aftercare needs so that they can reassess your need for medications and monitor your lab values.    On the day of Discharge:  VITAL SIGNS:  Blood pressure (!) 169/83, pulse 77, temperature 98 F (36.7 C), resp. rate 16, height 5' 3"  (1.6 m), weight 118 lb 6.4 oz (53.7 kg), SpO2 98 %. PHYSICAL EXAMINATION:  GENERAL:  76 y.o.-year-old patient lying in the bed with no acute distress.  EYES: Pupils equal, round, reactive to light and accommodation. No scleral icterus. Extraocular muscles intact.  HEENT: Head atraumatic, normocephalic. Oropharynx and nasopharynx clear.  NECK:  Supple, no jugular venous distention. No thyroid enlargement, no tenderness.  LUNGS: Normal breath sounds bilaterally, no wheezing, rales,rhonchi or crepitation. No use of accessory  muscles of respiration.  CARDIOVASCULAR: S1, S2 normal. No murmurs, rubs, or gallops.  ABDOMEN: Soft, non-tender, non-distended. Bowel sounds present. No organomegaly or mass.  EXTREMITIES: No pedal edema, cyanosis, or clubbing. There is swelling and  bruising at the left hip. NEUROLOGIC: Cranial nerves II through XII are intact. Muscle strength 5/5 in all extremities. Sensation intact. Gait not checked.  PSYCHIATRIC: The patient is alert and oriented x 3. But looks demented.  SKIN: No obvious rash, lesion, or ulcer.  DATA REVIEW:   CBC  Recent Labs Lab 10/14/16 0426 10/15/16 0415  WBC 6.1  --   HGB 7.1* 9.7*  HCT 20.1*  --   PLT 106*  --     Chemistries   Recent Labs Lab 10/09/16 1548  10/12/16 1012 10/13/16 0631  NA 142  < > 138  --   K 2.5*  < > 3.3* 3.8  CL 102  < > 104  --   CO2 30  < > 29  --   GLUCOSE 241*  < > 164*  --   BUN 13  < > 15  --   CREATININE 1.10*  < > 1.07*  --   CALCIUM 8.4*  < > 8.6*  --   MG 1.5*  --   --   --   AST 26  --   --   --   ALT 6*  --   --   --   ALKPHOS 48  --   --   --   BILITOT 0.3  --   --   --   < > = values in this interval not displayed.   Microbiology Results  Results for orders placed or performed in visit on 01/05/16  Microscopic Examination     Status: Abnormal   Collection Time: 01/05/16 10:49 AM  Result Value Ref Range Status   WBC, UA 6-10 (A) 0 - 5 /hpf Final   RBC, UA 0-2 0 - 2 /hpf Final   Epithelial Cells (non renal) 0-10 0 - 10 /hpf Final   Bacteria, UA None seen None seen/Few Final    RADIOLOGY:  No results found.   Management plans discussed with the patient, her daughter, Dr. Dione Housekeeper and they are in agreement.  CODE STATUS:     Code Status Orders        Start     Ordered   10/09/16 2013  Full code  Continuous     10/09/16 2012    Code Status History    Date Active Date Inactive Code Status Order ID Comments User Context   04/07/2015 10:56 AM 04/10/2015  9:10 PM Full Code [id number]  Geradine Girt, DO Inpatient   04/07/2015  9:25 AM 04/07/2015 10:56 AM Full Code [id number]  Gennaro Africa, MD Inpatient   03/08/2015  9:55 PM 03/12/2015  4:43 PM Full Code [id number]  Idelle Crouch, MD Inpatient      TOTAL TIME TAKING CARE OF THIS PATIENT: 36 minutes.    Demetrios Loll M.D on 10/15/2016 at 9:56 AM  Between 7am to 6pm - Pager - [phone]  After 6pm go to [email or web address] - Proofreader  [facility]  Office  [phone]  CC: Primary care physician; Glendon Axe, MD   Note: This dictation was prepared with Dragon dictation along with smaller phrase technology. Any transcriptional errors that result from this process are unintentional.

## 2017-01-31 ENCOUNTER — Emergency Department
Admission: EM | Admit: 2017-01-31 | Discharge: 2017-01-31 | Disposition: A | Discharge status: Home / Self Care | Payer: Medicare Other | Attending: Emergency Medicine | Admitting: Emergency Medicine

## 2017-01-31 ENCOUNTER — Emergency Department: Payer: Medicare Other

## 2017-01-31 ENCOUNTER — Encounter: Payer: Self-pay | Admitting: Emergency Medicine

## 2017-01-31 DIAGNOSIS — I10 Essential (primary) hypertension: Secondary | ICD-10-CM | POA: Insufficient documentation

## 2017-01-31 DIAGNOSIS — E119 Type 2 diabetes mellitus without complications: Secondary | ICD-10-CM | POA: Diagnosis not present

## 2017-01-31 DIAGNOSIS — Z7984 Long term (current) use of oral hypoglycemic drugs: Secondary | ICD-10-CM | POA: Diagnosis not present

## 2017-01-31 DIAGNOSIS — Z79899 Other long term (current) drug therapy: Secondary | ICD-10-CM | POA: Insufficient documentation

## 2017-01-31 DIAGNOSIS — K5900 Constipation, unspecified: Secondary | ICD-10-CM | POA: Insufficient documentation

## 2017-01-31 LAB — URINALYSIS, COMPLETE (UACMP) WITH MICROSCOPIC
BILIRUBIN URINE: NEGATIVE
Bacteria, UA: NONE SEEN
GLUCOSE, UA: NEGATIVE mg/dL
HGB URINE DIPSTICK: NEGATIVE
KETONES UR: NEGATIVE mg/dL
LEUKOCYTES UA: NEGATIVE
NITRITE: NEGATIVE
PROTEIN: NEGATIVE mg/dL
Specific Gravity, Urine: 1.008 (ref 1.005–1.030)
pH: 7 (ref 5.0–8.0)

## 2017-01-31 LAB — COMPREHENSIVE METABOLIC PANEL
ALBUMIN: 4.6 g/dL (ref 3.5–5.0)
ALK PHOS: 61 U/L (ref 38–126)
ALT: <5 U/L — ABNORMAL LOW (ref 14–54)
ANION GAP: 11 (ref 5–15)
AST: 28 U/L (ref 15–41)
BILIRUBIN TOTAL: 0.9 mg/dL (ref 0.3–1.2)
BUN: 15 mg/dL (ref 6–20)
CALCIUM: 9.9 mg/dL (ref 8.9–10.3)
CO2: 30 mmol/L (ref 22–32)
Chloride: 98 mmol/L — ABNORMAL LOW (ref 101–111)
Creatinine, Ser: 1.1 mg/dL — ABNORMAL HIGH (ref 0.44–1.00)
GFR calc Af Amer: 55 mL/min — ABNORMAL LOW (ref >60)
GFR calc non Af Amer: 47 mL/min — ABNORMAL LOW (ref >60)
GLUCOSE: 160 mg/dL — AB (ref 65–99)
POTASSIUM: 3.4 mmol/L — AB (ref 3.5–5.1)
Sodium: 139 mmol/L (ref 135–145)
TOTAL PROTEIN: 7.7 g/dL (ref 6.5–8.1)

## 2017-01-31 LAB — CBC
HCT: 41.8 % (ref 35.0–47.0)
HEMOGLOBIN: 14.4 g/dL (ref 12.0–16.0)
MCH: 31.9 pg (ref 26.0–34.0)
MCHC: 34.5 g/dL (ref 32.0–36.0)
MCV: 92.6 fL (ref 80.0–100.0)
Platelets: 133 10*3/uL — ABNORMAL LOW (ref 150–440)
RBC: 4.51 MIL/uL (ref 3.80–5.20)
RDW: 12.8 % (ref 11.5–14.5)
WBC: 5.7 10*3/uL (ref 3.6–11.0)

## 2017-01-31 LAB — LIPASE, BLOOD: LIPASE: 23 U/L (ref 11–51)

## 2017-01-31 MED ORDER — SENNA 8.6 MG PO TABS: 2.0000 | ORAL_TABLET | Freq: Two times a day (BID) | ORAL | 0 refills | Status: DC

## 2017-01-31 MED ORDER — POLYETHYLENE GLYCOL 3350 17 GM/SCOOP PO POWD: ORAL | 0 refills | Status: DC

## 2017-01-31 MED ORDER — MAGNESIUM CITRATE PO SOLN
1.0000 | Freq: Once | ORAL | Status: AC
  Administered 2017-01-31: 1 via ORAL

## 2017-01-31 NOTE — ED Notes (Signed)
Pt unable to urinate at this time, given specimen cup and sent back out to lobby with family.

## 2017-01-31 NOTE — Discharge Instructions (Signed)
Your xray shows a large amount of stool in the colon from constipation. Take senna and MiraLax to increase bowel movements. Follow up with your doctor this week for continued monitoring of your symptoms.

## 2017-01-31 NOTE — ED Notes (Addendum)
Pt hard of hearing, pts advocate (daughter) with pt sts pt was started on new medication in March and since has had constipation issues. Pt regularly has BM every 3 days and since new medication has not been able to have regular BM. Last BM x1 week ago.

## 2017-01-31 NOTE — ED Provider Notes (Signed)
[facility] Emergency Department Provider Note  ____________________________________________  Time seen: Approximately 9:25 PM  I have reviewed the triage vital signs and the nursing notes.   HISTORY  Chief Complaint Constipation    HPI Carrie Glenn is a 76 y.o. female brought to the ED due to constipation. She denies any pain or other complaints and has been in her usual state of health. She normally has constipation where she would have a bowel movement once every 3 days. Recently she was started on Sinemet for Parkinson's disease. Since then, constipation is been worse and her last bowel movement was a week ago. No vomiting. Eating and drinking normally. Otherwise at her baseline state of health without other concerns. Constipation is been constant for the past week without aggravating or alleviating factors.     Past Medical History:  Diagnosis Date  . Acute encephalopathy 04/09/2015  . Acute renal failure ([place]) 03/09/2015  . Acute UTI 03/08/2015  . Arthritis   . Awareness of heartbeats 07/10/2014  . Blind    Legally  . Chronic back pain 03/08/2015  . Controlled type 2 diabetes mellitus without complication ([place]) 2/59/5638  . Degeneration of intervertebral disc of lumbar region 09/04/2015  . Degenerative arthritis of lumbar spine 08/15/2014  . Diabetes ([place]) 04/07/2015  . Diabetes mellitus without complication ([place])   . Difficult or painful urination 05/23/2014  . Excessive urination at night 01/31/2014  . Frequent falls 04/09/2015  . Gastro-esophageal reflux disease without esophagitis 09/19/2015  . Hard of hearing   . HCAP (healthcare-associated pneumonia) 04/09/2015  . HTN (hypertension) 04/07/2015  . Hypertension   . Hypotension 03/08/2015  . Osteoporosis   . Pain    Chronic back  . Retinitis pigmentosa of both eyes   . Type 2 diabetes mellitus ([place]) 11/28/2014     Patient Active Problem List   Diagnosis Date Noted  . Gastro-esophageal reflux  disease without esophagitis 09/19/2015  . Back pain, thoracic 09/19/2015  . Degeneration of intervertebral disc of lumbar region 09/04/2015  . Mixed incontinence 06/15/2015  . Anxiety 06/10/2015  . Controlled type 2 diabetes mellitus without complication ([place]) 75/64/3329  . HCAP (healthcare-associated pneumonia) 04/09/2015  . Acute encephalopathy 04/09/2015  . Frequent falls 04/09/2015  . Hypokalemia 04/09/2015  . HTN (hypertension) 04/07/2015  . Diabetes ([place]) 04/07/2015  . Acute renal failure ([place]) 03/09/2015  . Sepsis ([place]) 03/08/2015  . Acute UTI 03/08/2015  . Hypotension 03/08/2015  . Weakness generalized 03/08/2015  . Chronic back pain 03/08/2015  . 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 Type 2 diabetes mellitus ([place]) 11/28/2014  . Primary osteoarthritis of both knees 10/15/2014  . Pain in shoulder 08/15/2014  . 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 Degenerative arthritis of lumbar spine 08/15/2014  . Awareness of heartbeats 07/10/2014  . Difficult or painful urination 05/23/2014  . Excessive urination at night 01/31/2014     Past Surgical History:  Procedure Laterality Date  . HIATAL HERNIA REPAIR    . left foot surgery  Left    Hammer toe fixation-2nd toe  . 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 Authorizing Provider  acetaminophen (TYLENOL) 500 MG tablet Take 1,000 mg by mouth every 6 (six) hours as needed (pain). Reported on 01/05/2016    [provider]  ALPRAZolam (XANAX XR) 0.5 MG 24  hr tablet Take 1 tablet (0.5 mg total) by mouth daily. 10/15/16   Demetrios Loll, MD  aspirin EC 81 MG tablet Take 81 mg by mouth daily after supper.     [provider]  Blood Glucose Monitoring Suppl (ACCU-CHEK COMPACT CARE KIT) KIT 1 kit by Other route See admin instructions.    [provider]   gabapentin (NEURONTIN) 300 MG capsule Take 300 mg by mouth daily after supper.     [provider]  glucose blood test strip 1 each 2 (two) times daily.    [provider]  HYDROcodone-acetaminophen (NORCO) 7.5-325 MG tablet Take 1 tablet by mouth 3 (three) times daily as needed for moderate pain or severe pain. 10/15/16   Demetrios Loll, MD  metoprolol (LOPRESSOR) 50 MG tablet Take 75 mg by mouth 2 (two) times daily.    [provider]  mirtazapine (REMERON) 7.5 MG tablet Take 7.5 mg by mouth daily after supper.     [provider]  Multiple Vitamin (MULTIVITAMIN WITH MINERALS) TABS tablet Take 1 tablet by mouth daily. Centrum Silver    [provider]  olmesartan (BENICAR) 20 MG tablet Take 40 mg by mouth daily.  06/10/15   [provider]  omeprazole (PRILOSEC OTC) 20 MG tablet Take 20 mg by mouth 2 (two) times daily.     [provider]  polyethylene glycol powder (GLYCOLAX/MIRALAX) powder 1 cap fulls in a full glass of water, two times a day, for 5 days. 01/31/17   Carrie Mew, MD  senna (SENOKOT) 8.6 MG TABS tablet Take 2 tablets (17.2 mg total) by mouth 2 (two) times daily. 01/31/17   Carrie Mew, MD  sitaGLIPtin (JANUVIA) 50 MG tablet Take 50 mg by mouth daily after supper.  08/01/14   [provider]     Allergies Amlodipine; Erythromycin; Lexapro [escitalopram oxalate]; Lipitor [atorvastatin]; Macrodantin [nitrofurantoin macrocrystal]; Nitrofurantoin; Sertraline hcl; Trazodone and nefazodone; Amoxicillin; Azithromycin; Effexor [venlafaxine]; Metformin and related; and Penicillins   Family History  Problem Relation Age of Onset  . Stroke Mother   . Diabetes Mother   . Emphysema Father   . Kidney disease Neg Hx   . Bladder Cancer Neg Hx     Social History Social History  Substance Use Topics  . Smoking status: Never Smoker  . Smokeless tobacco: Never Used  . Alcohol use No    Review of  Systems  Constitutional:   No fever or chills.  ENT:   No sore throat. No rhinorrhea.Chronic hearing loss and vision loss Lymphatic: No swollen glands, No extremity swelling Endocrine: No hot/cold flashes. No significant weight change. No neck swelling. Cardiovascular:   No chest pain or syncope. Respiratory:   No dyspnea or cough. Gastrointestinal:   Negative for abdominal pain, vomiting and diarrhea. Positive constipation Genitourinary:   Negative for dysuria or difficulty urinating. Musculoskeletal:   Negative for focal pain or swelling Neurological:   Negative for headaches or weakness. All other systems reviewed and are negative except as documented above in ROS and HPI.  ____________________________________________   PHYSICAL EXAM:  VITAL SIGNS: ED Triage Vitals  Enc Vitals Group     BP 01/31/17 1715 (!) 165/88     Pulse Rate 01/31/17 1715 83     Resp 01/31/17 1715 18     Temp 01/31/17 1715 98.4 F (36.9 C)     Temp Source 01/31/17 1715 Oral     SpO2 01/31/17 1715 96 %     Weight 01/31/17 1715  115 lb (52.2 kg)     Height 01/31/17 1715 _0  (1.6 m)     Head Circumference --      Peak Flow --      Pain Score 01/31/17 1714 8     Pain Loc --      Pain Edu? --      Excl. in [place]? --     Vital signs reviewed, nursing assessments reviewed.   Constitutional:   Alert and oriented. Well appearing and in no distress. Eyes:   No scleral icterus. No conjunctival pallor. PERRL. EOMI.  No nystagmus. ENT   Head:   Normocephalic and atraumatic.   Nose:   No congestion/rhinnorhea. No septal hematoma   Mouth/Throat:   MMM, no pharyngeal erythema. No peritonsillar mass.    Neck:   No stridor. No SubQ emphysema. No meningismus. Hematological/Lymphatic/Immunilogical:   No cervical lymphadenopathy. Cardiovascular:   RRR. Symmetric bilateral radial and DP pulses.  No murmurs.  Respiratory:   Normal respiratory effort without tachypnea nor retractions. Breath sounds are  clear and equal bilaterally. No wheezes/rales/rhonchi. Gastrointestinal:   Soft and nontender. Mildly distended. There is no CVA tenderness.  No rebound, rigidity, or guarding. Rectal exam performed with nurse Lorriane Shire in room. No stool in the vault. Slight amount of brown stool. No bleeding. No hemorrhoids.  Genitourinary:   deferred Musculoskeletal:   Normal range of motion in all extremities. No joint effusions.  No lower extremity tenderness.  No edema. Neurologic:   Normal speech and language.  CN 2-10 normal. Motor grossly intact. No gross focal neurologic deficits are appreciated.  Skin:    Skin is warm, dry and intact. No rash noted.  No petechiae, purpura, or bullae.  ____________________________________________    LABS (pertinent positives/negatives) (all labs ordered are listed, but only abnormal results are displayed) Labs Reviewed  COMPREHENSIVE METABOLIC PANEL - Abnormal; Notable for the following:       Result Value   Potassium 3.4 (*)    Chloride 98 (*)    Glucose, Bld 160 (*)    Creatinine, Ser 1.10 (*)    ALT <5 (*)    GFR calc non Af Amer 47 (*)    GFR calc Af Amer 55 (*)    All other components within normal limits  CBC - Abnormal; Notable for the following:    Platelets 133 (*)    All other components within normal limits  URINALYSIS, COMPLETE (UACMP) WITH MICROSCOPIC - Abnormal; Notable for the following:    Color, Urine STRAW (*)    APPearance CLEAR (*)    Squamous Epithelial / LPF 0-5 (*)    All other components within normal limits  LIPASE, BLOOD   ____________________________________________   EKG    ____________________________________________    RADIOLOGY  Dg Abdomen Acute W/chest  Result Date: 01/31/2017 CLINICAL DATA:  Constipation for 1 week, abdominal fullness. EXAM: DG ABDOMEN ACUTE W/ 1V CHEST COMPARISON:  Chest radiograph 10/11/2016, and chest abdomen pelvis CT 01/08/2016 FINDINGS: Cardiomediastinal contours are unchanged with  aortic tortuosity and hiatal hernia. The lungs are clear. No consolidation or pleural fluid. Moderate to large stool burden throughout the colon. No small bowel dilatation. No evidence of free air. Elongated density projecting over the mid abdomen and pelvis consistent with pills. No radiopaque calculi. Scoliotic curvature of the spine. No acute osseous abnormalities are seen. IMPRESSION: Moderate to large stool burden consistent with constipation. No evidence of obstruction or free air. Electronically Signed   By: Fonnie Birkenhead.D.  On: 01/31/2017 22:16    ____________________________________________   PROCEDURES Procedures  ____________________________________________   INITIAL IMPRESSION / ASSESSMENT AND PLAN / ED COURSE  Pertinent labs & imaging results that were available during my care of the patient were reviewed by me and considered in my medical decision making (see chart for details).  Patient presents with constipation. No pain or other complaints. Exam is unremarkable, without fecal impaction in the rectum that can be digitally resolved. We'll check x-ray for constipation. Magnesium citrate trial. On exam the sigmoid colon feels floppy, so enema may not be successful   ----------------------------------------- 10:26 PM on 01/31/2017 -----------------------------------------  X-ray strongly consistent with constipation with large stool burden. It was viewed by me as well, no evidence of volvulus. No evidence of bowel obstruction. We'll discharge home on senna and MiraLAX. Follow up with primary care this week.      ____________________________________________   FINAL CLINICAL IMPRESSION(S) / ED DIAGNOSES  Final diagnoses:  Constipation, unspecified constipation type      New Prescriptions   POLYETHYLENE GLYCOL POWDER (GLYCOLAX/MIRALAX) POWDER    1 cap fulls in a full glass of water, two times a day, for 5 days.   SENNA (SENOKOT) 8.6 MG TABS TABLET    Take 2  tablets (17.2 mg total) by mouth 2 (two) times daily.     Portions of this note were generated with dragon dictation software. Dictation errors may occur despite best attempts at proofreading.    Carrie Mew, MD 01/31/17 2227

## 2017-01-31 NOTE — ED Triage Notes (Signed)
Pt via pov from home with daughter and granddaughter with constipation x 1 week. Pt is blind and HOH. She reports that she was recently started on medication for parkinson's and she has not had a real BM in one week. Pt takes colace 2x per day and still can't go. Pt alert & oriented with NAD noted.

## 2017-10-03 ENCOUNTER — Emergency Department
Admission: EM | Admit: 2017-10-03 | Discharge: 2017-10-04 | Disposition: A | Discharge status: Home / Self Care | Payer: Medicare Other | Attending: Emergency Medicine | Admitting: Emergency Medicine

## 2017-10-03 ENCOUNTER — Other Ambulatory Visit: Payer: Self-pay

## 2017-10-03 DIAGNOSIS — E119 Type 2 diabetes mellitus without complications: Secondary | ICD-10-CM | POA: Diagnosis not present

## 2017-10-03 DIAGNOSIS — R51 Headache: Secondary | ICD-10-CM | POA: Diagnosis not present

## 2017-10-03 DIAGNOSIS — R2242 Localized swelling, mass and lump, left lower limb: Secondary | ICD-10-CM | POA: Diagnosis not present

## 2017-10-03 DIAGNOSIS — Z7982 Long term (current) use of aspirin: Secondary | ICD-10-CM | POA: Diagnosis not present

## 2017-10-03 DIAGNOSIS — Z79899 Other long term (current) drug therapy: Secondary | ICD-10-CM | POA: Diagnosis not present

## 2017-10-03 DIAGNOSIS — H6012 Cellulitis of left external ear: Secondary | ICD-10-CM | POA: Insufficient documentation

## 2017-10-03 DIAGNOSIS — L03811 Cellulitis of head [any part, except face]: Secondary | ICD-10-CM

## 2017-10-03 DIAGNOSIS — I10 Essential (primary) hypertension: Secondary | ICD-10-CM | POA: Insufficient documentation

## 2017-10-03 DIAGNOSIS — G8929 Other chronic pain: Secondary | ICD-10-CM | POA: Diagnosis not present

## 2017-10-03 DIAGNOSIS — Z7984 Long term (current) use of oral hypoglycemic drugs: Secondary | ICD-10-CM | POA: Insufficient documentation

## 2017-10-03 NOTE — ED Triage Notes (Signed)
Per EMS, pt's reports elevated BP at home beginning this evening. EMS reports BP 210/100 with repeat BP check it was 176/96. Pt does state hx of HTN. Pt denies CP or SHOB. Per EMS, pt legally blind and deaf. Pt has hearing aid in left ear.

## 2017-10-03 NOTE — ED Notes (Signed)
Pt reports a headache, elevated blood pressure and left earache.  Pt states head hurts above left eye and has pain in left ear.  Blood pressure elevated and is taking bp meds.  Pt is blind.  Daughter with pt. And is her caregiver.

## 2017-10-04 ENCOUNTER — Emergency Department: Payer: Medicare Other

## 2017-10-04 DIAGNOSIS — I10 Essential (primary) hypertension: Secondary | ICD-10-CM | POA: Diagnosis not present

## 2017-10-04 LAB — URINALYSIS, COMPLETE (UACMP) WITH MICROSCOPIC
Bacteria, UA: NONE SEEN
Bilirubin Urine: NEGATIVE
Glucose, UA: 50 mg/dL — AB
Hgb urine dipstick: NEGATIVE
KETONES UR: NEGATIVE mg/dL
Leukocytes, UA: NEGATIVE
Nitrite: NEGATIVE
PH: 7 (ref 5.0–8.0)
Protein, ur: NEGATIVE mg/dL
SQUAMOUS EPITHELIAL / LPF: NONE SEEN
Specific Gravity, Urine: 1.01 (ref 1.005–1.030)

## 2017-10-04 LAB — COMPREHENSIVE METABOLIC PANEL
ALK PHOS: 62 U/L (ref 38–126)
ALT: <5 U/L — ABNORMAL LOW (ref 14–54)
AST: 30 U/L (ref 15–41)
Albumin: 4.6 g/dL (ref 3.5–5.0)
Anion gap: 9 (ref 5–15)
BUN: 14 mg/dL (ref 6–20)
CALCIUM: 9.6 mg/dL (ref 8.9–10.3)
CO2: 30 mmol/L (ref 22–32)
CREATININE: 0.94 mg/dL (ref 0.44–1.00)
Chloride: 102 mmol/L (ref 101–111)
GFR calc Af Amer: >60 mL/min (ref >60)
GFR calc non Af Amer: 57 mL/min — ABNORMAL LOW (ref >60)
Glucose, Bld: 180 mg/dL — ABNORMAL HIGH (ref 65–99)
Potassium: 3.2 mmol/L — ABNORMAL LOW (ref 3.5–5.1)
SODIUM: 141 mmol/L (ref 135–145)
Total Bilirubin: 0.6 mg/dL (ref 0.3–1.2)
Total Protein: 7.3 g/dL (ref 6.5–8.1)

## 2017-10-04 LAB — CBC
HCT: 37.7 % (ref 35.0–47.0)
HEMOGLOBIN: 13 g/dL (ref 12.0–16.0)
MCH: 32.7 pg (ref 26.0–34.0)
MCHC: 34.5 g/dL (ref 32.0–36.0)
MCV: 94.7 fL (ref 80.0–100.0)
Platelets: 153 10*3/uL (ref 150–440)
RBC: 3.98 MIL/uL (ref 3.80–5.20)
RDW: 13.5 % (ref 11.5–14.5)
WBC: 7.2 10*3/uL (ref 3.6–11.0)

## 2017-10-04 LAB — TROPONIN I: Troponin I: <0.03 ng/mL (ref <0.03)

## 2017-10-04 MED ORDER — CLINDAMYCIN HCL 150 MG PO CAPS
300.0000 mg | ORAL_CAPSULE | Freq: Once | ORAL | Status: AC
  Administered 2017-10-04: 300 mg via ORAL
  Filled 2017-10-04: qty 2

## 2017-10-04 MED ORDER — CLINDAMYCIN HCL 300 MG PO CAPS: 300.0000 mg | ORAL_CAPSULE | Freq: Three times a day (TID) | ORAL | 0 refills | Status: AC

## 2017-10-04 MED ORDER — ACETAMINOPHEN 325 MG PO TABS
650.0000 mg | ORAL_TABLET | Freq: Once | ORAL | Status: AC
  Administered 2017-10-04: 650 mg via ORAL
  Filled 2017-10-04: qty 2

## 2017-10-04 MED ORDER — METOPROLOL TARTRATE 25 MG PO TABS
25.0000 mg | ORAL_TABLET | Freq: Once | ORAL | Status: AC
  Administered 2017-10-04: 25 mg via ORAL
  Filled 2017-10-04: qty 1

## 2017-10-04 NOTE — ED Notes (Signed)

## 2017-10-04 NOTE — ED Notes (Signed)
Patient transported to X-ray 

## 2017-10-04 NOTE — ED Notes (Signed)
Report off to Kasey rn  

## 2017-10-04 NOTE — Discharge Instructions (Signed)
Please follow-up with your primary care physician for further evaluation of your blood pressure and the left ear redness and swelling.

## 2017-10-04 NOTE — ED Provider Notes (Signed)
[facility] Emergency Department Provider Note   ____________________________________________   First MD Initiated Contact with Patient 10/03/17 2340     (approximate)  I have reviewed the triage vital signs and the nursing notes.   HISTORY  Chief Complaint Hypertension    HPI Carrie Glenn is a 77 y.o. female who comes into the hospital today with some high blood pressure and headaches.  The patient is legally blind and deaf.  According to the patient's family she had a blood pressure of 222/110.  Her monitor also showed an irregular heartbeat.  The patient has pain in her left ear and swelling in her left ankle for the past week.  She is felt nauseous today and has been sleeping a lot.  The patient has been taking her medications and had some cough with some congestion so she received some Coricidin HBP.  The patient felt warm but they did not take her temperature.  Normally the patient's blood pressures in the 110s/70s.  The patient states that her headache is gone now although she has gone back and forth in saying this with the nurse.  The patient has had no chest pain and no shortness of breath.  She has not had any previous problems with her left ear.  She has had occasional problems with her blood pressure.  She is here today for evaluation.   Past Medical History:  Diagnosis Date  . Acute encephalopathy 04/09/2015  . Acute renal failure ([place]) 03/09/2015  . Acute UTI 03/08/2015  . Arthritis   . Awareness of heartbeats 07/10/2014  . Blind    Legally  . Chronic back pain 03/08/2015  . Controlled type 2 diabetes mellitus without complication ([place]) 05/14/2992  . Degeneration of intervertebral disc of lumbar region 09/04/2015  . Degenerative arthritis of lumbar spine 08/15/2014  . Diabetes ([place]) 04/07/2015  . Diabetes mellitus without complication ([place])   . Difficult or painful urination 05/23/2014  . Excessive urination at night 01/31/2014  . Frequent falls  04/09/2015  . Gastro-esophageal reflux disease without esophagitis 09/19/2015  . Hard of hearing   . HCAP (healthcare-associated pneumonia) 04/09/2015  . HTN (hypertension) 04/07/2015  . Hypertension   . Hypotension 03/08/2015  . Osteoporosis   . Pain    Chronic back  . Retinitis pigmentosa of both eyes   . Type 2 diabetes mellitus ([place]) 11/28/2014    Patient Active Problem List   Diagnosis Date Noted  . Gastro-esophageal reflux disease without esophagitis 09/19/2015  . Back pain, thoracic 09/19/2015  . Degeneration of intervertebral disc of lumbar region 09/04/2015  . Mixed incontinence 06/15/2015  . Anxiety 06/10/2015  . Controlled type 2 diabetes mellitus without complication ([place]) 71/69/6789  . HCAP (healthcare-associated pneumonia) 04/09/2015  . Acute encephalopathy 04/09/2015  . Frequent falls 04/09/2015  . Hypokalemia 04/09/2015  . HTN (hypertension) 04/07/2015  . Diabetes ([place]) 04/07/2015  . Acute renal failure ([place]) 03/09/2015  . Sepsis ([employer]) 03/08/2015  . Acute UTI 03/08/2015  . Hypotension 03/08/2015  . Weakness generalized 03/08/2015  . Chronic back pain 03/08/2015  . 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 Type 2 diabetes mellitus ([place]) 11/28/2014  . Primary osteoarthritis of both knees 10/15/2014  . Pain in shoulder 08/15/2014  . 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 Degenerative arthritis of lumbar spine 08/15/2014  . Awareness of heartbeats 07/10/2014  . Difficult or painful urination 05/23/2014  . Excessive urination at night 01/31/2014    Past Surgical History:  Procedure Laterality Date  . HIATAL HERNIA REPAIR    . left foot surgery  Left    Hammer toe fixation-2nd toe  . 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 Authorizing Provider  acetaminophen (TYLENOL) 500 MG tablet Take 1,000 mg by mouth every 6  (six) hours as needed (pain). Reported on 01/05/2016    [provider]  ALPRAZolam (XANAX XR) 0.5 MG 24 hr tablet Take 1 tablet (0.5 mg total) by mouth daily. 10/15/16   Demetrios Loll, MD  aspirin EC 81 MG tablet Take 81 mg by mouth daily after supper.     [provider]  Blood Glucose Monitoring Suppl (ACCU-CHEK COMPACT CARE KIT) KIT 1 kit by Other route See admin instructions.    [provider]  clindamycin (CLEOCIN) 300 MG capsule Take 1 capsule (300 mg total) by mouth 3 (three) times daily for 10 days. 10/04/17 10/14/17  Loney Hering, MD  gabapentin (NEURONTIN) 300 MG capsule Take 300 mg by mouth daily after supper.     [provider]  glucose blood test strip 1 each 2 (two) times daily.    [provider]  HYDROcodone-acetaminophen (NORCO) 7.5-325 MG tablet Take 1 tablet by mouth 3 (three) times daily as needed for moderate pain or severe pain. 10/15/16   Demetrios Loll, MD  metoprolol (LOPRESSOR) 50 MG tablet Take 75 mg by mouth 2 (two) times daily.    [provider]  mirtazapine (REMERON) 7.5 MG tablet Take 7.5 mg by mouth daily after supper.     [provider]  Multiple Vitamin (MULTIVITAMIN WITH MINERALS) TABS tablet Take 1 tablet by mouth daily. Centrum Silver    [provider]  olmesartan (BENICAR) 20 MG tablet Take 40 mg by mouth daily.  06/10/15   [provider]  omeprazole (PRILOSEC OTC) 20 MG tablet Take 20 mg by mouth 2 (two) times daily.     [provider]  polyethylene glycol powder (GLYCOLAX/MIRALAX) powder 1 cap fulls in a full glass of water, two times a day, for 5 days. 01/31/17   Carrie Mew, MD  senna (SENOKOT) 8.6 MG TABS tablet Take 2 tablets (17.2 mg total) by mouth 2 (two) times daily. 01/31/17   Carrie Mew, MD  sitaGLIPtin (JANUVIA) 50 MG tablet Take 50 mg by mouth daily after supper.  08/01/14   [provider]    Allergies Amlodipine; Erythromycin; Lexapro  [escitalopram oxalate]; Lipitor [atorvastatin]; Macrodantin [nitrofurantoin macrocrystal]; Nitrofurantoin; Sertraline hcl; Trazodone and nefazodone; Amoxicillin; Azithromycin; Effexor [venlafaxine]; Metformin and related; and Penicillins  Family History  Problem Relation Age of Onset  . Stroke Mother   . Diabetes Mother   . Emphysema Father   . Kidney disease Neg Hx   . Bladder Cancer Neg Hx     Social History Social History   Tobacco Use  . Smoking status: Never Smoker  . Smokeless tobacco: Never Used  Substance Use Topics  . Alcohol use: No  . Drug use: No    Review of Systems  Constitutional: Fatigue Eyes: No visual changes. ENT: Left ear pain Cardiovascular: Denies chest pain. Respiratory: Denies shortness of breath. Gastrointestinal: No abdominal pain.  No nausea, no vomiting.  No diarrhea.  No constipation. Genitourinary: Negative for dysuria. Musculoskeletal: Negative for back pain. Skin: Negative for rash. Neurological: Headache   ____________________________________________   PHYSICAL EXAM:  VITAL SIGNS: ED Triage Vitals  Enc Vitals Group     BP 10/03/17 2230 (!) 185/94     Pulse Rate 10/03/17 2230 (!) 55     Resp 10/03/17 2238 14     Temp 10/03/17 2238 98.2 F (36.8 C)     Temp Source 10/03/17 2238 Oral     SpO2 10/03/17 2230 96 %     Weight 10/03/17 2239 125 lb (56.7 kg)     Height 10/03/17 2239 5' 4"  (1.626 m)     Head Circumference --      Peak Flow --      Pain Score --      Pain Loc --      Pain Edu? --      Excl. in [place]? --     Constitutional: Alert and oriented. Well appearing and in mild distress. Ears: TMs gray flat and dull with no effusion or erythema there is some mild redness to the left pinna Eyes: Conjunctivae are normal. PERRL. EOMI. Head: Atraumatic. Nose: No congestion/rhinnorhea. Mouth/Throat: Mucous membranes are moist.  Oropharynx non-erythematous. Cardiovascular: Normal rate, regular rhythm. Grossly normal heart sounds.   Good peripheral circulation. Respiratory: Normal respiratory effort.  No retractions. Lungs CTAB. Gastrointestinal: Soft and nontender. No distention.  Positive bowel sounds Musculoskeletal: No lower extremity tenderness nor edema.   Neurologic:  Normal speech and language.  Cranial nerves II through XII are grossly intact with no focal motor neuro deficit Skin:  Skin is warm, dry and intact.  Psychiatric: Mood and affect are normal.   ____________________________________________   LABS (all labs ordered are listed, but only abnormal results are displayed)  Labs Reviewed  URINALYSIS, COMPLETE (UACMP) WITH MICROSCOPIC - Abnormal; Notable for the following components:      Result Value   Color, Urine YELLOW (*)    APPearance CLEAR (*)    Glucose, UA 50 (*)    All other components within normal limits  COMPREHENSIVE METABOLIC PANEL - Abnormal; Notable for the following components:   Potassium 3.2 (*)    Glucose, Bld 180 (*)    ALT <5 (*)    GFR calc non Af Amer 57 (*)    All other components within normal limits  CBC  TROPONIN I   ____________________________________________  EKG  ED ECG REPORT I, Loney Hering, the attending physician, personally viewed and interpreted this ECG.   Date: 10/03/2017  EKG Time: 2238  Rate: 54  Rhythm: normal sinus rhythm  Axis: right axis deviation  Intervals:none  ST&T Change: T wave flattening in lead III, V3, V4,V5,V6  ____________________________________________  RADIOLOGY  Dg Chest 2 View  Result Date: 10/04/2017 CLINICAL DATA:  Hypertension this evening with cough. EXAM: CHEST  2 VIEW COMPARISON:  Chest CT 01/08/2016 FINDINGS: The heart size and mediastinal contours are within normal limits. Stable large sliding hiatal hernia is stable in appearance. The patient is status post ACDF of lower cervical spine. Chronic stable degenerative disc disease with stable T11 mild anterior compression is stable. IMPRESSION: No active  cardiopulmonary disease. Stable large sliding hiatal hernia. Electronically Signed   By: Ashley Royalty M.D.   On: 10/04/2017 00:31   Ct Head Wo Contrast  Result Date: 10/04/2017 CLINICAL DATA:  Headache and hypertension. EXAM: CT HEAD WITHOUT CONTRAST TECHNIQUE: Contiguous axial images were obtained from the base of the skull through the vertex without intravenous contrast. COMPARISON:  10/12/2016 CT FINDINGS: Brain: Superficial atrophy, stable in appearance. 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 No acute intracranial hemorrhage, midline shift or edema. No large vascular territory infarct. No intra-axial mass nor extra-axial collections. Vascular: No hyperdense vessel or unexpected calcification.The Skull: Negative for fracture or focal lesion. Sinuses/Orbits: No acute finding. Bilateral lens replacements. Other: Partial right mastoid effusion. IMPRESSION: 1. Chronic partial right mastoid effusion. 2. Atrophy with mild moderate chronic vessel ischemic disease of white matter. Electronically Signed   By: Ashley Royalty M.D.   On: 10/04/2017 00:44    ____________________________________________   PROCEDURES  Procedure(s) performed: None  Procedures  Critical Care performed: No  ____________________________________________   INITIAL IMPRESSION / ASSESSMENT AND PLAN / ED COURSE  As part of my medical decision making, I reviewed the following data within the electronic MEDICAL RECORD NUMBER Notes from prior ED visits and [place] Controlled Substance Database   This is a 77 year old female who comes into the hospital today with some elevation of her blood pressure as well as some headache.  The family was concerned because she is also been complaining of some left ear pain.  I did send the patient for a CT scan of her head given her elevated blood pressure and headache.  The patient though reports that her headache is improved.  I also sent the patient for chest x-ray given her  upper respiratory symptoms.  She had some blood work drawn including a troponin and an EKG.  All of the patient's results has returned unremarkable.  Her initial blood pressure when I went into the room was improved in the 150s but it did start to go back up.  The patient also had some increased redness and swelling of her left ear.  She wears a hearing aid on the left side some my concern is she may have a cellulitis of her left pinna.  I decided to give the patient a dose of clindamycin as well as a dose of metoprolol.  Her heart rate was in the 60s-70s.  She received 25 mg.  Since the patient's blood work is unremarkable she will be discharged home to follow-up with her primary care physician.  The patient's family understands and agrees with this plan.  Clinical Course as of Oct 04 310  Tue Oct 04, 2017  0220 No DVT of the left lower extremity US Venous Img Lower Unilateral Left [AW]    Clinical Course User Index [AW] Loney Hering, MD     ____________________________________________   FINAL CLINICAL IMPRESSION(S) / ED DIAGNOSES  Final diagnoses:  Hypertension, unspecified type  Cellulitis of head except face     ED Discharge Orders        Ordered    clindamycin (CLEOCIN) 300 MG capsule  3 times daily     10/04/17 0300       Note:  This document was prepared using Dragon voice recognition software and may include unintentional dictation errors.    Loney Hering, MD 10/04/17 [phone]

## 2017-10-04 NOTE — ED Notes (Signed)
Pt up to bathroom with assistance.  Family with pt. Sinus brady on monitor.

## 2018-02-15 ENCOUNTER — Other Ambulatory Visit: Payer: Self-pay | Admitting: Internal Medicine

## 2018-02-15 DIAGNOSIS — E269 Hyperaldosteronism, unspecified: Secondary | ICD-10-CM

## 2018-02-24 ENCOUNTER — Ambulatory Visit
Admission: RE | Admit: 2018-02-24 | Discharge: 2018-02-24 | Disposition: A | Discharge status: Home / Self Care | Payer: Medicare Other | Source: Ambulatory Visit | Attending: Internal Medicine | Admitting: Internal Medicine

## 2018-02-24 DIAGNOSIS — K449 Diaphragmatic hernia without obstruction or gangrene: Secondary | ICD-10-CM | POA: Insufficient documentation

## 2018-02-24 DIAGNOSIS — E269 Hyperaldosteronism, unspecified: Secondary | ICD-10-CM | POA: Insufficient documentation

## 2018-02-24 DIAGNOSIS — I77811 Abdominal aortic ectasia: Secondary | ICD-10-CM | POA: Diagnosis not present

## 2018-02-24 DIAGNOSIS — I700 Atherosclerosis of aorta: Secondary | ICD-10-CM | POA: Insufficient documentation

## 2018-10-07 ENCOUNTER — Other Ambulatory Visit: Payer: Self-pay

## 2018-10-07 ENCOUNTER — Emergency Department
Admission: EM | Admit: 2018-10-07 | Discharge: 2018-10-08 | Disposition: A | Discharge status: Home / Self Care | Payer: Medicare Other | Attending: Emergency Medicine | Admitting: Emergency Medicine

## 2018-10-07 DIAGNOSIS — I10 Essential (primary) hypertension: Secondary | ICD-10-CM | POA: Diagnosis not present

## 2018-10-07 DIAGNOSIS — T50901A Poisoning by unspecified drugs, medicaments and biological substances, accidental (unintentional), initial encounter: Secondary | ICD-10-CM | POA: Diagnosis not present

## 2018-10-07 DIAGNOSIS — Z89422 Acquired absence of other left toe(s): Secondary | ICD-10-CM | POA: Diagnosis not present

## 2018-10-07 DIAGNOSIS — E119 Type 2 diabetes mellitus without complications: Secondary | ICD-10-CM | POA: Diagnosis not present

## 2018-10-07 LAB — TROPONIN I: Troponin I: <0.03 ng/mL (ref <0.03)

## 2018-10-07 LAB — COMPREHENSIVE METABOLIC PANEL
ALBUMIN: 4.3 g/dL (ref 3.5–5.0)
ALT: 11 U/L (ref 0–44)
ANION GAP: 10 (ref 5–15)
AST: 27 U/L (ref 15–41)
Alkaline Phosphatase: 70 U/L (ref 38–126)
BUN: 21 mg/dL (ref 8–23)
CO2: 25 mmol/L (ref 22–32)
Calcium: 10 mg/dL (ref 8.9–10.3)
Chloride: 102 mmol/L (ref 98–111)
Creatinine, Ser: 1.19 mg/dL — ABNORMAL HIGH (ref 0.44–1.00)
GFR calc Af Amer: 50 mL/min — ABNORMAL LOW (ref >60)
GFR calc non Af Amer: 43 mL/min — ABNORMAL LOW (ref >60)
GLUCOSE: 182 mg/dL — AB (ref 70–99)
POTASSIUM: 4 mmol/L (ref 3.5–5.1)
SODIUM: 137 mmol/L (ref 135–145)
Total Bilirubin: 0.7 mg/dL (ref 0.3–1.2)
Total Protein: 7.6 g/dL (ref 6.5–8.1)

## 2018-10-07 LAB — URINALYSIS, COMPLETE (UACMP) WITH MICROSCOPIC
BACTERIA UA: NONE SEEN
BILIRUBIN URINE: NEGATIVE
Glucose, UA: NEGATIVE mg/dL
HGB URINE DIPSTICK: NEGATIVE
Ketones, ur: NEGATIVE mg/dL
NITRITE: NEGATIVE
PH: 7 (ref 5.0–8.0)
PROTEIN: NEGATIVE mg/dL
Specific Gravity, Urine: 1.008 (ref 1.005–1.030)

## 2018-10-07 LAB — CBC WITH DIFFERENTIAL/PLATELET
Abs Immature Granulocytes: 0.02 10*3/uL (ref 0.00–0.07)
BASOS ABS: 0.1 10*3/uL (ref 0.0–0.1)
Basophils Relative: 1 %
EOS ABS: 0.1 10*3/uL (ref 0.0–0.5)
EOS PCT: 1 %
HCT: 35 % — ABNORMAL LOW (ref 36.0–46.0)
Hemoglobin: 11.7 g/dL — ABNORMAL LOW (ref 12.0–15.0)
Immature Granulocytes: 0 %
LYMPHS PCT: 23 %
Lymphs Abs: 1.3 10*3/uL (ref 0.7–4.0)
MCH: 32.6 pg (ref 26.0–34.0)
MCHC: 33.4 g/dL (ref 30.0–36.0)
MCV: 97.5 fL (ref 80.0–100.0)
Monocytes Absolute: 0.6 10*3/uL (ref 0.1–1.0)
Monocytes Relative: 11 %
NRBC: 0 % (ref 0.0–0.2)
Neutro Abs: 3.5 10*3/uL (ref 1.7–7.7)
Neutrophils Relative %: 64 %
PLATELETS: 186 10*3/uL (ref 150–400)
RBC: 3.59 MIL/uL — AB (ref 3.87–5.11)
RDW: 12.2 % (ref 11.5–15.5)
WBC: 5.5 10*3/uL (ref 4.0–10.5)

## 2018-10-07 LAB — URINE DRUG SCREEN, QUALITATIVE (ARMC ONLY)
Amphetamines, Ur Screen: NOT DETECTED
BARBITURATES, UR SCREEN: NOT DETECTED
BENZODIAZEPINE, UR SCRN: POSITIVE — AB
CANNABINOID 50 NG, UR ~~LOC~~: NOT DETECTED
Cocaine Metabolite,Ur ~~LOC~~: NOT DETECTED
MDMA (Ecstasy)Ur Screen: NOT DETECTED
METHADONE SCREEN, URINE: NOT DETECTED
Opiate, Ur Screen: POSITIVE — AB
Phencyclidine (PCP) Ur S: NOT DETECTED
Tricyclic, Ur Screen: NOT DETECTED

## 2018-10-07 LAB — SALICYLATE LEVEL

## 2018-10-07 LAB — ACETAMINOPHEN LEVEL

## 2018-10-07 MED ORDER — SODIUM CHLORIDE 0.9 % IV SOLN
1000.0000 mL | Freq: Once | INTRAVENOUS | Status: AC
  Administered 2018-10-07: 1000 mL via INTRAVENOUS

## 2018-10-07 NOTE — ED Triage Notes (Signed)
Pt BIB EMS from home for accidental overdose of Loratadine, pt is blind, deaf, and has some dementia. Reported by EMS that patient thought she was taking her routine night medications and accidentally took the wrong bottle. Pt awake and alert in triage, VSS.

## 2018-10-07 NOTE — ED Provider Notes (Signed)
[facility] Emergency Department Provider Note       Time seen: ----------------------------------------- 9:18 PM on 10/07/2018 -----------------------------------------  Level V caveat: History/ROS limited by dementia I have reviewed the triage vital signs and the nursing notes.  HISTORY   Chief Complaint No chief complaint on file.    HPI Carrie Glenn is a 78 y.o. female with a history of encephalopathy, chronic back pain, diabetes, healthcare associated pneumonia, who presents to the ED for overdose.  Patient reportedly has difficulty seeing and hearing, thought she was taking her regular medications but excellently overdosed on 10 mg loratadine.  She took approximately 3246 or so of these.  It is unknown as to the exact timeframe.  She denies any complaints on arrival.  Past Medical History:  Diagnosis Date  . Acute encephalopathy 04/09/2015  . Acute renal failure (H[facility]) 03/09/2015  . Acute UTI 03/08/2015  . Arthritis   . Awareness of heartbeats 07/10/2014  . Blind    Legally  . Chronic back pain 03/08/2015  . Controlled type 2 diabetes mellitus without complication (H[facility]) 06/10/2015  . Degeneration of intervertebral disc of lumbar region 09/04/2015  . Degenerative arthritis of lumbar spine 08/15/2014  . Diabetes ([facility]) 04/07/2015  . Diabetes mellitus without complication ([facility])   . Difficult or painful urination 05/23/2014  . Excessive urination at night 01/31/2014  . Frequent falls 04/09/2015  . Gastro-esophageal reflux disease without esophagitis 09/19/2015  . Hard of hearing   . HCAP (healthcare-associated pneumonia) 04/09/2015  . HTN (hypertension) 04/07/2015  . Hypertension   . Hypotension 03/08/2015  . Osteoporosis   . Pain    Chronic back  . Retinitis pigmentosa of both eyes   . Type 2 diabetes mellitus ([facility]) 11/28/2014    Patient Active Problem List   Diagnosis Date Noted  . Gastro-esophageal reflux disease without esophagitis 09/19/2015  . Back  pain, thoracic 09/19/2015  . Degeneration of intervertebral disc of lumbar region 09/04/2015  . Mixed incontinence 06/15/2015  . Anxiety 06/10/2015  . Controlled type 2 diabetes mellitus without complication ([facility]) 06/10/2015  . HCAP (healthcare-associated pneumonia) 04/09/2015  . Acute encephalopathy 04/09/2015  . Frequent falls 04/09/2015  . Hypokalemia 04/09/2015  . HTN (hypertension) 04/07/2015  . Diabetes (H[facility]) 04/07/2015  . Acute renal failure (H[facility]) 03/09/2015  . Sepsis (H[facility]) 03/08/2015  . Acute UTI 03/08/2015  . Hypotension 03/08/2015  . Weakness generalized 03/08/2015  . Chronic back pain 03/08/2015  . 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 Type 2 diabetes mellitus (H[facility]) 11/28/2014  . Primary osteoarthritis of both knees 10/15/2014  . Pain in shoulder 08/15/2014  . 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 Degenerative arthritis of lumbar spine 08/15/2014  . Awareness of heartbeats 07/10/2014  . Difficult or painful urination 05/23/2014  . Excessive urination at night 01/31/2014    Past Surgical History:  Procedure Laterality Date  . HIATAL HERNIA REPAIR    . left foot surgery  Left    Hammer toe fixation-2nd toe  . TOE AMPUTATION Left    Left 4th toe, partial amputation due to wound not healing    Allergies Amlodipine; Erythromycin; Lexapro [escitalopram oxalate]; Lipitor [atorvastatin]; Macrodantin [nitrofurantoin macrocrystal]; Nitrofurantoin; Sertraline hcl; Trazodone and nefazodone; Amoxicillin; Azithromycin; Effexor [venlafaxine]; Metformin and related; and Penicillins  Social History Social History   Tobacco Use  . Smoking status: Never Smoker  . Smokeless tobacco: Never Used  Substance Use Topics  . Alcohol use: No  . Drug use: No  Review of Systems Constitutional: Negative for fever. Cardiovascular: Negative for chest pain. Respiratory: Negative for shortness of breath. Gastrointestinal: Negative for abdominal pain,  vomiting and diarrhea. Musculoskeletal: Negative for back pain. Skin: Negative for rash. Neurological: Negative for headaches, focal weakness or numbness.  All systems negative/normal/unremarkable except as stated in the HPI  ____________________________________________   PHYSICAL EXAM:  VITAL SIGNS: ED Triage Vitals  Enc Vitals Group     BP      Pulse      Resp      Temp      Temp src      SpO2      Weight      Height      Head Circumference      Peak Flow      Pain Score      Pain Loc      Pain Edu?      Excl. in [place]?    Constitutional: Alert but disoriented, well appearing and in no distress. Eyes: Conjunctivae are normal. Normal extraocular movements. ENT      Head: Normocephalic and atraumatic.      Nose: No congestion/rhinnorhea.      Mouth/Throat: Mucous membranes are moist.      Neck: No stridor. Cardiovascular: Normal rate, regular rhythm. No murmurs, rubs, or gallops. Respiratory: Normal respiratory effort without tachypnea nor retractions. Breath sounds are clear and equal bilaterally. No wheezes/rales/rhonchi. Gastrointestinal: Soft and nontender. Normal bowel sounds Musculoskeletal: Nontender with normal range of motion in extremities. No lower extremity tenderness nor edema. Neurologic:  Normal speech and language. No gross focal neurologic deficits are appreciated.  Skin:  Skin is warm, dry and intact. No rash noted. Psychiatric: Mood and affect are normal. Speech and behavior are normal.  ____________________________________________  ED COURSE:  As part of my medical decision making, I reviewed the following data within the electronic MEDICAL RECORD NUMBER History obtained from family if available, nursing notes, old chart and ekg, as well as notes from prior ED visits. Patient presented for accidental overdose, we will assess with labs and imaging as indicated at this time.   Procedures ____________________________________________   LABS (pertinent  positives/negatives)  Labs Reviewed  URINALYSIS, COMPLETE (UACMP) WITH MICROSCOPIC - Abnormal; Notable for the following components:      Result Value   Color, Urine STRAW (*)    APPearance CLEAR (*)    Leukocytes, UA TRACE (*)    All other components within normal limits  URINE DRUG SCREEN, QUALITATIVE ([facility] ONLY) - Abnormal; Notable for the following components:   Opiate, Ur Screen POSITIVE (*)    Benzodiazepine, Ur Scrn POSITIVE (*)    All other components within normal limits  CBC WITH DIFFERENTIAL/PLATELET  COMPREHENSIVE METABOLIC PANEL  TROPONIN I  ACETAMINOPHEN LEVEL  SALICYLATE LEVEL  CBG MONITORING, ED   ____________________________________________   DIFFERENTIAL DIAGNOSIS   Overdose, dehydration, electrolyte abnormality, occult infection  FINAL ASSESSMENT AND PLAN  Accidental overdose   Plan: The patient had presented for accidental overdose. Patient's labs to this point have been unremarkable.  After period of observation she can likely be discharged home according to poison control.   Ulice Dash, MD    Note: This note was generated in part or whole with voice recognition software. Voice recognition is usually quite accurate but there are transcription errors that can and very often do occur. I apologize for any typographical errors that were not detected and corrected.     Emily Filbert, MD 10/07/18  2255  

## 2018-10-08 NOTE — ED Notes (Signed)
Spoke with poison control and updated them on patients status.

## 2018-10-08 NOTE — ED Notes (Signed)
Pt's POA, Aurther Loft, verbalized understanding of d/c instructions, and, f/u care. No further questions at this time. Pt wheeled to the lobby with family.

## 2018-10-08 NOTE — ED Provider Notes (Addendum)
-----------------------------------------   1:08 AM on 10/08/2018 -----------------------------------------  I took over care on this patient from Dr. Mayford KnifeWilliams.  At the time of signout, the plan was that the patient will be observed until approximately 1 AM and if her mental status and vital signs continued to be at its baseline, and the work-up was negative, she would be discharged home.  On reassessment the patient is alert and her mental status is at baseline.  Her vital signs are stable.  Work-up is negative.  The UA shows trace leukocytes and some WBCs but no bacteria and she has no elevated white blood cell count, fever, or other signs consistent with UTI.  She is stable for discharge home at this time.  I discussed return precautions with the family and they expressed understanding.  ED ECG REPORT I, Dionne BucySebastian Namrata Dangler, the attending physician, personally viewed and interpreted this ECG.  Date: 10/08/2018 EKG Time: 2354 Rate: 97 Rhythm: normal sinus rhythm QRS Axis: normal Intervals: normal ST/T Wave abnormalities: normal Narrative Interpretation: no evidence of acute ischemia     Dionne BucySiadecki, Juana Haralson, MD 10/08/18 0110

## 2018-10-08 NOTE — ED Notes (Signed)
Spoke with poison control and updated them on pt's status prior to d/c.

## 2020-11-02 ENCOUNTER — Emergency Department: Payer: Medicare Other

## 2020-11-02 ENCOUNTER — Emergency Department
Admission: EM | Admit: 2020-11-02 | Discharge: 2020-11-04 | Disposition: A | Discharge status: Home / Self Care | Payer: Medicare Other | Attending: Emergency Medicine | Admitting: Emergency Medicine

## 2020-11-02 ENCOUNTER — Other Ambulatory Visit: Payer: Self-pay

## 2020-11-02 DIAGNOSIS — E119 Type 2 diabetes mellitus without complications: Secondary | ICD-10-CM | POA: Diagnosis not present

## 2020-11-02 DIAGNOSIS — W06XXXA Fall from bed, initial encounter: Secondary | ICD-10-CM | POA: Insufficient documentation

## 2020-11-02 DIAGNOSIS — Z20822 Contact with and (suspected) exposure to covid-19: Secondary | ICD-10-CM | POA: Diagnosis not present

## 2020-11-02 DIAGNOSIS — S4992XA Unspecified injury of left shoulder and upper arm, initial encounter: Secondary | ICD-10-CM | POA: Diagnosis present

## 2020-11-02 DIAGNOSIS — Z7982 Long term (current) use of aspirin: Secondary | ICD-10-CM | POA: Insufficient documentation

## 2020-11-02 DIAGNOSIS — Z79899 Other long term (current) drug therapy: Secondary | ICD-10-CM | POA: Insufficient documentation

## 2020-11-02 DIAGNOSIS — Y92003 Bedroom of unspecified non-institutional (private) residence as the place of occurrence of the external cause: Secondary | ICD-10-CM | POA: Diagnosis not present

## 2020-11-02 DIAGNOSIS — S42212A Unspecified displaced fracture of surgical neck of left humerus, initial encounter for closed fracture: Secondary | ICD-10-CM | POA: Diagnosis not present

## 2020-11-02 DIAGNOSIS — G20 Parkinson's disease: Secondary | ICD-10-CM

## 2020-11-02 DIAGNOSIS — Z23 Encounter for immunization: Secondary | ICD-10-CM | POA: Insufficient documentation

## 2020-11-02 DIAGNOSIS — F0390 Unspecified dementia without behavioral disturbance: Secondary | ICD-10-CM | POA: Diagnosis not present

## 2020-11-02 LAB — URINALYSIS, COMPLETE (UACMP) WITH MICROSCOPIC
Bilirubin Urine: NEGATIVE
Glucose, UA: NEGATIVE mg/dL
Hgb urine dipstick: NEGATIVE
Ketones, ur: 5 mg/dL — AB
Nitrite: NEGATIVE
Protein, ur: NEGATIVE mg/dL
Specific Gravity, Urine: 1.019 (ref 1.005–1.030)
pH: 6 (ref 5.0–8.0)

## 2020-11-02 LAB — CBC WITH DIFFERENTIAL/PLATELET
Abs Immature Granulocytes: 0.04 10*3/uL (ref 0.00–0.07)
Basophils Absolute: 0.1 10*3/uL (ref 0.0–0.1)
Basophils Relative: 1 %
Eosinophils Absolute: 0.1 10*3/uL (ref 0.0–0.5)
Eosinophils Relative: 1 %
HCT: 34.3 % — ABNORMAL LOW (ref 36.0–46.0)
Hemoglobin: 11.2 g/dL — ABNORMAL LOW (ref 12.0–15.0)
Immature Granulocytes: 1 %
Lymphocytes Relative: 9 %
Lymphs Abs: 0.7 10*3/uL (ref 0.7–4.0)
MCH: 32.9 pg (ref 26.0–34.0)
MCHC: 32.7 g/dL (ref 30.0–36.0)
MCV: 100.9 fL — ABNORMAL HIGH (ref 80.0–100.0)
Monocytes Absolute: 0.4 10*3/uL (ref 0.1–1.0)
Monocytes Relative: 5 %
Neutro Abs: 6.6 10*3/uL (ref 1.7–7.7)
Neutrophils Relative %: 83 %
Platelets: 138 10*3/uL — ABNORMAL LOW (ref 150–400)
RBC: 3.4 MIL/uL — ABNORMAL LOW (ref 3.87–5.11)
RDW: 13.1 % (ref 11.5–15.5)
WBC: 7.8 10*3/uL (ref 4.0–10.5)
nRBC: 0 % (ref 0.0–0.2)

## 2020-11-02 LAB — BASIC METABOLIC PANEL
Anion gap: 10 (ref 5–15)
BUN: 17 mg/dL (ref 8–23)
CO2: 27 mmol/L (ref 22–32)
Calcium: 9.4 mg/dL (ref 8.9–10.3)
Chloride: 101 mmol/L (ref 98–111)
Creatinine, Ser: 1.08 mg/dL — ABNORMAL HIGH (ref 0.44–1.00)
GFR, Estimated: 52 mL/min — ABNORMAL LOW (ref >60)
Glucose, Bld: 185 mg/dL — ABNORMAL HIGH (ref 70–99)
Potassium: 4.2 mmol/L (ref 3.5–5.1)
Sodium: 138 mmol/L (ref 135–145)

## 2020-11-02 LAB — SARS CORONAVIRUS 2 BY RT PCR (HOSPITAL ORDER, PERFORMED IN ~~LOC~~ HOSPITAL LAB): SARS Coronavirus 2: NEGATIVE

## 2020-11-02 LAB — CBG MONITORING, ED: Glucose-Capillary: 155 mg/dL — ABNORMAL HIGH (ref 70–99)

## 2020-11-02 MED ORDER — MORPHINE SULFATE (PF) 4 MG/ML IV SOLN
4.0000 mg | Freq: Once | INTRAVENOUS | Status: AC
  Administered 2020-11-02: 4 mg via INTRAVENOUS
  Filled 2020-11-02: qty 1

## 2020-11-02 MED ORDER — DOCUSATE SODIUM 100 MG PO CAPS
100.0000 mg | ORAL_CAPSULE | Freq: Two times a day (BID) | ORAL | Status: DC
  Administered 2020-11-02 – 2020-11-04 (×4): 100 mg via ORAL
  Filled 2020-11-02 (×4): qty 1

## 2020-11-02 MED ORDER — FLUTICASONE PROPIONATE 50 MCG/ACT NA SUSP
1.0000 | Freq: Two times a day (BID) | NASAL | Status: DC
  Administered 2020-11-03 – 2020-11-04 (×3): 1 via NASAL
  Filled 2020-11-02: qty 16

## 2020-11-02 MED ORDER — PANTOPRAZOLE SODIUM 40 MG PO TBEC
40.0000 mg | DELAYED_RELEASE_TABLET | Freq: Every day | ORAL | Status: DC
  Administered 2020-11-02 – 2020-11-04 (×3): 40 mg via ORAL
  Filled 2020-11-02 (×3): qty 1

## 2020-11-02 MED ORDER — LINAGLIPTIN 5 MG PO TABS
5.0000 mg | ORAL_TABLET | Freq: Every day | ORAL | Status: DC
  Administered 2020-11-02 – 2020-11-04 (×3): 5 mg via ORAL
  Filled 2020-11-02 (×4): qty 1

## 2020-11-02 MED ORDER — MEMANTINE HCL 5 MG PO TABS
10.0000 mg | ORAL_TABLET | Freq: Two times a day (BID) | ORAL | Status: DC
Start: 2020-11-02 — End: 2020-11-04
  Administered 2020-11-02 – 2020-11-04 (×4): 10 mg via ORAL
  Filled 2020-11-02 (×4): qty 2

## 2020-11-02 MED ORDER — CARBIDOPA-LEVODOPA ER 25-100 MG PO TBCR
1.0000 | EXTENDED_RELEASE_TABLET | Freq: Two times a day (BID) | ORAL | Status: DC
  Administered 2020-11-02 – 2020-11-04 (×4): 1 via ORAL
  Filled 2020-11-02 (×6): qty 1

## 2020-11-02 MED ORDER — VITAMIN D 25 MCG (1000 UNIT) PO TABS
2000.0000 [IU] | ORAL_TABLET | Freq: Every day | ORAL | Status: DC
  Administered 2020-11-02 – 2020-11-04 (×3): 2000 [IU] via ORAL
  Filled 2020-11-02 (×3): qty 2

## 2020-11-02 MED ORDER — SPIRONOLACTONE 25 MG PO TABS
50.0000 mg | ORAL_TABLET | Freq: Two times a day (BID) | ORAL | Status: DC
  Administered 2020-11-02 – 2020-11-04 (×4): 50 mg via ORAL
  Filled 2020-11-02 (×6): qty 2

## 2020-11-02 MED ORDER — FELODIPINE ER 5 MG PO TB24
5.0000 mg | ORAL_TABLET | Freq: Every day | ORAL | Status: DC
  Administered 2020-11-02 – 2020-11-04 (×3): 5 mg via ORAL
  Filled 2020-11-02 (×5): qty 1

## 2020-11-02 MED ORDER — OXYBUTYNIN CHLORIDE ER 10 MG PO TB24
10.0000 mg | ORAL_TABLET | Freq: Every day | ORAL | Status: DC
  Administered 2020-11-02 – 2020-11-04 (×3): 10 mg via ORAL
  Filled 2020-11-02 (×4): qty 1

## 2020-11-02 MED ORDER — GABAPENTIN 100 MG PO CAPS
100.0000 mg | ORAL_CAPSULE | Freq: Every day | ORAL | Status: DC
  Administered 2020-11-02 – 2020-11-03 (×2): 100 mg via ORAL
  Filled 2020-11-02 (×2): qty 1

## 2020-11-02 MED ORDER — TETANUS-DIPHTH-ACELL PERTUSSIS 5-2.5-18.5 LF-MCG/0.5 IM SUSY
0.5000 mL | PREFILLED_SYRINGE | Freq: Once | INTRAMUSCULAR | Status: AC
  Administered 2020-11-02: 0.5 mL via INTRAMUSCULAR
  Filled 2020-11-02: qty 0.5

## 2020-11-02 MED ORDER — CALCIUM CARBONATE-VITAMIN D 500-200 MG-UNIT PO TABS
ORAL_TABLET | Freq: Every day | ORAL | Status: DC
  Administered 2020-11-03 – 2020-11-04 (×2): 1 via ORAL
  Filled 2020-11-02 (×4): qty 1

## 2020-11-02 MED ORDER — ONDANSETRON HCL 4 MG/2ML IJ SOLN
4.0000 mg | Freq: Once | INTRAMUSCULAR | Status: AC
  Administered 2020-11-02: 4 mg via INTRAVENOUS
  Filled 2020-11-02: qty 2

## 2020-11-02 MED ORDER — IRBESARTAN 150 MG PO TABS
150.0000 mg | ORAL_TABLET | Freq: Every day | ORAL | Status: DC
  Administered 2020-11-02 – 2020-11-04 (×3): 150 mg via ORAL
  Filled 2020-11-02 (×4): qty 1

## 2020-11-02 MED ORDER — MIRTAZAPINE 15 MG PO TABS
7.5000 mg | ORAL_TABLET | Freq: Every day | ORAL | Status: DC
  Administered 2020-11-02 – 2020-11-03 (×2): 7.5 mg via ORAL
  Filled 2020-11-02 (×2): qty 1

## 2020-11-02 MED ORDER — ASPIRIN EC 81 MG PO TBEC
81.0000 mg | DELAYED_RELEASE_TABLET | Freq: Every day | ORAL | Status: DC
  Administered 2020-11-02 – 2020-11-03 (×2): 81 mg via ORAL
  Filled 2020-11-02: qty 1

## 2020-11-02 MED ORDER — ADULT MULTIVITAMIN W/MINERALS CH
1.0000 | ORAL_TABLET | Freq: Every day | ORAL | Status: DC
  Administered 2020-11-02 – 2020-11-04 (×3): 1 via ORAL
  Filled 2020-11-02 (×3): qty 1

## 2020-11-02 MED ORDER — POLYSACCHARIDE IRON COMPLEX 150 MG PO CAPS
150.0000 mg | ORAL_CAPSULE | Freq: Every day | ORAL | Status: DC
  Administered 2020-11-03 – 2020-11-04 (×2): 150 mg via ORAL
  Filled 2020-11-02 (×3): qty 1

## 2020-11-02 MED ORDER — HYDROCODONE-ACETAMINOPHEN 7.5-325 MG PO TABS
1.5000 | ORAL_TABLET | Freq: Two times a day (BID) | ORAL | Status: DC | PRN
  Administered 2020-11-02: 1.5 via ORAL
  Filled 2020-11-02: qty 2

## 2020-11-02 NOTE — TOC Progression Note (Signed)
Transition of Care [facility]) - Progression Note    Patient Details  Name: Carrie Glenn MRN: [id number] Date of Birth: January 02, 1941  [facility] of [facility] CM/SW Contact  Larwance Rote, LCSW Phone Number: 11/02/2020, 2:48 PM  Clinical Narrative:   [facility] CM/SW consult received.  CSW assess no need at this time.  CSW will continue to follow.    Readmission Risk Interventions No flowsheet data found.

## 2020-11-02 NOTE — ED Provider Notes (Signed)
[facility] Emergency Department Provider Note  ____________________________________________  Time seen: Approximately 9:22 AM  I have reviewed the triage vital signs and the nursing notes.   HISTORY  Chief Complaint Fall    Level 5 Caveat: Portions of the History and Physical including HPI and review of systems are unable to be completely obtained due to patient being a poor historian   HPI Carrie Glenn is a 80 y.o. female with a history of dementia, Parkinson's disease, diabetes, hypertension who is brought to the ED after a fall this morning.  Patient was getting out of bed, tripped over a rug and fell onto her left side.  Complains of pain in the left hand and left shoulder.  Was given 50 mcg of fentanyl IV by EMS.  Denies headache or neck pain, no paresthesias or motor weakness.  No other acute complaints      Past Medical History:  Diagnosis Date  . Acute encephalopathy 04/09/2015  . Acute renal failure (HCC) 03/09/2015  . Acute UTI 03/08/2015  . Arthritis   . Awareness of heartbeats 07/10/2014  . Blind    Legally  . Chronic back pain 03/08/2015  . Controlled type 2 diabetes mellitus without complication (HCC) 06/10/2015  . Degeneration of intervertebral disc of lumbar region 09/04/2015  . Degenerative arthritis of lumbar spine 08/15/2014  . Diabetes (HCC) 04/07/2015  . Diabetes mellitus without complication (HCC)   . Difficult or painful urination 05/23/2014  . Excessive urination at night 01/31/2014  . Frequent falls 04/09/2015  . Gastro-esophageal reflux disease without esophagitis 09/19/2015  . Hard of hearing   . HCAP (healthcare-associated pneumonia) 04/09/2015  . HTN (hypertension) 04/07/2015  . Hypertension   . Hypotension 03/08/2015  . Osteoporosis   . Pain    Chronic back  . Retinitis pigmentosa of both eyes   . Type 2 diabetes mellitus (HCC) 11/28/2014     Patient Active Problem List   Diagnosis Date Noted  . Gastro-esophageal reflux  disease without esophagitis 09/19/2015  . Back pain, thoracic 09/19/2015  . Degeneration of intervertebral disc of lumbar region 09/04/2015  . Mixed incontinence 06/15/2015  . Anxiety 06/10/2015  . Controlled type 2 diabetes mellitus without complication (HCC) 06/10/2015  . HCAP (healthcare-associated pneumonia) 04/09/2015  . Acute encephalopathy 04/09/2015  . Frequent falls 04/09/2015  . Hypokalemia 04/09/2015  . HTN (hypertension) 04/07/2015  . Diabetes (HCC) 04/07/2015  . Acute renal failure (HCC) 03/09/2015  . Sepsis (HCC) 03/08/2015  . Acute UTI 03/08/2015  . Hypotension 03/08/2015  . Weakness generalized 03/08/2015  . Chronic back pain 03/08/2015  . 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 Type 2 diabetes mellitus (HCC) 11/28/2014  . Primary osteoarthritis of both knees 10/15/2014  . Pain in shoulder 08/15/2014  . 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 Degenerative arthritis of lumbar spine 08/15/2014  . Awareness of heartbeats 07/10/2014  . Difficult or painful urination 05/23/2014  . Excessive urination at night 01/31/2014     Past Surgical History:  Procedure Laterality Date  . HIATAL HERNIA REPAIR    . left foot surgery  Left    Hammer toe fixation-2nd toe  . 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 Authorizing Provider  ALPRAZolam Prudy Feeler(XANAX) 0.5 MG tablet Take 0.5 mg by mouth 2 (two) times daily as needed for anxiety or  sleep.   Yes [provider]  aspirin EC 81 MG tablet Take 81 mg by mouth daily after supper.    Yes [provider]  Calcium Carbonate-Vitamin D3 600-400 MG-UNIT TABS Take 1 tablet by mouth daily.   Yes [provider]  Carbidopa-Levodopa ER (SINEMET CR) 25-100 MG tablet controlled release Take 1 tablet by mouth 2 (two) times daily.   Yes [provider]   Cholecalciferol (VITAMIN D) 50 MCG (2000 UT) CAPS Take 2,000 Units by mouth daily.   Yes [provider]  cyclobenzaprine (FLEXERIL) 5 MG tablet Take 5 mg by mouth at bedtime as needed for muscle spasms.   Yes [provider]  docusate sodium (COLACE) 100 MG capsule Take 100 mg by mouth 2 (two) times daily.   Yes [provider]  felodipine (PLENDIL) 5 MG 24 hr tablet Take 5 mg by mouth daily.   Yes [provider]  fluticasone (FLONASE) 50 MCG/ACT nasal spray Place 1 spray into both nostrils 2 (two) times daily.   Yes [provider]  gabapentin (NEURONTIN) 100 MG capsule Take 100 mg by mouth at bedtime.   Yes [provider]  HYDROcodone-acetaminophen (NORCO) 7.5-325 MG tablet Take 1.5 tablets by mouth 2 (two) times daily as needed for moderate pain.   Yes [provider]  iron polysaccharides (NIFEREX) 150 MG capsule Take 150 mg by mouth daily.   Yes [provider]  memantine (NAMENDA) 10 MG tablet Take 10 mg by mouth 2 (two) times daily.   Yes [provider]  mirtazapine (REMERON) 7.5 MG tablet Take 7.5 mg by mouth at bedtime.   Yes [provider]  Multiple Vitamin (MULTIVITAMIN WITH MINERALS) TABS tablet Take 1 tablet by mouth daily.   Yes [provider]  olmesartan (BENICAR) 20 MG tablet Take 20 mg by mouth 2 (two) times daily.   Yes [provider]  omeprazole (PRILOSEC) 20 MG capsule Take 20 mg by mouth 2 (two) times daily.   Yes [provider]  oxybutynin (DITROPAN-XL) 10 MG 24 hr tablet Take 10 mg by mouth daily.   Yes [provider]  rosuvastatin (CRESTOR) 5 MG tablet Take 5 mg by mouth daily.   Yes [provider]  sertraline (ZOLOFT) 100 MG tablet Take 100 mg by mouth daily.   Yes [provider]  sitaGLIPtin (JANUVIA) 25 MG tablet Take 25 mg by mouth daily after supper. 08/01/14  Yes [provider]  spironolactone (ALDACTONE)  50 MG tablet Take 50 mg by mouth 2 (two) times daily.   Yes [provider]     Allergies Amlodipine, Erythromycin, Lexapro [escitalopram oxalate], Lipitor [atorvastatin], Macrodantin [nitrofurantoin macrocrystal], Nitrofurantoin, Sertraline hcl, Trazodone and nefazodone, Amoxicillin, Azithromycin, Effexor [venlafaxine], Metformin and related, and Penicillins   Family History  Problem Relation Age of Onset  . Stroke Mother   . Diabetes Mother   . Emphysema Father   . Kidney disease Neg Hx   . Bladder Cancer Neg Hx     Social History Social History   Tobacco Use  . Smoking status: Never Smoker  . Smokeless tobacco: Never Used  Substance Use Topics  . Alcohol use: No  . Drug use: No    Review of Systems Level 5 Caveat: Portions of the History and Physical including HPI and review of systems are unable to be completely obtained due to patient being a poor historian   Constitutional:   No known fever.  ENT:   No  rhinorrhea. Cardiovascular:   No chest pain or syncope. Respiratory:   No dyspnea or cough. Gastrointestinal:   Negative for abdominal pain, vomiting and diarrhea.  Musculoskeletal:   Left hand pain, left shoulder pain ____________________________________________   PHYSICAL EXAM:  VITAL SIGNS: ED Triage Vitals  Enc Vitals Group     BP 11/02/20 0819 (!) 166/78     Pulse Rate 11/02/20 0814 76     Resp 11/02/20 0814 19     Temp 11/02/20 0819 97.8 F (36.6 C)     Temp Source 11/02/20 0819 Oral     SpO2 11/02/20 0814 95 %     Weight 11/02/20 0815 112 lb 14.4 oz (51.2 kg)     Height 11/02/20 0815 5\' 6"  (1.676 m)     Head Circumference --      Peak Flow --      Pain Score 11/02/20 0815 9     Pain Loc --      Pain Edu? --      Excl. in GC? --     Vital signs reviewed, nursing assessments reviewed.   Constitutional:   Alert and oriented to person and place. Non-toxic appearance. Eyes:   Conjunctivae are normal. EOMI. PERRL. ENT      Head:    Normocephalic with contusion and abrasion of the left auricle.      Nose:   No congestion/rhinnorhea.  No epistaxis      Mouth/Throat:   MMM, no pharyngeal erythema. No peritonsillar mass.       Neck:   No meningismus. Full ROM.  No midline spinal tenderness Hematological/Lymphatic/Immunilogical:   No cervical lymphadenopathy. Cardiovascular:   RRR. Symmetric bilateral radial and DP pulses.  No murmurs. Cap refill less than 2 seconds. Respiratory:   Normal respiratory effort without tachypnea/retractions. Breath sounds are clear and equal bilaterally. No wheezes/rales/rhonchi. Gastrointestinal:   Soft and nontender. Non distended. There is no CVA tenderness.  No rebound, rigidity, or guarding.  Musculoskeletal: Tenderness of the left hand over the second and third metacarpals with ecchymosis and swelling.  No bony point tenderness or deformity. Tenderness over the proximal left humerus.  There is deformity of the left shoulder.  Clavicles intact, no AC joint tenderness Bilateral lower extremities unremarkable.  Right arm unremarkable.  Chest wall stable, pelvis stable. Neurologic:   Normal speech and language.  Motor grossly intact. No acute focal neurologic deficits are appreciated.  Skin:    Skin is warm, dry with skin tear left elbow without laceration.. No rash noted.  No petechiae, purpura, or bullae.  ____________________________________________    LABS (pertinent positives/negatives) (all labs ordered are listed, but only abnormal results are displayed) Labs Reviewed  BASIC METABOLIC PANEL - Abnormal; Notable for the following components:      Result Value   Glucose, Bld 185 (*)    Creatinine, Ser 1.08 (*)    GFR, Estimated 52 (*)    All other components within normal limits  CBC WITH DIFFERENTIAL/PLATELET - Abnormal; Notable for the following components:   RBC 3.40 (*)    Hemoglobin 11.2 (*)    HCT 34.3 (*)    MCV 100.9 (*)    Platelets 138 (*)    All other components  within normal limits  URINALYSIS, COMPLETE (UACMP) WITH MICROSCOPIC - Abnormal; Notable for the following components:   Color, Urine YELLOW (*)    APPearance CLEAR (*)    Ketones, ur 5 (*)    Leukocytes,Ua TRACE (*)    Bacteria, UA  RARE (*)    All other components within normal limits  SARS CORONAVIRUS 2 BY RT PCR (HOSPITAL ORDER, PERFORMED IN [facility])   ____________________________________________   EKG    ____________________________________________    RADIOLOGY  CT Head Wo Contrast  Result Date: 11/02/2020 CLINICAL DATA:  Fall. EXAM: CT HEAD WITHOUT CONTRAST CT CERVICAL SPINE WITHOUT CONTRAST TECHNIQUE: Multidetector CT imaging of the head and cervical spine was performed following the standard protocol without intravenous contrast. Multiplanar CT image reconstructions of the cervical spine were also generated. COMPARISON:  October 04, 2017.  October 09, 2016. FINDINGS: CT HEAD FINDINGS Brain: Mild diffuse cortical atrophy is noted. Mild chronic ischemic white matter disease is noted. No mass effect or midline shift is noted. Ventricular size is within normal limits. There is no evidence of mass lesion, hemorrhage or acute infarction. Vascular: No hyperdense vessel or unexpected calcification. Skull: Normal. Negative for fracture or focal lesion. Sinuses/Orbits: No acute finding. Other: None. CT CERVICAL SPINE FINDINGS Alignment: Normal. Skull base and vertebrae: No acute fracture. No primary bone lesion or focal pathologic process. Soft tissues and spinal canal: No prevertebral fluid or swelling. No visible canal hematoma. Disc levels: Status post surgical anterior fusion of C5-6. Moderate degenerative disc disease is noted at C4-5 and C6-7. Upper chest: Negative. Other: None. IMPRESSION: 1. Mild diffuse cortical atrophy. Mild chronic ischemic white matter disease. No acute intracranial abnormality seen. 2. Postsurgical and degenerative changes as described above. No acute  abnormality seen in the cervical spine. Electronically Signed   By: Lupita Raider M.D.   On: 11/02/2020 08:49   CT Cervical Spine Wo Contrast  Result Date: 11/02/2020 CLINICAL DATA:  Fall. EXAM: CT HEAD WITHOUT CONTRAST CT CERVICAL SPINE WITHOUT CONTRAST TECHNIQUE: Multidetector CT imaging of the head and cervical spine was performed following the standard protocol without intravenous contrast. Multiplanar CT image reconstructions of the cervical spine were also generated. COMPARISON:  October 04, 2017.  October 09, 2016. FINDINGS: CT HEAD FINDINGS Brain: Mild diffuse cortical atrophy is noted. Mild chronic ischemic white matter disease is noted. No mass effect or midline shift is noted. Ventricular size is within normal limits. There is no evidence of mass lesion, hemorrhage or acute infarction. Vascular: No hyperdense vessel or unexpected calcification. Skull: Normal. Negative for fracture or focal lesion. Sinuses/Orbits: No acute finding. Other: None. CT CERVICAL SPINE FINDINGS Alignment: Normal. Skull base and vertebrae: No acute fracture. No primary bone lesion or focal pathologic process. Soft tissues and spinal canal: No prevertebral fluid or swelling. No visible canal hematoma. Disc levels: Status post surgical anterior fusion of C5-6. Moderate degenerative disc disease is noted at C4-5 and C6-7. Upper chest: Negative. Other: None. IMPRESSION: 1. Mild diffuse cortical atrophy. Mild chronic ischemic white matter disease. No acute intracranial abnormality seen. 2. Postsurgical and degenerative changes as described above. No acute abnormality seen in the cervical spine. Electronically Signed   By: Lupita Raider M.D.   On: 11/02/2020 08:49   DG Shoulder Left  Result Date: 11/02/2020 CLINICAL DATA:  Status post fall with left shoulder pain. EXAM: LEFT SHOULDER - 2+ VIEW COMPARISON:  None. FINDINGS: There is a severely displaced fracture of the neck of the left humerus with superior displacement of the  distal fracture fragment. The acromioclavicular joint is not well visualized due to overlying bony structures, and therefore additional fractures cannot be excluded. There is soft tissue swelling. IMPRESSION: 1. Severely displaced fracture of the left humeral neck with superior displacement of  the distal fracture fragment. 2. Limited evaluation of the acromioclavicular joint due to overlying bony structures. Electronically Signed   By: Ted Mcalpine M.D.   On: 11/02/2020 09:19   DG Hand Complete Left  Result Date: 11/02/2020 CLINICAL DATA:  Fall.  Left hand swelling. EXAM: LEFT HAND - COMPLETE 3+ VIEW COMPARISON:  None. FINDINGS: Extensive soft tissue swelling is present over the dorsal aspect of the proximal phalanges. No underlying fracture is present. Degenerative changes are present throughout the wrist. Radial subluxation is present at the first [facility] joint. Ulnar deviation is present at the MCP joints. Osteoarthritic degenerative changes are present in the interphalangeal joints. IMPRESSION: 1. Extensive soft tissue swelling over the dorsal aspect of the proximal phalanges without underlying fracture. 2. Degenerative changes throughout the wrist and hand. Electronically Signed   By: Marin Roberts M.D.   On: 11/02/2020 10:04   DG Hip Unilat W or Wo Pelvis 2-3 Views Left  Result Date: 11/02/2020 CLINICAL DATA:  Left hip pain after fall. EXAM: DG HIP (WITH OR WITHOUT PELVIS) 2-3V LEFT COMPARISON:  October 09, 2016 FINDINGS: There is no evidence of hip fracture or dislocation. Mild-to-moderate osteoarthritic changes in bilateral hips. Foley catheter in place. IMPRESSION: No acute fracture or dislocation identified about the left hip. Electronically Signed   By: Ted Mcalpine M.D.   On: 11/02/2020 13:53    ____________________________________________   PROCEDURES Procedures  ____________________________________________  DIFFERENTIAL DIAGNOSIS   Intracranial hemorrhage, C-spine  fracture, left shoulder fracture, left shoulder dislocation, left hip fracture  CLINICAL IMPRESSION / ASSESSMENT AND PLAN / ED COURSE  Medications ordered in the ED: Medications  morphine 4 MG/ML injection 4 mg (4 mg Intravenous Given 11/02/20 0905)  ondansetron (ZOFRAN) injection 4 mg (4 mg Intravenous Given 11/02/20 0902)  Tdap (BOOSTRIX) injection 0.5 mL (0.5 mLs Intramuscular Given 11/02/20 0908)  morphine 4 MG/ML injection 4 mg (4 mg Intravenous Given 11/02/20 1153)    Pertinent labs & imaging results that were available during my care of the patient were reviewed by me and considered in my medical decision making (see chart for details).   DONALD JACQUE was evaluated in Emergency Department on 11/02/2020 for the symptoms described in the history of present illness. She was evaluated in the context of the global COVID-19 pandemic, which necessitated consideration that the patient might be at risk for infection with the SARS-CoV-2 virus that causes COVID-19. Institutional protocols and algorithms that pertain to the evaluation of patients at risk for COVID-19 are in a state of rapid change based on information released by regulatory bodies including the CDC and federal and state organizations. These policies and algorithms were followed during the patient's care in the ED.   Patient presents to the ED after mechanical fall at home getting out of bed.  Hand x-ray viewed and interpreted by me, unremarkable.  Left shoulder x-ray viewed by me, shows a displaced humeral neck fracture.  Injury discussed with Dr. Hyacinth Meeker as well who recommends conservative therapy with shoulder immobilizer for now.  CT scan head and neck radiology report reviewed, negative for acute injury such as intracranial hemorrhage.  We will check labs and if reassuring will discuss with family regarding follow-up care  ----------------------------------------- 2:12 PM on 11/02/2020 -----------------------------------------  X-ray  left hip also negative for any acute injury in the hip or pelvis.  Labs are reassuring.  Discussed with daughter at bedside who feels the patient is not manageable at home, she was barely able to assist the patient  with ADLs and toileting at home before, and now with the injury she will be unable to adequately assist the patient, and she is worried about safety.  I will request social work and physical therapy evaluations for possible rehab/SNF placement.      ____________________________________________   FINAL CLINICAL IMPRESSION(S) / ED DIAGNOSES    Final diagnoses:  Fracture of humerus neck, left, closed, initial encounter  Dementia without behavioral disturbance, unspecified dementia type (HCC)  Parkinson disease [facility])     ED Discharge Orders    None      Portions of this note were generated with dragon dictation software. Dictation errors may occur despite best attempts at proofreading.   Sharman Cheek, MD 11/02/20 1415

## 2020-11-02 NOTE — Progress Notes (Signed)
PT Cancellation Note  Patient Details Name: Carrie Glenn MRN: [id number] DOB: [birth year]   Cancelled Treatment:    Reason Eval/Treat Not Completed: Patient not medically ready PT orders received, chart reviewed. Pt with pending imaging ordered & no formal MD note in chart yet. Will f/u as pt is medically appropriate/cleared to participate.  Aleda Grana, PT, DPT 11/02/20, 1:38 PM    Sandi Mariscal 11/02/2020, 1:37 PM

## 2020-11-02 NOTE — ED Notes (Signed)
Pt taken for scans 

## 2020-11-02 NOTE — [place] FL2 (Signed)
[place] MEDICAID FL2 LEVEL OF CARE SCREENING TOOL     IDENTIFICATION  Patient Name: Carrie Glenn Birthdate: 1941/09/05 Sex: female Admission Date (Current Location): 11/02/2020  [place] and [place] Number:  Chiropodist and Address:  [facility], [address] [place], [place] [address]      Provider Number: [id number]  Attending Physician Name and Address:  Sharman Cheek, MD  Relative Name and Phone Number:  Germain Osgood, daughter, [phone]    Current Level of Care: SNF Recommended Level of Care: Skilled Nursing Facility Prior Approval Number:    Date Approved/Denied:   PASRR Number: [id number]  Discharge Plan: SNF    Current Diagnoses: Patient Active Problem List   Diagnosis Date Noted  . Gastro-esophageal reflux disease without esophagitis 09/19/2015  . Back pain, thoracic 09/19/2015  . Degeneration of intervertebral disc of lumbar region 09/04/2015  . Mixed incontinence 06/15/2015  . Anxiety 06/10/2015  . Controlled type 2 diabetes mellitus without complication (HCC) 06/10/2015  . HCAP (healthcare-associated pneumonia) 04/09/2015  . Acute encephalopathy 04/09/2015  . Frequent falls 04/09/2015  . Hypokalemia 04/09/2015  . HTN (hypertension) 04/07/2015  . Diabetes (HCC) 04/07/2015  . Acute renal failure (HCC) 03/09/2015  . Sepsis (HCC) 03/08/2015  . Acute UTI 03/08/2015  . Hypotension 03/08/2015  . Weakness generalized 03/08/2015  . Chronic back pain 03/08/2015  . 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 Type 2 diabetes mellitus (HCC) 11/28/2014  . Primary osteoarthritis of both knees 10/15/2014  . Pain in shoulder 08/15/2014  . 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 Degenerative arthritis of lumbar spine 08/15/2014  . Awareness of heartbeats 07/10/2014  . Difficult or painful urination 05/23/2014  . Excessive urination at night 01/31/2014    Orientation RESPIRATION BLADDER  Height & Weight     Self (Fluctuating)  Normal Incontinent Weight: 112 lb 14.4 oz (51.2 kg) Height:  5\' 6"  (167.6 cm)  BEHAVIORAL SYMPTOMS/MOOD NEUROLOGICAL BOWEL NUTRITION STATUS      Continent Diet  AMBULATORY STATUS COMMUNICATION OF NEEDS Skin   Extensive Assist Verbally Bruising                       Personal Care Assistance Level of Assistance  Bathing,Feeding,Dressing,Total care Bathing Assistance: Maximum assistance Feeding assistance: Limited assistance Dressing Assistance: Maximum assistance Total Care Assistance: Limited assistance   Functional Limitations Info  Sight,Hearing,Speech Sight Info: Adequate (uses hearing aids) Hearing Info: Impaired (uses hearing aids) Speech Info: Adequate    SPECIAL CARE FACTORS FREQUENCY  PT (By licensed PT),OT (By licensed OT)     PT Frequency: 5x OT Frequency: 3x            Contractures      Additional Factors Info  Code Status,Allergies Code Status Info: Full code Allergies Info: Amlodipine, Erythromycin, Lexapro (Escitalopram Oxalate), Lipitor (Atorvastatin), Macrodantin (Nitrofurantoin Macrocrystal), Nitrofurantoin, Sertraline Hcl, Trazodone And Nefazodone, Amoxicillin, Azithromycin, Effexor (Venlafaxine), Metformin And Related, Penicillins           Current Medications (11/02/2020):  This is the current hospital active medication list Current Facility-Administered Medications  Medication Dose Route Frequency Provider Last Rate Last Admin  . aspirin EC tablet 81 mg  81 mg Oral QPC supper 12/31/2020, MD      . Sharman Cheek ON 11/03/2020] calcium-vitamin D (OSCAL WITH D) 500-200 MG-UNIT per tablet   Oral Daily 01/01/2021, MD      . Carbidopa-Levodopa ER (SINEMET CR) 25-100 MG tablet controlled release  1 tablet  1 tablet Oral BID Sharman Cheek, MD      . cholecalciferol (VITAMIN D3) tablet 2,000 Units  2,000 Units Oral Daily Sharman Cheek, MD      . docusate sodium (COLACE) capsule 100 mg  100 mg Oral  BID Sharman Cheek, MD      . felodipine (PLENDIL) 24 hr tablet 5 mg  5 mg Oral Daily Sharman Cheek, MD      . Melene Muller ON 11/03/2020] fluticasone (FLONASE) 50 MCG/ACT nasal spray 1 spray  1 spray Each Nare BID Sharman Cheek, MD      . gabapentin (NEURONTIN) capsule 100 mg  100 mg Oral QHS Sharman Cheek, MD      . HYDROcodone-acetaminophen [facility]) 7.5-325 MG per tablet 1.5 tablet  1.5 tablet Oral BID PRN Sharman Cheek, MD   1.5 tablet at 11/02/20 1530  . irbesartan (AVAPRO) tablet 150 mg  150 mg Oral Daily Sharman Cheek, MD      . Melene Muller ON 11/03/2020] iron polysaccharides (NIFEREX) capsule 150 mg  150 mg Oral Daily Sharman Cheek, MD      . linagliptin (TRADJENTA) tablet 5 mg  5 mg Oral Daily Sharman Cheek, MD      . memantine [facility]) tablet 10 mg  10 mg Oral BID Sharman Cheek, MD      . mirtazapine (REMERON) tablet 7.5 mg  7.5 mg Oral QHS Sharman Cheek, MD      . multivitamin with minerals tablet 1 tablet  1 tablet Oral Daily Sharman Cheek, MD      . oxybutynin (DITROPAN-XL) 24 hr tablet 10 mg  10 mg Oral Daily Sharman Cheek, MD      . pantoprazole (PROTONIX) EC tablet 40 mg  40 mg Oral Daily Sharman Cheek, MD      . spironolactone (ALDACTONE) tablet 50 mg  50 mg Oral BID Sharman Cheek, MD       Current Outpatient Medications  Medication Sig Dispense Refill  . ALPRAZolam (XANAX) 0.5 MG tablet Take 0.5 mg by mouth 2 (two) times daily as needed for anxiety or sleep.    Marland Kitchen aspirin EC 81 MG tablet Take 81 mg by mouth daily after supper.     . Calcium Carbonate-Vitamin D3 600-400 MG-UNIT TABS Take 1 tablet by mouth daily.    . Carbidopa-Levodopa ER (SINEMET CR) 25-100 MG tablet controlled release Take 1 tablet by mouth 2 (two) times daily.    . Cholecalciferol (VITAMIN D) 50 MCG (2000 UT) CAPS Take 2,000 Units by mouth daily.    . cyclobenzaprine (FLEXERIL) 5 MG tablet Take 5 mg by mouth at bedtime as needed for muscle spasms.    Marland Kitchen docusate sodium  (COLACE) 100 MG capsule Take 100 mg by mouth 2 (two) times daily.    . felodipine (PLENDIL) 5 MG 24 hr tablet Take 5 mg by mouth daily.    . fluticasone (FLONASE) 50 MCG/ACT nasal spray Place 1 spray into both nostrils 2 (two) times daily.    Marland Kitchen gabapentin (NEURONTIN) 100 MG capsule Take 100 mg by mouth at bedtime.    Marland Kitchen HYDROcodone-acetaminophen (NORCO) 7.5-325 MG tablet Take 1.5 tablets by mouth 2 (two) times daily as needed for moderate pain.    . iron polysaccharides (NIFEREX) 150 MG capsule Take 150 mg by mouth daily.    . memantine (NAMENDA) 10 MG tablet Take 10 mg by mouth 2 (two) times daily.    . mirtazapine (REMERON) 7.5 MG tablet Take 7.5 mg by mouth at bedtime.    Marland Kitchen  Multiple Vitamin (MULTIVITAMIN WITH MINERALS) TABS tablet Take 1 tablet by mouth daily.    Marland Kitchen olmesartan (BENICAR) 20 MG tablet Take 20 mg by mouth 2 (two) times daily.    Marland Kitchen omeprazole (PRILOSEC) 20 MG capsule Take 20 mg by mouth 2 (two) times daily.    Marland Kitchen oxybutynin (DITROPAN-XL) 10 MG 24 hr tablet Take 10 mg by mouth daily.    . rosuvastatin (CRESTOR) 5 MG tablet Take 5 mg by mouth daily.    . sertraline (ZOLOFT) 100 MG tablet Take 100 mg by mouth daily.    . sitaGLIPtin (JANUVIA) 25 MG tablet Take 25 mg by mouth daily after supper.    Marland Kitchen spironolactone (ALDACTONE) 50 MG tablet Take 50 mg by mouth 2 (two) times daily.       Discharge Medications: Please see discharge summary for a list of discharge medications.  Relevant Imaging Results:  Relevant Lab Results:   Additional Information SS# [id number]  Larwance Rote, LCSW

## 2020-11-02 NOTE — ED Notes (Signed)
Daughter at bedside.

## 2020-11-02 NOTE — ED Notes (Signed)
Patient family at bedside. Denies other needs at this time.

## 2020-11-02 NOTE — TOC Initial Note (Signed)
Transition of Care [facility]) - Initial/Assessment Note    Patient Details  Name: Carrie Glenn MRN: [id number] Date of Birth: [birth year]  [facility] of [facility]) CM/SW Contact:    Berenice Bouton, LCSW Phone Number: 11/02/2020, 5:48 PM  Clinical Narrative:    CSW met with patient at bedside to discussed discharge plan. Social worker explained to the patient reason for the consult. CSW explain HIPPA.  Daughter, Baldo Daub, is present (at patient's bedside) and able to participate and answer questions.   Patient is an 80 year old Caucasian female who presented to [facility] ED via EMS from home after due to a fall. Patient presents with history of dementia, Parkinson's, and several other health issues. Patient lives at home with her daughter who is also her caretaker.   Patient needs assistance with all ADLs. Patient is on room air.   CSW discussed PT recommendation for discharge to skills nursing facility.   CSW educated patient on the process of searching for skills nursing facility. Provided patient with list of Medicare certified nursing home.  Plan: SW will begin bed search and continue to follow this patient.               Expected Discharge Plan: Skilled Nursing Facility Barriers to Discharge: Continued Medical Work up   Patient Goals and CMS Choice Patient states their goals for this hospitalization and ongoing recovery are:: Daughter wants patinet to get strong "so she could come home. I need her to use her arms." CMS Medicare.gov Compare Post Acute Care list provided to:: Patient Represenative (must comment) Choice offered to / list presented to : Adult Children  Expected Discharge Plan and Services Expected Discharge Plan: [place] arrangements for the past 2 months: Single Family Home                              Prior Living Arrangements/Services Living arrangements for the past 2 months: Single Family Home Lives with:: Adult Children  (Daughter and daughter's boyfriend) Patient language and need for interpreter reviewed:: No Do you feel safe going back to the place where you live?: Yes      Need for Family Participation in Patient Care: Yes (Comment) Care giver support system in place?: Yes (comment) (Has a nurse that comes in x2 a week)   Criminal Activity/Legal Involvement Pertinent to Current Situation/Hospitalization: No - Comment as needed  Activities of Daily Living      Permission Sought/Granted Permission sought to share information with : [place] granted to share information with : Yes, Verbal Permission Granted  Share Information with NAME: Baldo Daub, [phone]  Permission granted to share info w AGENCY: [place] granted to share info w Relationship: Daughter and grandchildren     Emotional Assessment Appearance:: Appears stated age Attitude/Demeanor/Rapport: Inconsistent,Lethargic Affect (typically observed): Agitated,Afraid/Fearful Orientation: : Oriented to Self,Fluctuating Orientation (Suspected and/or reported Sundowners) Alcohol / Substance Use: Not Applicable Psych Involvement: No (comment)  Admission diagnosis:  Fall ems Patient Active Problem List   Diagnosis Date Noted  . Gastro-esophageal reflux disease without esophagitis 09/19/2015  . Back pain, thoracic 09/19/2015  . Degeneration of intervertebral disc of lumbar region 09/04/2015  . Mixed incontinence 06/15/2015  . Anxiety 06/10/2015  . Controlled type 2 diabetes mellitus without complication ([place]) 38/25/0539  . HCAP (healthcare-associated pneumonia) 04/09/2015  . Acute encephalopathy 04/09/2015  . Frequent falls  04/09/2015  . Hypokalemia 04/09/2015  . HTN (hypertension) 04/07/2015  . Diabetes ([place]) 04/07/2015  . Acute renal failure ([place]) 03/09/2015  . Sepsis ([place]) 03/08/2015  . Acute UTI 03/08/2015  . Hypotension 03/08/2015  .  Weakness generalized 03/08/2015  . Chronic back pain 03/08/2015  . 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 Type 2 diabetes mellitus ([place]) 11/28/2014  . Primary osteoarthritis of both knees 10/15/2014  . Pain in shoulder 08/15/2014  . 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 Degenerative arthritis of lumbar spine 08/15/2014  . Awareness of heartbeats 07/10/2014  . Difficult or painful urination 05/23/2014  . Excessive urination at night 01/31/2014   PCP:  [facility]:   [place], [place] - [address] [place] [address] Phone: [phone] Fax: [phone]     Social Determinants of Health (SDOH) Interventions    Readmission Risk Interventions No flowsheet data found.

## 2020-11-02 NOTE — Evaluation (Signed)
Physical Therapy Evaluation Patient Details Name: Carrie Glenn MRN: [id number] DOB: [birth year] Today's Date: 11/02/2020   History of Present Illness  Carrie Glenn is a 80 y.o. female with a history of dementia, Parkinson's disease, diabetes, hypertension who is brought to the ED after a fall this morning.  Patient was getting out of bed, tripped over a rug and fell onto her left side.  Complains of pain in the left hand and left shoulder.  Was given 50 mcg of fentanyl IV by EMS.  Denies headache or neck pain, no paresthesias or motor weakness.  No other acute complaints. Left shoulder x-ray viewed by me, shows a displaced humeral neck fracture.  Injury discussed with Dr. Hyacinth Meeker as well who recommends conservative therapy with shoulder immobilizer for now.    Clinical Impression  MD contacted PT requesting for pt to be seen. Per notes, orthopedic MD consulted & recommending conservative management with LUE immobilizer (donned upon PT arrival).   Pt seen for PT evaluation with daughter Carrie Glenn) present & providing PLOF & home set up information. Prior to admission pt was ambulatory with rollator & supervision & utilized rails for stair negotiation into the home. Mobility during evaluation limited by pt's [facility] & impaired vision. Pt requires total assist to complete supine<>sit & does not engage despite manual facilitation & tactile cuing. Pt with rigid BLE with daughter noting pt has Parkinson's dementia. Pt would benefit from STR upon d/c from acute setting to maximize independence & reduce caregiver burden prior to return home.   Pt's daughter voicing concerns re: conservative management vs surgical management of LUE fx - PT notified attending MD via secure chat & encouraged Carrie Glenn to speak with MD re: all medical concerns.     Follow Up Recommendations SNF    Equipment Recommendations  Other (comment) (TBD in next venue)    Recommendations for Other Services OT consult     Precautions /  Restrictions Precautions Precautions: Fall Required Braces or Orthoses: Other Brace Other Brace: LUE immobilizer Restrictions Weight Bearing Restrictions: Yes LUE Weight Bearing: Non weight bearing      Mobility  Bed Mobility Overal bed mobility: Needs Assistance Bed Mobility: Supine to Sit;Sit to Supine     Supine to sit: Total assist;HOB elevated Sit to supine: Total assist;HOB elevated   General bed mobility comments: Pt unable to initiate nor participate despite tactile cuing & PT manually facilitating.    Transfers                    Ambulation/Gait                Stairs            Wheelchair Mobility    Modified Rankin (Stroke Patients Only)       Balance Overall balance assessment: Needs assistance Sitting-balance support: No upper extremity supported;Feet unsupported Sitting balance-Leahy Scale: Zero   Postural control: Posterior lean                                   Pertinent Vitals/Pain Pain Assessment: Faces Faces Pain Scale: Hurts little more ("It hurts too much" but pt unabe to state where) Pain Location: did not state location nor description of pain Pain Intervention(s): Monitored during session    Home Living Family/patient expects to be discharged to:: Private residence Living Arrangements: Children Available Help at Discharge: Family Type of Home: House Home Access:  Stairs to enter Entrance Stairs-Rails: Right Entrance Stairs-Number of Steps: [phone] Home Layout: One level Home Equipment:  (rollator)      Prior Function           Comments: Pt's daughter Carrie Glenn) reports pt was ambulatory with rollator with supervision, utilized rails/grab bars to negotiate stairs into house. 2 falls in past 6 months.     Hand Dominance        Extremity/Trunk Assessment   Upper Extremity Assessment Upper Extremity Assessment: Generalized weakness (LUE in immobilizer, NWB maintained & no ROM performed. Pt with  significant purple brusing to L UE (some brusing on RUE also))    Lower Extremity Assessment Lower Extremity Assessment: Generalized weakness;RLE deficits/detail;LLE deficits/detail RLE Deficits / Details: Pt maintains LLE plantarflexion with PT attempting to perform PROM to neutral with only minimal movement noted, able to achieve neutral R ankle. Dgtr reports pt has parskinson's dementia & is able to place L foot flat on floor with cuing when wearing shoe. BLE rigid. LLE Deficits / Details: Pt maintains LLE plantarflexion with PT attempting to perform PROM to neutral with only minimal movement noted, able to achieve neutral R ankle. Dgtr reports pt has parskinson's dementia & is able to place L foot flat on floor with cuing when wearing shoe. BLE rigid.       Communication   Communication: HOH (Pt HOH & unable to wear aide in L ear 2/2 swelling & no aide for R ear. Legally blind. Unable to successfully communicate with PT despite increased volume. Daughter assisted during session.)  Cognition Arousal/Alertness: Awake/alert Behavior During Therapy: Flat affect Overall Cognitive Status: Difficult to assess                                 General Comments: Difficult to assess 2/2 impaired vision/hearing.      General Comments      Exercises     Assessment/Plan    PT Assessment Patient needs continued PT services  PT Problem List Decreased strength;Decreased mobility;Decreased safety awareness;Decreased range of motion;Decreased activity tolerance;Decreased skin integrity;Pain;Decreased knowledge of use of DME;Decreased balance       PT Treatment Interventions DME instruction;Therapeutic exercise;Wheelchair mobility training;Gait training;Balance training;Manual techniques;Stair training;Neuromuscular re-education;Modalities;Functional mobility training;Cognitive remediation;Therapeutic activities;Patient/family education    PT Goals (Current goals can be found in the  Care Plan section)  Acute Rehab PT Goals Patient Stated Goal: go to rehab PT Goal Formulation: With family Time For Goal Achievement: 11/16/20 Potential to Achieve Goals: Fair    Frequency 7X/week   Barriers to discharge Decreased caregiver support;Inaccessible home environment daughter injured back when pt fell the first time & unable to physically care for pt (pt's daughter tearful/anxious during session with PT providing encouragement)    Co-evaluation               AM-PAC PT "6 Clicks" Mobility  Outcome Measure Help needed turning from your back to your side while in a flat bed without using bedrails?: Total Help needed moving from lying on your back to sitting on the side of a flat bed without using bedrails?: Total Help needed moving to and from a bed to a chair (including a wheelchair)?: Total Help needed standing up from a chair using your arms (e.g., wheelchair or bedside chair)?: Total Help needed to walk in hospital room?: Total Help needed climbing 3-5 steps with a railing? : Total 6 Click Score: 6    End  of Session   Activity Tolerance: Patient tolerated treatment well Patient left: in bed;with call bell/phone within reach;with family/visitor present   PT Visit Diagnosis: Difficulty in walking, not elsewhere classified (R26.2);Muscle weakness (generalized) (M62.81)    Time: 1557-1610 PT Time Calculation (min) (ACUTE ONLY): 13 min   Charges:   PT Evaluation $PT Eval Low Complexity: 1 Low          Aleda Grana, PT, DPT 11/02/20, 4:37 PM   Sandi Mariscal 11/02/2020, 4:35 PM

## 2020-11-02 NOTE — ED Triage Notes (Signed)
Pt arrives via EMS from home after she fell trying to get out of bed- pt family has CCTV in room and saw that she tripped on a rug and fell- pt denies LOC- pt has bruising to right hand and shoulder- pt has multiple skin tears to right arm and right ear- pt was given 50 mcg of fentanyl by EMS

## 2020-11-03 DIAGNOSIS — S42212A Unspecified displaced fracture of surgical neck of left humerus, initial encounter for closed fracture: Secondary | ICD-10-CM | POA: Diagnosis not present

## 2020-11-03 MED ORDER — ONDANSETRON 4 MG PO TBDP
4.0000 mg | ORAL_TABLET | Freq: Four times a day (QID) | ORAL | Status: DC | PRN
  Administered 2020-11-03: 4 mg via ORAL
  Filled 2020-11-03: qty 1

## 2020-11-03 MED ORDER — MORPHINE SULFATE (PF) 4 MG/ML IV SOLN
4.0000 mg | Freq: Once | INTRAVENOUS | Status: AC
Start: 2020-11-03 — End: 2020-11-03
  Administered 2020-11-03: 4 mg via INTRAVENOUS
  Filled 2020-11-03: qty 1

## 2020-11-03 MED ORDER — HYDROCODONE-ACETAMINOPHEN 5-325 MG PO TABS
1.0000 | ORAL_TABLET | Freq: Four times a day (QID) | ORAL | Status: DC | PRN
  Administered 2020-11-03: 1 via ORAL
  Filled 2020-11-03: qty 1

## 2020-11-03 NOTE — ED Notes (Signed)
Patient sleeping. No acute distress

## 2020-11-03 NOTE — ED Provider Notes (Signed)
Emergency Medicine Observation Re-evaluation Note  Carrie Glenn is a 80 y.o. female, seen on rounds today.  Pt initially presented to the ED for complaints of Fall Currently, the patient is resting comfortably.  Physical Exam  BP 134/77   Pulse 87   Temp 98 F (36.7 C) (Oral)   Resp 18   Ht 5\' 6"  (1.676 m)   Wt 51.2 kg   SpO2 97%   BMI 18.22 kg/m  Physical Exam Gen: Resting comfortably but complaining of left upper extremity pain Resp: Normal rise and fall of chest Neuro: Clear speech, intermittently confused Psych: Resting currently, calm and cooperative when awake EXT: Bruising and swelling noted to the proximal left upper extremity    ED Course / MDM  EKG:    I have reviewed the labs performed to date as well as medications administered while in observation.  Recent changes in the last 24 hours include no acute events overnight.  Plan  Current plan is for SNF placement. Patient is not under full IVC at this time.   Darwin Rothlisberger, , DO 11/03/20 01/01/21

## 2020-11-03 NOTE — ED Notes (Signed)
Daughter remains at bedside 

## 2020-11-03 NOTE — TOC Progression Note (Signed)
Transition of Care [facility]) - Progression Note    Patient Details  Name: Carrie Glenn MRN: [id number] Date of Birth: 1941/05/01  Transition of [facility]) CM/SW Contact  Marina Goodell Phone Number: [phone] 11/03/2020, 2:12 PM  Clinical Narrative:     CSW spoke with patient's Germain Osgood (Daughter) [phone] with update on SNF placement for patient.  CSW explained that I would need to expand the SNF search to the surrounding cities, and that preferred placement could not be guaranteed.  Ms. Eduardo Osier stated [facility] was not an option under any circumstances.  CSW stated I would update Ms.Gillian as soon as I received an update on placement.  Ms. Lehman Prom verbalized understanding.   Expected Discharge Plan: Skilled Nursing Facility Barriers to Discharge: Continued Medical Work up  Expected Discharge Plan and Services Expected Discharge Plan: Skilled Nursing Facility       Living arrangements for the past 2 months: Single Family Home                                       Social Determinants of Health (SDOH) Interventions    Readmission Risk Interventions No flowsheet data found.

## 2020-11-03 NOTE — ED Notes (Signed)
Assisted patient to eat food tray. Ate approximately 1/2 of sandwhich, drank apple juice and approximately 4 oz of water. Patient declined to eat any more. Patient made comfortable.

## 2020-11-03 NOTE — TOC Progression Note (Signed)
Transition of Care [facility]) - Progression Note    Patient Details  Name: JONICA BICKHART MRN: [id number] Date of Birth: [birth year]  Transition of Care [facility]) CM/SW Contact  Marina Goodell Phone Number:  [phone] 11/03/2020, 3:49 PM  Clinical Narrative:     CSW spoke with patient's Germain Osgood (Daughter) [phone] and updated her on the SNF that have accepted patient.  Ms. Lehman Prom stated she will look on the Medicare.gov website and we will touch base tomorrow on her decision for SNF placement.     Expected Discharge Plan: Skilled Nursing Facility Barriers to Discharge: Continued Medical Work up  Expected Discharge Plan and Services Expected Discharge Plan: Skilled Nursing Facility       Living arrangements for the past 2 months: Single Family Home                                       Social Determinants of Health (SDOH) Interventions    Readmission Risk Interventions No flowsheet data found.

## 2020-11-03 NOTE — ED Notes (Signed)
Patient calling out frequently. Very confused. Failed attemps to reorient patient. Dimmed lights so patient could rest. Has fidget blanket on lap.

## 2020-11-03 NOTE — ED Notes (Signed)
Pt c.o increased pain and appears anxious, Dr Katrinka Blazing notified and prn requested

## 2020-11-03 NOTE — ED Notes (Signed)
Patient requesting pain medication. Spoke with provider about clarifying PRN pain medication order. See new orders.

## 2020-11-03 NOTE — Evaluation (Signed)
Occupational Therapy Evaluation Patient Details Name: Carrie Glenn MRN: [id number] DOB: 1941/08/19 Today's Date: 11/03/2020    History of Present Illness Carrie Glenn is a 80 y.o. female with a history of dementia, Parkinson's disease, diabetes, hypertension who is brought to the ED after a fall this morning.  Patient was getting out of bed, tripped over a rug and fell onto her left side.  Complains of pain in the left hand and left shoulder.  Was given 50 mcg of fentanyl IV by EMS.  Denies headache or neck pain, no paresthesias or motor weakness.  No other acute complaints. Left shoulder x-ray viewed by me, shows a displaced humeral neck fracture.  Injury discussed with Dr. Hyacinth Meeker as well who recommends conservative therapy with shoulder immobilizer for now.   Clinical Impression   Patient presenting with decreased I in self care, balance, functional mobility/transfers, endurance, and safety awareness. Pt is legally blind but does appear to be tracking in the room. Pt is very hard of hearing and often unable to hear questions and commands given during evaluation. No family present during this session. However, family present during prior therapy evaluation and reports pt lives with daughter and uses a rollator PTA. Family assists with self care tasks as needed. Family performs IADL tasks. OT attempting max multimodal cuing with tactile cues utilized as well with pt ultimately needing total A to facilitate any task. Pt crying out when rolled or adjusted in bed with total A needed. L shoulder sling donned during session and pt making no attempt to move L UE. Pt very confused with tangential speech throughout. OT attempting to redirect but difficulty secondary to [facility]. Patient will benefit from acute OT to increase overall independence in the areas of ADLs, functional mobility, and safety awareness in order to safely discharge to next venue of care.    Follow Up Recommendations  SNF;Supervision/Assistance  - 24 hour    Equipment Recommendations  Other (comment) (defer to next venue of care)       Precautions / Restrictions Precautions Precautions: Fall Precaution Comments: legally blind Required Braces or Orthoses: Other Brace Other Brace: LUE immobilizer Restrictions Weight Bearing Restrictions: Yes LUE Weight Bearing: Non weight bearing      Mobility Bed Mobility Overal bed mobility: Needs Assistance Bed Mobility: Rolling Rolling: Total assist         General bed mobility comments: Pt unable to initiate/ follow commands with multimodal cuing    Transfers                 General transfer comment: deferred secondary to safety concerns        ADL either performed or assessed with clinical judgement   ADL Overall ADL's : Needs assistance/impaired                                       General ADL Comments: Pt overall requiring total A for all aspects of care. She is very hard of hearing and unable to follow and commands this session. Total A for bed mobility with pt crying out unsure if it is secondary to "surprise" or pain.     Vision Baseline Vision/History: Legally blind Patient Visual Report: No change from baseline Additional Comments: per chart, pt is legally blind but is observed to track in room.            Pertinent Vitals/Pain Pain Assessment: Faces  Faces Pain Scale: Hurts even more Pain Location: unable to verbalize Pain Descriptors / Indicators: Discomfort;Grimacing;Guarding Pain Intervention(s): Monitored during session;Limited activity within patient's tolerance;Repositioned     Hand Dominance Right   Extremity/Trunk Assessment Upper Extremity Assessment Upper Extremity Assessment: LUE deficits/detail LUE Deficits / Details: L displaced humeral neck fx visible with shoulder sling donned to immobilize LUE: Unable to fully assess due to immobilization   Lower Extremity Assessment Lower Extremity Assessment: Defer to PT  evaluation RLE Deficits / Details: Pt maintains LLE plantarflexion with PT attempting to perform PROM to neutral with only minimal movement noted, able to achieve neutral R ankle. Dgtr reports pt has parskinson's dementia & is able to place L foot flat on floor with cuing when wearing shoe. BLE rigid.       Communication Communication Communication: HOH   Cognition Arousal/Alertness: Awake/alert Behavior During Therapy: Flat affect Overall Cognitive Status: Difficult to assess                                 General Comments: Difficult to assess 2/2 impaired vision/hearing.              Home Living Family/patient expects to be discharged to:: Private residence Living Arrangements: Children   Type of Home: House Home Access: Stairs to enter Secretary/administrator of Steps: 3- 4 Entrance Stairs-Rails: Right Home Layout: One level               Home Equipment: Other (comment) (rollator)   Additional Comments: home information obtained via PT evaluation when daughter was present to confirm. No family present during OT evaluation.      Prior Functioning/Environment Level of Independence: Needs assistance  Gait / Transfers Assistance Needed: supervision with rollator for mobilityu ADL's / Homemaking Assistance Needed: daughter assists as needed            OT Problem List: Decreased strength;Decreased range of motion;Decreased activity tolerance;Impaired balance (sitting and/or standing);Decreased safety awareness;Pain;Decreased cognition;Impaired UE functional use;Decreased knowledge of use of DME or AE;Impaired vision/perception      OT Treatment/Interventions: Self-care/ADL training;Therapeutic exercise;Manual therapy;Energy conservation;DME and/or AE instruction;Cognitive remediation/compensation;Therapeutic activities;Balance training;Patient/family education    OT Goals(Current goals can be found in the care plan section) Acute Rehab OT  Goals Patient Stated Goal: unable to verbalize OT Goal Formulation: With patient Time For Goal Achievement: 11/17/20 Potential to Achieve Goals: Good ADL Goals Pt Will Perform Grooming: with min assist;standing Pt Will Perform Lower Body Dressing: with min assist;sit to/from stand Pt Will Transfer to Toilet: with min assist;ambulating;bedside commode Pt Will Perform Toileting - Clothing Manipulation and hygiene: with min assist;sit to/from stand  OT Frequency: Min 1X/week   Barriers to D/C:    unknown at this time          AM-PAC OT "6 Clicks" Daily Activity     Outcome Measure Help from another person eating meals?: Total Help from another person taking care of personal grooming?: Total Help from another person toileting, which includes using toliet, bedpan, or urinal?: Total Help from another person bathing (including washing, rinsing, drying)?: Total Help from another person to put on and taking off regular upper body clothing?: Total Help from another person to put on and taking off regular lower body clothing?: Total 6 Click Score: 6   End of Session Nurse Communication: Mobility status;Precautions  Activity Tolerance: Patient limited by pain Patient left: in bed;with nursing/sitter in room;with call bell/phone within reach  OT Visit Diagnosis: Unsteadiness on feet (R26.81);Muscle weakness (generalized) (M62.81);Repeated falls (R29.6);Pain Pain - Right/Left: Left Pain - part of body: Shoulder                Time: 1700-1749 OT Time Calculation (min): 14 min Charges:  OT General Charges $OT Visit: 1 Visit OT Evaluation $OT Eval High Complexity: 1 High  [address], [place], OTR/L , [facility] ascom [phone]  11/03/20, 12:05 PM

## 2020-11-03 NOTE — ED Notes (Signed)
Daughter at bedside.

## 2020-11-03 NOTE — ED Notes (Signed)
This RN assisted pt with eating breakfast

## 2020-11-03 NOTE — ED Notes (Signed)
With assistance of two RNs, patient transferred from ED stretcher to hospital bed for comfort. Patient placed in gown. Warm blankets provided. Patient provided with water. Resting comfortably with no acute distress noted. Denies other needs at this time.

## 2020-11-03 NOTE — Progress Notes (Signed)
Physical Therapy Treatment Patient Details Name: Carrie Glenn MRN: [id number] DOB: 1941/07/16 Today's Date: 11/03/2020    History of Present Illness Carrie Glenn is a 80 y.o. female with a history of dementia, Parkinson's disease, diabetes, hypertension who is brought to the ED after a fall this morning.  Patient was getting out of bed, tripped over a rug and fell onto her left side.  Complains of pain in the left hand and left shoulder.  Was given 50 mcg of fentanyl IV by EMS.  Denies headache or neck pain, no paresthesias or motor weakness.  No other acute complaints. Left shoulder x-ray viewed by me, shows a displaced humeral neck fracture.  Injury discussed with Dr. Hyacinth Meeker as well who recommends conservative therapy with shoulder immobilizer for now.    PT Comments    Pt seen for PT treatment. BP at beginning of session 129/75 mmHg in RUE (MAP 90). Session limited by pt's impaired vision (legally blind) & HOH. Pt requires total assist for supine<>sit & +2 to scoot to [facility]. Pt with absent righting reactions sitting EOB & slow posterior LOB without support from PT. Continue to recommend STR upon d/c to decrease caregiver burden & reduce fall risk prior to return home.     Follow Up Recommendations  SNF     Equipment Recommendations  None recommended by PT (TBD in next venue)    Recommendations for Other Services       Precautions / Restrictions Precautions Precautions: Fall Precaution Comments: legally blind Required Braces or Orthoses: Other Brace Other Brace: LUE immobilizer Restrictions Weight Bearing Restrictions: Yes LUE Weight Bearing: Non weight bearing    Mobility  Bed Mobility Overal bed mobility: Needs Assistance Bed Mobility: Supine to Sit;Sit to Supine     Supine to sit: Total assist;HOB elevated Sit to supine: Total assist;HOB elevated   General bed mobility comments: +2 to scoot to [facility]. Pt unable to follow multimodal cuing to assist with  supine<>sit.  Transfers                    Ambulation/Gait                 Stairs             Wheelchair Mobility    Modified Rankin (Stroke Patients Only)       Balance Overall balance assessment: Needs assistance Sitting-balance support: Feet supported;Single extremity supported Sitting balance-Leahy Scale: Zero Sitting balance - Comments: Slow posterior LOB, no righting reactions observed. Upon sitting EOB pt with strong lean onto RUE on bed, PT provides total assist to achieve upright posture & midline orientation. Postural control: Posterior lean                                  Cognition Arousal/Alertness: Awake/alert Behavior During Therapy: Flat affect Overall Cognitive Status: Difficult to assess                                 General Comments: Difficult to assess 2/2 impaired vision/hearing.      Exercises      General Comments General comments (skin integrity, edema, etc.): PT attempts to perform PROM to L ankle but minimal ROM noted.      Pertinent Vitals/Pain Pain Assessment: Faces Faces Pain Scale: No hurt    Home Living  Prior Function            PT Goals (current goals can now be found in the care plan section) Acute Rehab PT Goals Patient Stated Goal: unable to verbalize Progress towards PT goals: PT to reassess next treatment    Frequency    7X/week      PT Plan Current plan remains appropriate    Co-evaluation              AM-PAC PT "6 Clicks" Mobility   Outcome Measure  Help needed turning from your back to your side while in a flat bed without using bedrails?: Total Help needed moving from lying on your back to sitting on the side of a flat bed without using bedrails?: Total Help needed moving to and from a bed to a chair (including a wheelchair)?: Total Help needed standing up from a chair using your arms (e.g., wheelchair or bedside  chair)?: Total Help needed to walk in hospital room?: Total Help needed climbing 3-5 steps with a railing? : Total 6 Click Score: 6    End of Session Equipment Utilized During Treatment:  (LUE arm sling) Activity Tolerance: Patient tolerated treatment well Patient left: in bed;with call bell/phone within reach (unable to set bed alarm - nurse notified)   PT Visit Diagnosis: Difficulty in walking, not elsewhere classified (R26.2);Muscle weakness (generalized) (M62.81)     Time: 9381-0175 PT Time Calculation (min) (ACUTE ONLY): 11 min  Charges:  $Therapeutic Activity: 8-22 mins                     Aleda Grana, PT, DPT 11/03/20, 3:53 PM    Sandi Mariscal 11/03/2020, 3:51 PM

## 2020-11-04 ENCOUNTER — Encounter (HOSPITAL_COMMUNITY): Payer: Self-pay

## 2020-11-04 ENCOUNTER — Emergency Department (HOSPITAL_COMMUNITY): Payer: Medicare Other

## 2020-11-04 ENCOUNTER — Inpatient Hospital Stay (HOSPITAL_COMMUNITY)
Admission: EM | Admit: 2020-11-04 | Discharge: 2020-11-12 | DRG: 093 | Disposition: A | Discharge status: Home Health | Payer: Medicare Other | Source: Skilled Nursing Facility | Attending: Student | Admitting: Student

## 2020-11-04 DIAGNOSIS — S42212A Unspecified displaced fracture of surgical neck of left humerus, initial encounter for closed fracture: Secondary | ICD-10-CM | POA: Diagnosis not present

## 2020-11-04 DIAGNOSIS — F0280 Dementia in other diseases classified elsewhere without behavioral disturbance: Secondary | ICD-10-CM | POA: Diagnosis present

## 2020-11-04 DIAGNOSIS — Z833 Family history of diabetes mellitus: Secondary | ICD-10-CM

## 2020-11-04 DIAGNOSIS — Z880 Allergy status to penicillin: Secondary | ICD-10-CM

## 2020-11-04 DIAGNOSIS — E1122 Type 2 diabetes mellitus with diabetic chronic kidney disease: Secondary | ICD-10-CM | POA: Diagnosis present

## 2020-11-04 DIAGNOSIS — H9190 Unspecified hearing loss, unspecified ear: Secondary | ICD-10-CM | POA: Diagnosis present

## 2020-11-04 DIAGNOSIS — N1831 Chronic kidney disease, stage 3a: Secondary | ICD-10-CM | POA: Diagnosis present

## 2020-11-04 DIAGNOSIS — I129 Hypertensive chronic kidney disease with stage 1 through stage 4 chronic kidney disease, or unspecified chronic kidney disease: Secondary | ICD-10-CM | POA: Diagnosis present

## 2020-11-04 DIAGNOSIS — R4182 Altered mental status, unspecified: Secondary | ICD-10-CM

## 2020-11-04 DIAGNOSIS — I444 Left anterior fascicular block: Secondary | ICD-10-CM | POA: Diagnosis present

## 2020-11-04 DIAGNOSIS — Z20822 Contact with and (suspected) exposure to covid-19: Secondary | ICD-10-CM | POA: Diagnosis present

## 2020-11-04 DIAGNOSIS — Z79899 Other long term (current) drug therapy: Secondary | ICD-10-CM

## 2020-11-04 DIAGNOSIS — W19XXXD Unspecified fall, subsequent encounter: Secondary | ICD-10-CM | POA: Diagnosis present

## 2020-11-04 DIAGNOSIS — Z7982 Long term (current) use of aspirin: Secondary | ICD-10-CM

## 2020-11-04 DIAGNOSIS — R410 Disorientation, unspecified: Secondary | ICD-10-CM | POA: Diagnosis not present

## 2020-11-04 DIAGNOSIS — Z823 Family history of stroke: Secondary | ICD-10-CM

## 2020-11-04 DIAGNOSIS — G20 Parkinson's disease: Secondary | ICD-10-CM | POA: Diagnosis present

## 2020-11-04 DIAGNOSIS — G928 Other toxic encephalopathy: Secondary | ICD-10-CM | POA: Diagnosis not present

## 2020-11-04 DIAGNOSIS — I10 Essential (primary) hypertension: Secondary | ICD-10-CM | POA: Diagnosis present

## 2020-11-04 DIAGNOSIS — Z66 Do not resuscitate: Secondary | ICD-10-CM | POA: Diagnosis present

## 2020-11-04 DIAGNOSIS — Z881 Allergy status to other antibiotic agents status: Secondary | ICD-10-CM

## 2020-11-04 DIAGNOSIS — S42302D Unspecified fracture of shaft of humerus, left arm, subsequent encounter for fracture with routine healing: Secondary | ICD-10-CM

## 2020-11-04 DIAGNOSIS — M810 Age-related osteoporosis without current pathological fracture: Secondary | ICD-10-CM | POA: Diagnosis present

## 2020-11-04 DIAGNOSIS — R1312 Dysphagia, oropharyngeal phase: Secondary | ICD-10-CM | POA: Diagnosis present

## 2020-11-04 DIAGNOSIS — Z888 Allergy status to other drugs, medicaments and biological substances status: Secondary | ICD-10-CM

## 2020-11-04 DIAGNOSIS — G9341 Metabolic encephalopathy: Secondary | ICD-10-CM | POA: Diagnosis present

## 2020-11-04 DIAGNOSIS — Z7189 Other specified counseling: Secondary | ICD-10-CM

## 2020-11-04 DIAGNOSIS — D539 Nutritional anemia, unspecified: Secondary | ICD-10-CM | POA: Diagnosis present

## 2020-11-04 DIAGNOSIS — Z7984 Long term (current) use of oral hypoglycemic drugs: Secondary | ICD-10-CM

## 2020-11-04 DIAGNOSIS — Z89422 Acquired absence of other left toe(s): Secondary | ICD-10-CM

## 2020-11-04 DIAGNOSIS — G9340 Encephalopathy, unspecified: Secondary | ICD-10-CM | POA: Diagnosis present

## 2020-11-04 DIAGNOSIS — H547 Unspecified visual loss: Secondary | ICD-10-CM | POA: Diagnosis present

## 2020-11-04 DIAGNOSIS — E119 Type 2 diabetes mellitus without complications: Secondary | ICD-10-CM

## 2020-11-04 DIAGNOSIS — Y92129 Unspecified place in nursing home as the place of occurrence of the external cause: Secondary | ICD-10-CM

## 2020-11-04 DIAGNOSIS — Z825 Family history of asthma and other chronic lower respiratory diseases: Secondary | ICD-10-CM

## 2020-11-04 DIAGNOSIS — T50915A Adverse effect of multiple unspecified drugs, medicaments and biological substances, initial encounter: Secondary | ICD-10-CM | POA: Diagnosis present

## 2020-11-04 LAB — COMPREHENSIVE METABOLIC PANEL
ALT: 11 U/L (ref 0–44)
AST: 36 U/L (ref 15–41)
Albumin: 4.4 g/dL (ref 3.5–5.0)
Alkaline Phosphatase: 50 U/L (ref 38–126)
Anion gap: 10 (ref 5–15)
BUN: 19 mg/dL (ref 8–23)
CO2: 28 mmol/L (ref 22–32)
Calcium: 10.6 mg/dL — ABNORMAL HIGH (ref 8.9–10.3)
Chloride: 97 mmol/L — ABNORMAL LOW (ref 98–111)
Creatinine, Ser: 1.12 mg/dL — ABNORMAL HIGH (ref 0.44–1.00)
GFR, Estimated: 49 mL/min — ABNORMAL LOW (ref >60)
Glucose, Bld: 173 mg/dL — ABNORMAL HIGH (ref 70–99)
Potassium: 4.3 mmol/L (ref 3.5–5.1)
Sodium: 135 mmol/L (ref 135–145)
Total Bilirubin: 1 mg/dL (ref 0.3–1.2)
Total Protein: 7.7 g/dL (ref 6.5–8.1)

## 2020-11-04 LAB — URINALYSIS, ROUTINE W REFLEX MICROSCOPIC
Bilirubin Urine: NEGATIVE
Glucose, UA: NEGATIVE mg/dL
Hgb urine dipstick: NEGATIVE
Ketones, ur: 5 mg/dL — AB
Leukocytes,Ua: NEGATIVE
Nitrite: NEGATIVE
Protein, ur: NEGATIVE mg/dL
Specific Gravity, Urine: 1.013 (ref 1.005–1.030)
pH: 7 (ref 5.0–8.0)

## 2020-11-04 LAB — PROTIME-INR
INR: 1.1 (ref 0.8–1.2)
Prothrombin Time: 13.8 seconds (ref 11.4–15.2)

## 2020-11-04 LAB — CBC WITH DIFFERENTIAL/PLATELET
Abs Immature Granulocytes: 0.06 10*3/uL (ref 0.00–0.07)
Basophils Absolute: 0.1 10*3/uL (ref 0.0–0.1)
Basophils Relative: 0 %
Eosinophils Absolute: 0 10*3/uL (ref 0.0–0.5)
Eosinophils Relative: 0 %
HCT: 33.6 % — ABNORMAL LOW (ref 36.0–46.0)
Hemoglobin: 11.1 g/dL — ABNORMAL LOW (ref 12.0–15.0)
Immature Granulocytes: 1 %
Lymphocytes Relative: 12 %
Lymphs Abs: 1.3 10*3/uL (ref 0.7–4.0)
MCH: 33.4 pg (ref 26.0–34.0)
MCHC: 33 g/dL (ref 30.0–36.0)
MCV: 101.2 fL — ABNORMAL HIGH (ref 80.0–100.0)
Monocytes Absolute: 0.9 10*3/uL (ref 0.1–1.0)
Monocytes Relative: 8 %
Neutro Abs: 8.9 10*3/uL — ABNORMAL HIGH (ref 1.7–7.7)
Neutrophils Relative %: 79 %
Platelets: 146 10*3/uL — ABNORMAL LOW (ref 150–400)
RBC: 3.32 MIL/uL — ABNORMAL LOW (ref 3.87–5.11)
RDW: 12.9 % (ref 11.5–15.5)
WBC: 11.3 10*3/uL — ABNORMAL HIGH (ref 4.0–10.5)
nRBC: 0 % (ref 0.0–0.2)

## 2020-11-04 LAB — RESP PANEL BY RT-PCR (FLU A&B, COVID) ARPGX2
Influenza A by PCR: NEGATIVE
Influenza B by PCR: NEGATIVE
SARS Coronavirus 2 by RT PCR: NEGATIVE

## 2020-11-04 LAB — LACTIC ACID, PLASMA: Lactic Acid, Venous: 1.2 mmol/L (ref 0.5–1.9)

## 2020-11-04 LAB — POC SARS CORONAVIRUS 2 AG -  ED: SARS Coronavirus 2 Ag: NEGATIVE

## 2020-11-04 LAB — APTT: aPTT: 27 seconds (ref 24–36)

## 2020-11-04 MED ORDER — SODIUM CHLORIDE 0.9 % IV BOLUS
1000.0000 mL | Freq: Once | INTRAVENOUS | Status: AC
  Administered 2020-11-05: 1000 mL via INTRAVENOUS

## 2020-11-04 MED ORDER — SODIUM CHLORIDE 0.9 % IV BOLUS (SEPSIS)
500.0000 mL | Freq: Once | INTRAVENOUS | Status: AC
  Administered 2020-11-04: 500 mL via INTRAVENOUS

## 2020-11-04 NOTE — ED Notes (Signed)
Daughter at bedside. Breakfast tray provided. Offered to help pt eat but daughter stated she would help pt. Informed would be back with morning meds after eating.

## 2020-11-04 NOTE — ED Provider Notes (Signed)
Pt signed out to me by Augustine Radar, PA-C. Please see previous notes for further history.   In brief, patient fell 2 days ago.  Was diagnosed with a left shoulder fracture.  She was boarding in the ER for several days until today when she went to a SNF.  At 6:00 today, daughter found patient in a state of disarray at the SNF.  Patient has altered mental status from baseline, is hallucinating and seeing her grandkids who were not in the room.  Per daughter, this alteration of mental status is new as of today.  There is some question about what medications patient has been receiving that could be causing her delirium/altered mental status.  Work-up in the ER today overall reassuring, CT head without new or acute findings.  Will call for admission for delirium/altered mental status  Discussed with Dr. Toniann Fail from [facility] hospitalist service, pt to be admitted.    Alveria Apley, PA-C 11/05/20 0038    Gilda Crease, MD 11/05/20 [phone]

## 2020-11-04 NOTE — ED Notes (Signed)
Attempted to place an IV twice with no success.

## 2020-11-04 NOTE — ED Triage Notes (Signed)
Pt arrived via [facility] from [facility]. Pt had a fall about a week ago and sustained a left shoulder fracture. Pt was released to the facility today. Daughter feels like patient is not acting like herself in regards to mental status.   Temp: 100.2 tympanic BP: 122/86 HR: 105 CBG: 171 O2: 97% RA

## 2020-11-04 NOTE — TOC Progression Note (Signed)
Transition of Care [facility]) - Progression Note    Patient Details  Name: Carrie Glenn MRN: [id number] Date of Birth: 07-19-1941  Transition of [facility] CM/SW Contact  Marina Goodell Phone Number: [phone] 11/04/2020, 12:10 PM  Clinical Narrative:     Patient will d/c to [facility] Room# 114, report number [phone].  EDP and ED Staff updated.  Patient's daughter Germain Osgood (Daughter) [phone], updated. TOC consult complete.  Expected Discharge Plan: Skilled Nursing Facility Barriers to Discharge: Continued Medical Work up  Expected Discharge Plan and Services Expected Discharge Plan: Skilled Nursing Facility       Living arrangements for the past 2 months: Single Family Home                                       Social Determinants of Health (SDOH) Interventions    Readmission Risk Interventions No flowsheet data found.
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
## 2020-11-04 NOTE — TOC Progression Note (Signed)
Transition of Care [facility]) - Progression Note    Patient Details  Name: Carrie Glenn MRN: [id number] Date of Birth: September 18, 1941  [facility] CM/SW Contact  Marina Goodell Phone Number: [phone] 11/04/2020, 9:52 AM  Clinical Narrative:     CSW spoke with patient's Germain Osgood (Daughter) [phone], and updated her on SNF bed offers. Ms. Eduardo Osier chose [facility].  CSW reached out to [employer] and confirmed bed offer.  CSW requested rapid COVID test.  Discharge to SNF, pending negative COVID test results.  Expected Discharge Plan: Skilled Nursing Facility Barriers to Discharge: Continued Medical Work up  Expected Discharge Plan and Services Expected Discharge Plan: Skilled Nursing Facility       Living arrangements for the past 2 months: Single Family Home                                       Social Determinants of Health (SDOH) Interventions    Readmission Risk Interventions No flowsheet data found.

## 2020-11-04 NOTE — ED Notes (Signed)
Patient to CT.

## 2020-11-04 NOTE — ED Notes (Signed)
[facility]  CALLED  FOR  TRANSPORT  TO  [facility] 

## 2020-11-05 ENCOUNTER — Encounter (HOSPITAL_COMMUNITY): Payer: Self-pay | Admitting: Internal Medicine

## 2020-11-05 ENCOUNTER — Observation Stay (HOSPITAL_COMMUNITY): Payer: Medicare Other

## 2020-11-05 DIAGNOSIS — Z825 Family history of asthma and other chronic lower respiratory diseases: Secondary | ICD-10-CM | POA: Diagnosis not present

## 2020-11-05 DIAGNOSIS — Z66 Do not resuscitate: Secondary | ICD-10-CM | POA: Diagnosis present

## 2020-11-05 DIAGNOSIS — T50915A Adverse effect of multiple unspecified drugs, medicaments and biological substances, initial encounter: Secondary | ICD-10-CM | POA: Diagnosis present

## 2020-11-05 DIAGNOSIS — E1122 Type 2 diabetes mellitus with diabetic chronic kidney disease: Secondary | ICD-10-CM | POA: Diagnosis present

## 2020-11-05 DIAGNOSIS — W19XXXD Unspecified fall, subsequent encounter: Secondary | ICD-10-CM | POA: Diagnosis present

## 2020-11-05 DIAGNOSIS — Z833 Family history of diabetes mellitus: Secondary | ICD-10-CM | POA: Diagnosis not present

## 2020-11-05 DIAGNOSIS — F0280 Dementia in other diseases classified elsewhere without behavioral disturbance: Secondary | ICD-10-CM | POA: Diagnosis present

## 2020-11-05 DIAGNOSIS — R410 Disorientation, unspecified: Secondary | ICD-10-CM | POA: Diagnosis present

## 2020-11-05 DIAGNOSIS — F0390 Unspecified dementia without behavioral disturbance: Secondary | ICD-10-CM | POA: Diagnosis not present

## 2020-11-05 DIAGNOSIS — Z7984 Long term (current) use of oral hypoglycemic drugs: Secondary | ICD-10-CM | POA: Diagnosis not present

## 2020-11-05 DIAGNOSIS — S42302D Unspecified fracture of shaft of humerus, left arm, subsequent encounter for fracture with routine healing: Secondary | ICD-10-CM | POA: Diagnosis not present

## 2020-11-05 DIAGNOSIS — G9341 Metabolic encephalopathy: Secondary | ICD-10-CM | POA: Diagnosis not present

## 2020-11-05 DIAGNOSIS — Z888 Allergy status to other drugs, medicaments and biological substances status: Secondary | ICD-10-CM | POA: Diagnosis not present

## 2020-11-05 DIAGNOSIS — N1831 Chronic kidney disease, stage 3a: Secondary | ICD-10-CM | POA: Diagnosis present

## 2020-11-05 DIAGNOSIS — Z7982 Long term (current) use of aspirin: Secondary | ICD-10-CM | POA: Diagnosis not present

## 2020-11-05 DIAGNOSIS — Z880 Allergy status to penicillin: Secondary | ICD-10-CM | POA: Diagnosis not present

## 2020-11-05 DIAGNOSIS — Z89422 Acquired absence of other left toe(s): Secondary | ICD-10-CM | POA: Diagnosis not present

## 2020-11-05 DIAGNOSIS — E1165 Type 2 diabetes mellitus with hyperglycemia: Secondary | ICD-10-CM

## 2020-11-05 DIAGNOSIS — R9401 Abnormal electroencephalogram [EEG]: Secondary | ICD-10-CM | POA: Diagnosis not present

## 2020-11-05 DIAGNOSIS — Y92129 Unspecified place in nursing home as the place of occurrence of the external cause: Secondary | ICD-10-CM | POA: Diagnosis not present

## 2020-11-05 DIAGNOSIS — I10 Essential (primary) hypertension: Secondary | ICD-10-CM

## 2020-11-05 DIAGNOSIS — G928 Other toxic encephalopathy: Secondary | ICD-10-CM | POA: Diagnosis present

## 2020-11-05 DIAGNOSIS — Z20822 Contact with and (suspected) exposure to covid-19: Secondary | ICD-10-CM | POA: Diagnosis present

## 2020-11-05 DIAGNOSIS — H9190 Unspecified hearing loss, unspecified ear: Secondary | ICD-10-CM | POA: Diagnosis present

## 2020-11-05 DIAGNOSIS — G20 Parkinson's disease: Secondary | ICD-10-CM | POA: Diagnosis present

## 2020-11-05 DIAGNOSIS — I444 Left anterior fascicular block: Secondary | ICD-10-CM | POA: Diagnosis present

## 2020-11-05 DIAGNOSIS — R1312 Dysphagia, oropharyngeal phase: Secondary | ICD-10-CM | POA: Diagnosis present

## 2020-11-05 DIAGNOSIS — Z823 Family history of stroke: Secondary | ICD-10-CM | POA: Diagnosis not present

## 2020-11-05 DIAGNOSIS — Z881 Allergy status to other antibiotic agents status: Secondary | ICD-10-CM | POA: Diagnosis not present

## 2020-11-05 DIAGNOSIS — D539 Nutritional anemia, unspecified: Secondary | ICD-10-CM | POA: Diagnosis present

## 2020-11-05 DIAGNOSIS — Z79899 Other long term (current) drug therapy: Secondary | ICD-10-CM | POA: Diagnosis not present

## 2020-11-05 DIAGNOSIS — G9340 Encephalopathy, unspecified: Secondary | ICD-10-CM | POA: Diagnosis not present

## 2020-11-05 LAB — CBG MONITORING, ED
Glucose-Capillary: 156 mg/dL — ABNORMAL HIGH (ref 70–99)
Glucose-Capillary: 127 mg/dL — ABNORMAL HIGH (ref 70–99)
Glucose-Capillary: 146 mg/dL — ABNORMAL HIGH (ref 70–99)
Glucose-Capillary: 146 mg/dL — ABNORMAL HIGH (ref 70–99)
Glucose-Capillary: 139 mg/dL — ABNORMAL HIGH (ref 70–99)
Glucose-Capillary: 133 mg/dL — ABNORMAL HIGH (ref 70–99)

## 2020-11-05 LAB — COMPREHENSIVE METABOLIC PANEL
ALT: 13 U/L (ref 0–44)
AST: 37 U/L (ref 15–41)
Albumin: 3.9 g/dL (ref 3.5–5.0)
Alkaline Phosphatase: 43 U/L (ref 38–126)
Anion gap: 10 (ref 5–15)
BUN: 16 mg/dL (ref 8–23)
CO2: 26 mmol/L (ref 22–32)
Calcium: 9.3 mg/dL (ref 8.9–10.3)
Chloride: 102 mmol/L (ref 98–111)
Creatinine, Ser: 0.94 mg/dL (ref 0.44–1.00)
GFR, Estimated: >60 mL/min (ref >60)
Glucose, Bld: 132 mg/dL — ABNORMAL HIGH (ref 70–99)
Potassium: 3.8 mmol/L (ref 3.5–5.1)
Sodium: 138 mmol/L (ref 135–145)
Total Bilirubin: 0.8 mg/dL (ref 0.3–1.2)
Total Protein: 6.8 g/dL (ref 6.5–8.1)

## 2020-11-05 LAB — CBC
HCT: 30.2 % — ABNORMAL LOW (ref 36.0–46.0)
Hemoglobin: 9.9 g/dL — ABNORMAL LOW (ref 12.0–15.0)
MCH: 33.4 pg (ref 26.0–34.0)
MCHC: 32.8 g/dL (ref 30.0–36.0)
MCV: 102 fL — ABNORMAL HIGH (ref 80.0–100.0)
Platelets: 139 10*3/uL — ABNORMAL LOW (ref 150–400)
RBC: 2.96 MIL/uL — ABNORMAL LOW (ref 3.87–5.11)
RDW: 12.8 % (ref 11.5–15.5)
WBC: 10.9 10*3/uL — ABNORMAL HIGH (ref 4.0–10.5)
nRBC: 0 % (ref 0.0–0.2)

## 2020-11-05 LAB — RAPID URINE DRUG SCREEN, HOSP PERFORMED
Amphetamines: NOT DETECTED
Barbiturates: NOT DETECTED
Benzodiazepines: NOT DETECTED
Cocaine: NOT DETECTED
Opiates: POSITIVE — AB
Tetrahydrocannabinol: NOT DETECTED

## 2020-11-05 LAB — CREATININE, SERUM
Creatinine, Ser: 1 mg/dL (ref 0.44–1.00)
GFR, Estimated: 57 mL/min — ABNORMAL LOW (ref >60)

## 2020-11-05 LAB — AMMONIA: Ammonia: 10 umol/L (ref 9–35)

## 2020-11-05 MED ORDER — ACETAMINOPHEN 325 MG PO TABS
650.0000 mg | ORAL_TABLET | Freq: Four times a day (QID) | ORAL | Status: DC | PRN
  Administered 2020-11-06 – 2020-11-11 (×8): 650 mg via ORAL
  Filled 2020-11-05 (×8): qty 2

## 2020-11-05 MED ORDER — HEPARIN SODIUM (PORCINE) 5000 UNIT/ML IJ SOLN
5000.0000 [IU] | Freq: Three times a day (TID) | INTRAMUSCULAR | Status: DC
  Administered 2020-11-05 – 2020-11-12 (×23): 5000 [IU] via SUBCUTANEOUS
  Filled 2020-11-05 (×23): qty 1

## 2020-11-05 MED ORDER — ACETAMINOPHEN 650 MG RE SUPP: 650.0000 mg | Freq: Four times a day (QID) | RECTAL | Status: DC | PRN

## 2020-11-05 MED ORDER — INSULIN ASPART 100 UNIT/ML ~~LOC~~ SOLN
0.0000 [IU] | SUBCUTANEOUS | Status: DC
  Administered 2020-11-05 – 2020-11-11 (×13): 1 [IU] via SUBCUTANEOUS
  Administered 2020-11-12: 2 [IU] via SUBCUTANEOUS
  Administered 2020-11-12: 1 [IU] via SUBCUTANEOUS
  Filled 2020-11-05: qty 0.06

## 2020-11-05 MED ORDER — SODIUM CHLORIDE 0.9 % IV SOLN: INTRAVENOUS | Status: AC

## 2020-11-05 NOTE — H&P (Addendum)
History and Physical    Carrie Glenn BSJ:[id number] DOB: 22-Aug-1941 DOA: 11/04/2020  PCP: [facility]  Patient coming from: Skilled nursing facility.  Chief Complaint: Altered mental status.  HPI: Carrie Glenn is a 80 y.o. female with history of dementia, chronic kidney disease stage III, diabetes mellitus, Parkinson's, hypertension was recently brought into the ER 2 days ago after patient had a fall and fractured her left shoulder.  Patient was eventually discharged back to skilled nursing facility patient was found to be acutely confused.  Was brought back to the ER.  ED Course: In the ER patient is confused and not following commands.  Prefers to see him on the right side.  CT head is unremarkable.  Labs show mild hypercalcemia chest x-ray UA is unremarkable Covid test was negative EKG shows normal sinus rhythm.  Patient admitted for further management of acute encephalopathy cause not clear.  Review of Systems: As per HPI, rest all negative.   Past Medical History:  Diagnosis Date  . Acute encephalopathy 04/09/2015  . Acute renal failure (HCC) 03/09/2015  . Acute UTI 03/08/2015  . Arthritis   . Awareness of heartbeats 07/10/2014  . Blind    Legally  . Chronic back pain 03/08/2015  . Controlled type 2 diabetes mellitus without complication (HCC) 06/10/2015  . Degeneration of intervertebral disc of lumbar region 09/04/2015  . Degenerative arthritis of lumbar spine 08/15/2014  . Diabetes (HCC) 04/07/2015  . Diabetes mellitus without complication (HCC)   . Difficult or painful urination 05/23/2014  . Excessive urination at night 01/31/2014  . Frequent falls 04/09/2015  . Gastro-esophageal reflux disease without esophagitis 09/19/2015  . Hard of hearing   . HCAP (healthcare-associated pneumonia) 04/09/2015  . HTN (hypertension) 04/07/2015  . Hypertension   . Hypotension 03/08/2015  . Osteoporosis   . Pain    Chronic back  . Retinitis pigmentosa of both eyes   . Type 2  diabetes mellitus (HCC) 11/28/2014    Past Surgical History:  Procedure Laterality Date  . HIATAL HERNIA REPAIR    . left foot surgery  Left    Hammer toe fixation-2nd toe  . TOE AMPUTATION Left    Left 4th toe, partial amputation due to wound not healing     reports that she has never smoked. She has never used smokeless tobacco. She reports that she does not drink alcohol and does not use drugs.  Allergies  Allergen Reactions  . Amlodipine Swelling  . Erythromycin Nausea Only  . Lexapro [Escitalopram Oxalate] Nausea Only  . Lipitor [Atorvastatin] Nausea And Vomiting  . Macrodantin [Nitrofurantoin Macrocrystal] Nausea Only  . Nitrofurantoin Nausea Only  . Sertraline Hcl Other (See Comments)    Patients gets jittery.  . Trazodone And Nefazodone Other (See Comments)    Patient gets insomnia.  . Amoxicillin Diarrhea, Nausea And Vomiting, Nausea Only and Rash  . Azithromycin Rash  . Effexor [Venlafaxine] Rash  . Metformin And Related Itching and Rash  . Penicillins Rash    Family History  Problem Relation Age of Onset  . Stroke Mother   . Diabetes Mother   . Emphysema Father   . Kidney disease Neg Hx   . Bladder Cancer Neg Hx     Prior to Admission medications   Medication Sig Start Date End Date Taking? Authorizing Provider  ALPRAZolam Prudy Feeler) 0.5 MG tablet Take 0.5 mg by mouth 2 (two) times daily as needed for anxiety or sleep.   Yes [provider]  aspirin EC 81 MG tablet Take 81 mg by mouth daily after supper.    Yes [provider]  Calcium Carbonate-Vitamin D3 600-400 MG-UNIT TABS Take 1 tablet by mouth daily.   Yes [provider]  Carbidopa-Levodopa ER (SINEMET CR) 25-100 MG tablet controlled release Take 1 tablet by mouth 2 (two) times daily.   Yes [provider]  Cholecalciferol (VITAMIN D) 50 MCG (2000 UT) CAPS Take 2,000 Units by mouth daily.   Yes [provider]  cyclobenzaprine (FLEXERIL) 5 MG tablet Take 5 mg  by mouth at bedtime as needed for muscle spasms.   Yes [provider]  docusate sodium (COLACE) 250 MG capsule Take 250 mg by mouth 2 (two) times daily.   Yes [provider]  felodipine (PLENDIL) 5 MG 24 hr tablet Take 5 mg by mouth daily.   Yes [provider]  fluticasone (FLONASE) 50 MCG/ACT nasal spray Place 1 spray into both nostrils daily as needed for allergies or rhinitis.   Yes [provider]  gabapentin (NEURONTIN) 100 MG capsule Take 100 mg by mouth at bedtime.   Yes [provider]  HYDROcodone-acetaminophen (NORCO) 7.5-325 MG tablet Take 1.5 tablets by mouth 2 (two) times daily as needed for moderate pain.   Yes [provider]  iron polysaccharides (NIFEREX) 150 MG capsule Take 150 mg by mouth daily.   Yes [provider]  memantine (NAMENDA) 10 MG tablet Take 10 mg by mouth 2 (two) times daily.   Yes [provider]  mirtazapine (REMERON) 7.5 MG tablet Take 7.5 mg by mouth at bedtime.   Yes [provider]  Multiple Vitamin (MULTIVITAMIN WITH MINERALS) TABS tablet Take 1 tablet by mouth daily.   Yes [provider]  Multiple Vitamins-Minerals (AIRBORNE PO) Take 1 packet by mouth daily.   Yes [provider]  olmesartan (BENICAR) 20 MG tablet Take 20 mg by mouth 2 (two) times daily.   Yes [provider]  omeprazole (PRILOSEC) 20 MG capsule Take 20 mg by mouth daily.   Yes [provider]  oxybutynin (DITROPAN-XL) 10 MG 24 hr tablet Take 10 mg by mouth daily.   Yes [provider]  rosuvastatin (CRESTOR) 5 MG tablet Take 5 mg by mouth daily.   Yes [provider]  sitaGLIPtin (JANUVIA) 25 MG tablet Take 25 mg by mouth daily after supper. 08/01/14  Yes [provider]  spironolactone (ALDACTONE) 50 MG tablet Take 25 mg by mouth See admin instructions. Takes 25 mg 2 times every other day   Yes [provider]    Physical  Exam: Constitutional: Moderately built and nourished. Vitals:   11/04/20 2210 11/04/20 2316 11/04/20 2317 11/05/20 0045  BP: (!) 167/92   (!) 155/92  Pulse: 97   90  Resp: 20   17  Temp: 98.4 F (36.9 C) 99.9 F (37.7 C) 99.9 F (37.7 C)   TempSrc: Oral Rectal Rectal   SpO2: 99%   98%   Eyes: Anicteric no pallor. ENMT: No discharge from the ears eyes nose or mouth. Neck: No mass felt.  No neck rigidity. Respiratory: No rhonchi or crepitations. Cardiovascular: S1-S2 heard. Abdomen: Soft nontender bowel sounds present. Musculoskeletal: No edema.  Left shoulder bruise with splint. Skin: No rash. Neurologic: Alert awake but confused does not follow commands moving all extremities. Psychiatric: Appears confused.   Labs on Admission: I have personally reviewed following labs and imaging studies  CBC: Recent Labs  Lab 11/02/20 [phone]  11/04/20 2200  WBC 7.8 11.3*  NEUTROABS 6.6 8.9*  HGB 11.2* 11.1*  HCT 34.3* 33.6*  MCV 100.9* 101.2*  PLT 138* 146*   Basic Metabolic Panel: Recent Labs  Lab 11/02/20 0928 11/04/20 2200  NA 138 135  K 4.2 4.3  CL 101 97*  CO2 27 28  GLUCOSE 185* 173*  BUN 17 19  CREATININE 1.08* 1.12*  CALCIUM 9.4 10.6*   GFR: Estimated Creatinine Clearance: 31.8 mL/min (A) (by C-G formula based on SCr of 1.12 mg/dL (H)). Liver Function Tests: Recent Labs  Lab 11/04/20 2200  AST 36  ALT 11  ALKPHOS 50  BILITOT 1.0  PROT 7.7  ALBUMIN 4.4   No results for input(s): LIPASE, AMYLASE in the last 168 hours. No results for input(s): AMMONIA in the last 168 hours. Coagulation Profile: Recent Labs  Lab 11/04/20 2200  INR 1.1   Cardiac Enzymes: No results for input(s): CKTOTAL, CKMB, CKMBINDEX, TROPONINI in the last 168 hours. BNP (last 3 results) No results for input(s): PROBNP in the last 8760 hours. HbA1C: No results for input(s): HGBA1C in the last 72 hours. CBG: Recent Labs  Lab 11/02/20 1517  GLUCAP 155*   Lipid Profile: No  results for input(s): CHOL, HDL, LDLCALC, TRIG, CHOLHDL, LDLDIRECT in the last 72 hours. Thyroid Function Tests: No results for input(s): TSH, T4TOTAL, FREET4, T3FREE, THYROIDAB in the last 72 hours. Anemia Panel: No results for input(s): VITAMINB12, FOLATE, FERRITIN, TIBC, IRON, RETICCTPCT in the last 72 hours. Urine analysis:    Component Value Date/Time   COLORURINE YELLOW 11/04/2020 2110   APPEARANCEUR CLEAR 11/04/2020 2110   APPEARANCEUR Clear 01/05/2016 1049   LABSPEC 1.013 11/04/2020 2110   LABSPEC 1.008 08/27/2013 1740   PHURINE 7.0 11/04/2020 2110   GLUCOSEU NEGATIVE 11/04/2020 2110   GLUCOSEU Negative 08/27/2013 1740   HGBUR NEGATIVE 11/04/2020 2110   BILIRUBINUR NEGATIVE 11/04/2020 2110   BILIRUBINUR Negative 01/05/2016 1049   BILIRUBINUR Negative 08/27/2013 1740   KETONESUR 5 (A) 11/04/2020 2110   PROTEINUR NEGATIVE 11/04/2020 2110   NITRITE NEGATIVE 11/04/2020 2110   LEUKOCYTESUR NEGATIVE 11/04/2020 2110   LEUKOCYTESUR Trace 08/27/2013 1740   Sepsis Labs: @LABRCNTIP (procalcitonin:4,lacticidven:4) ) Recent Results (from the past 240 hour(s))  SARS Coronavirus 2 by RT PCR (hospital order, performed in [facility] hospital lab) Nasopharyngeal Nasopharyngeal Swab     Status: None   Collection Time: 11/02/20  1:58 PM   Specimen: Nasopharyngeal Swab  Result Value Ref Range Status   SARS Coronavirus 2 NEGATIVE NEGATIVE Final    Comment: (NOTE) SARS-CoV-2 target nucleic acids are NOT DETECTED.  The SARS-CoV-2 RNA is generally detectable in upper and lower respiratory specimens during the acute phase of infection. The lowest concentration of SARS-CoV-2 viral copies this assay can detect is 250 copies / mL. A negative result does not preclude SARS-CoV-2 infection and should not be used as the sole basis for treatment or other patient management decisions.  A negative result may occur with improper specimen collection / handling, submission of specimen other than  nasopharyngeal swab, presence of viral mutation(s) within the areas targeted by this assay, and inadequate number of viral copies (<250 copies / mL). A negative result must be combined with clinical observations, patient history, and epidemiological information.  Fact Sheet for Patients:   12/31/20  Fact Sheet for Healthcare Providers: BoilerBrush.com.cy  This test is not yet approved or  cleared by the https://pope.com/ FDA and has been authorized for detection and/or diagnosis of SARS-CoV-2 by FDA  under an Emergency Use Authorization (EUA).  This EUA will remain in effect (meaning this test can be used) for the duration of the COVID-19 declaration under Section 564(b)(1) of the Act, 21 U.S.C. section 360bbb-3(b)(1), unless the authorization is terminated or revoked sooner.  Performed at [facility], [address] [place], [place] [address]   Resp Panel by RT-PCR (Flu A&B, Covid) Nasopharyngeal Swab     Status: None   Collection Time: 11/04/20  9:15 PM   Specimen: Nasopharyngeal Swab; Nasopharyngeal(NP) swabs in vial transport medium  Result Value Ref Range Status   SARS Coronavirus 2 by RT PCR NEGATIVE NEGATIVE Final    Comment: (NOTE) SARS-CoV-2 target nucleic acids are NOT DETECTED.  The SARS-CoV-2 RNA is generally detectable in upper respiratory specimens during the acute phase of infection. The lowest concentration of SARS-CoV-2 viral copies this assay can detect is 138 copies/mL. A negative result does not preclude SARS-Cov-2 infection and should not be used as the sole basis for treatment or other patient management decisions. A negative result may occur with  improper specimen collection/handling, submission of specimen other than nasopharyngeal swab, presence of viral mutation(s) within the areas targeted by this assay, and inadequate number of viral copies(<138 copies/mL). A negative result must be  combined with clinical observations, patient history, and epidemiological information. The expected result is Negative.  Fact Sheet for Patients:  BloggerCourse.com  Fact Sheet for Healthcare Providers:  SeriousBroker.it  This test is no t yet approved or cleared by the [place] FDA and  has been authorized for detection and/or diagnosis of SARS-CoV-2 by FDA under an Emergency Use Authorization (EUA). This EUA will remain  in effect (meaning this test can be used) for the duration of the COVID-19 declaration under Section 564(b)(1) of the Act, 21 U.S.C.section 360bbb-3(b)(1), unless the authorization is terminated  or revoked sooner.       Influenza A by PCR NEGATIVE NEGATIVE Final   Influenza B by PCR NEGATIVE NEGATIVE Final    Comment: (NOTE) The Xpert Xpress SARS-CoV-2/FLU/RSV plus assay is intended as an aid in the diagnosis of influenza from Nasopharyngeal swab specimens and should not be used as a sole basis for treatment. Nasal washings and aspirates are unacceptable for Xpert Xpress SARS-CoV-2/FLU/RSV testing.  Fact Sheet for Patients: BloggerCourse.com  Fact Sheet for Healthcare Providers: SeriousBroker.it  This test is not yet approved or cleared by the [place] FDA and has been authorized for detection and/or diagnosis of SARS-CoV-2 by FDA under an Emergency Use Authorization (EUA). This EUA will remain in effect (meaning this test can be used) for the duration of the COVID-19 declaration under Section 564(b)(1) of the Act, 21 U.S.C. section 360bbb-3(b)(1), unless the authorization is terminated or revoked.  Performed at [facility], [address], [place], [place] [address]      Radiological Exams on Admission: CT Head Wo Contrast  Result Date: 11/04/2020 CLINICAL DATA:  Mental status change EXAM: CT HEAD WITHOUT CONTRAST  TECHNIQUE: Contiguous axial images were obtained from the base of the skull through the vertex without intravenous contrast. COMPARISON:  None. FINDINGS: Brain: No evidence of acute territorial infarction, hemorrhage, hydrocephalus,extra-axial collection or mass lesion/mass effect. There is dilatation the ventricles and sulci consistent with age-related atrophy. Low-attenuation changes in the deep white matter consistent with small vessel ischemia. Vascular: No hyperdense vessel or unexpected calcification. Skull: The skull is intact. No fracture or focal lesion identified. Sinuses/Orbits: The visualized paranasal sinuses and mastoid air cells are clear.  The orbits and globes intact. Other: None IMPRESSION: No acute intracranial abnormality. Findings consistent with age related atrophy and chronic small vessel ischemia Electronically Signed   By: Jonna Clark M.D.   On: 11/04/2020 23:36   DG Chest Port 1 View  Result Date: 11/04/2020 CLINICAL DATA:  Fall 1 week ago with fractured shoulder EXAM: PORTABLE CHEST 1 VIEW COMPARISON:  Radiograph 10/04/2017, radiographs 11/02/2020 FINDINGS: Previously seen fracture left humeral neck is not well visualized on this radiograph given collimation of much of the left upper extremity. No other acute osseous abnormality is seen. Prior cervical fusion is incompletely assessed. Telemetry leads overlie the chest. Chronic hyperinflation and coarsened interstitial changes with bronchitic features. No consolidative process. No pneumothorax or effusion. No convincing features of edema. Large hiatal hernia unchanged from prior. Calcified tortuous aorta. Remaining cardiomediastinal contours are unremarkable. IMPRESSION: 1. Previously seen fracture left humeral neck is not well visualized on this radiograph given collimation of much of the left upper extremity. 2. No acute cardiopulmonary abnormality. 3. Large hiatal hernia. 4.  Aortic Atherosclerosis (ICD10-I70.0). Electronically Signed    By: Kreg Shropshire M.D.   On: 11/04/2020 21:26    EKG: Independently reviewed.  Normal sinus rhythm.  Computer is reading as A. fib but I cannot see the P waves.  Assessment/Plan Principal Problem:   Acute encephalopathy Active Problems:   HTN (hypertension)   Type 2 diabetes mellitus (HCC)    1. Acute encephalopathy -cause not clear.  Exact time of onset of patient's symptoms are not clear.  Since patient is preferring to see on the right side will get MRI brain.  Check ammonia levels.  We will keep patient n.p.o. and get a speech therapy evaluation since patient is unsafe to do anything at this time. 2. Diabetes mellitus type 2 we will keep patient on sliding scale coverage. 3. Mild hypercalcemia could be from dehydration gently hydrate. 4. History of Parkinson's disease will need to continue Sinemet once patient passes swallow. 5. History of dementia presently n.p.o. 6. Hypertension allow for permissive hypertension and will get MRI brain. 7. Chronic kidney disease stage III creatinine appears to be at baseline. 8. Recent left shoulder fracture on brace.  Addendum -discussed with patient's daughter and as per the patient's daughter patient was confused from November 03 2020 evening.  Patient's daughter also confirmed patient is a DNR.   DVT prophylaxis: Lovenox. Code Status: DNR.  Confirmed with patient's daughter. Family Communication: Patient's daughter. Disposition Plan: We will need rehab. Consults called: Speech therapist. Admission status: Observation.   Eduard Clos MD [facility] Pager [phone].  If 7PM-7AM, please contact night-coverage [email or web address] Password [email or web address]  11/05/2020, 1:09 AM

## 2020-11-05 NOTE — TOC Initial Note (Signed)
Transition of Care [facility]) - Initial/Assessment Note    Patient Details  Name: Carrie Glenn MRN: [id number] Date of Birth: 24-Jul-1941  Transition of [facility]) CM/SW Contact:    Lorri Frederick, LCSW Phone Number: 11/05/2020, 8:02 PM  Clinical Narrative: Pt unable to participate in assessment.  CSW spoke with daughter  [name] by phone.  Terri reports her mother was transferred from [facility] to [facility] yesterday.  Pt left ED in early afternoon, Terri did not arrive at [facility] until 6:15pm and found the situation completely unacceptable.  Her mother did not appear to have been cared for at all since arrival that afternoon, was wet and soiled, was crying out, upset, and confused.  The room was dark and cold, she did not appear to have been fed or to have been given her medications, and the staff was not helpful when she asked about all this.  Terri had a previous poor experience at another facility and does not want pt placed again, she will take her back home.  [facility] was in place prior to her initial [facility] ED visit on 2/6 and Terri would like to resume services with them.  Terri also has HH aide service through [facility] and they will assist.  Current equipment in home: Jalbert, rollator, shower chair, hospital bed.  They obtained a basic hospital bed through pt [employer] insurance several years ago, [name] would like a better bed if it would be possible to get one through pt medicare.  Pt is not vaccinated, had covid in December, and Terri would like vaccination if she can now have it.  CSW told Terri that the CSW from the floor will follow up with her.            Expected Discharge Plan: Home w Home Health Services Barriers to Discharge: Continued Medical Work up   Patient Goals and CMS Choice   CMS Medicare.gov Compare Post Acute Care list provided to::  (daughter wants to continue with [facility])    Expected Discharge Plan and Services Expected Discharge  Plan: Home w Home Health Services In-house Referral: Clinical Social Work   Post Acute Care Choice: Home Health Living arrangements for the past 2 months: Single Family Home                                      Prior Living Arrangements/Services Living arrangements for the past 2 months: Single Family Home Lives with:: Adult Children (daughter and daughter's husband) Patient language and need for interpreter reviewed:: No        Need for Family Participation in Patient Care: Yes (Comment) Care giver support system in place?: Yes (comment) Current home services: Home PT ([facility]) Criminal Activity/Legal Involvement Pertinent to Current Situation/Hospitalization: No - Comment as needed  Activities of Daily Living Home Assistive Devices/Equipment: Hearing aid,Morson (specify type),CBG Meter (bilateral hearing aids, front wheeled Corsino) ADL Screening (condition at time of admission) Patient's cognitive ability adequate to safely complete daily activities?: No Is the patient deaf or have difficulty hearing?: Yes (wears hearing aids) Does the patient have difficulty seeing, even when wearing glasses/contacts?: Yes (legally blind) Does the patient have difficulty concentrating, remembering, or making decisions?: Yes Patient able to express need for assistance with ADLs?: Yes Does the patient have difficulty dressing or bathing?: Yes Independently performs ADLs?: No Communication: Independent Dressing (OT): Needs assistance Is this  a change from baseline?: Pre-admission baseline Grooming: Needs assistance Is this a change from baseline?: Pre-admission baseline Feeding: Needs assistance Is this a change from baseline?: Pre-admission baseline Bathing: Needs assistance Is this a change from baseline?: Pre-admission baseline Toileting: Needs assistance Is this a change from baseline?: Pre-admission baseline In/Out Bed: Needs assistance Is this a change from baseline?:  Pre-admission baseline Walks in Home: Needs assistance Is this a change from baseline?: Pre-admission baseline Does the patient have difficulty walking or climbing stairs?: Yes (secondary to weakness and parkinson's) Weakness of Legs: Both Weakness of Arms/Hands: Left (recent left shoulder injury)  Permission Sought/Granted                  Emotional Assessment Appearance:: Appears stated age Attitude/Demeanor/Rapport: Unable to Assess Affect (typically observed): Unable to Assess   Alcohol / Substance Use: Not Applicable Psych Involvement: No (comment)  Admission diagnosis:  Acute encephalopathy [G93.40] Acute metabolic encephalopathy [G93.41] Patient Active Problem List   Diagnosis Date Noted  . Acute metabolic encephalopathy 11/05/2020  . Gastro-esophageal reflux disease without esophagitis 09/19/2015  . Back pain, thoracic 09/19/2015  . Degeneration of intervertebral disc of lumbar region 09/04/2015  . Mixed incontinence 06/15/2015  . Anxiety 06/10/2015  . Controlled type 2 diabetes mellitus without complication (HCC) 06/10/2015  . HCAP (healthcare-associated pneumonia) 04/09/2015  . Acute encephalopathy 04/09/2015  . Frequent falls 04/09/2015  . Hypokalemia 04/09/2015  . HTN (hypertension) 04/07/2015  . Diabetes (HCC) 04/07/2015  . Acute renal failure (HCC) 03/09/2015  . Sepsis (HCC) 03/08/2015  . Acute UTI 03/08/2015  . Hypotension 03/08/2015  . Weakness generalized 03/08/2015  . Chronic back pain 03/08/2015  . 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 Type 2 diabetes mellitus (HCC) 11/28/2014  . Primary osteoarthritis of both knees 10/15/2014  . Pain in shoulder 08/15/2014  . 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 Degenerative arthritis of lumbar spine 08/15/2014  . Awareness of heartbeats 07/10/2014  . Difficult or painful urination 05/23/2014  . Excessive urination at night 01/31/2014   PCP:  [facility] Pharmacy:    MEDICAL [address] [place], [place] - [address] [place] [address] Phone: [phone] Fax: [phone]     Social Determinants of Health (SDOH) Interventions    Readmission Risk Interventions No flowsheet data found.

## 2020-11-05 NOTE — Progress Notes (Signed)
[facility] PROGRESS NOTE   Carrie Glenn WIO:[id number] DOB: [birth year] DOA: 11/04/2020  PCP: [facility]  Brief History/Interval Summary: 80 y.o. female with history of dementia, chronic kidney disease stage III, diabetes mellitus, Parkinson's, hypertension was recently brought into the ER 2 days ago after patient had a fall and fractured her left shoulder.  Patient was eventually discharged back to skilled nursing facility. Patient was found to be acutely confused.  Was brought back to the ER.  Reason for Visit: Acute metabolic encephalopathy  Consultants: None  Procedures: None yet  Antibiotics: Anti-infectives (From admission, onward)   None      Subjective/Interval History: Patient very minimally responsive this morning.  She does open her eyes.  Does not answer any questions.     Assessment/Plan:  Acute metabolic encephalopathy Baseline mental status is not clearly known.  Reason for her current presentation is unclear.  UA was unremarkable.  Influenza and COVID-19 test were negative.  Chest x-ray did not show any pneumonia.  WBC is only minimally elevated.  CT head did not show any acute findings.  MRI brain has been ordered and is pending.  Will check ammonia level as well.  Consider EEG if MRI brain does not show any acute findings.  History of dementia Baseline mental status is not known.  History of Parkinson's disease Resume Sinemet once patient is safe to swallow.  Speech therapy consult has been ordered.  Diabetes mellitus type 2 Continue SSI.  Check HbA1c.  Monitor CBGs.  Chronic kidney disease stage IIIa Stable.  Monitor urine output.  Essential hypertension Holding her antihypertensives for now.  Recent left shoulder fracture Continue with shoulder immobilization.  Sling.  Pain control.  PT and OT evaluation.  Macrocytic anemia Drop in hemoglobin could be dilutional or from recent injury and bleeding associated with the injury.   No obvious other overt bleeding is noted.  We will check anemia panel if not done recently.  Goals of care Patient noted to be DNR.  Depending on her progress over the next 2 days further decisions may need to be made.  DVT Prophylaxis: Subcutaneous heparin Code Status: DNR Family Communication: No family at bedside.  Will discuss with her daughter later today Disposition Plan: Hopefully return back to her nursing facility when improved.  Status is: Observation  The patient will require care spanning > 2 midnights and should be moved to inpatient because: Altered mental status and IV treatments appropriate due to intensity of illness or inability to take PO  Dispo: The patient is from: SNF              Anticipated d/c is to: SNF              Anticipated d/c date is: 2 days              Patient currently is not medically stable to d/c.   Difficult to place patient No       Medications:  Scheduled: . heparin  5,000 Units Subcutaneous Q8H  . insulin aspart  0-6 Units Subcutaneous Q4H   Continuous: . sodium chloride 50 mL/hr at 11/05/20 0134   EQA:[id number] **OR** acetaminophen   Objective:  Vital Signs  Vitals:   11/05/20 0230 11/05/20 0245 11/05/20 0515 11/05/20 0645  BP: (!) 156/98 (!) 150/92 (!) 156/91 (!) 143/82  Pulse: 91 94 93 81  Resp: 19 19 14 12   Temp:      TempSrc:      SpO2:  100% 100% 100% 98%    Intake/Output Summary (Last 24 hours) at 11/05/2020 0940 Last data filed at 11/05/2020 6659 Gross per 24 hour  Intake 500 ml  Output --  Net 500 ml   There were no vitals filed for this visit.  General appearance: Somnolent but arousable.  Not very communicative. Resp: Clear to auscultation bilaterally.  Normal effort Cardio: S1-S2 is normal regular.  No S3-S4.  No rubs murmurs or bruit GI: Abdomen is soft.  Nontender nondistended.  Bowel sounds are present normal.  No masses organomegaly Extremities: Bruising noted over the left chest and left arm from  her recent fall.  Left upper extremity is in a sling. Neurologic: No obvious focal deficits noted.  Patient not very cooperative with examination.   Lab Results:  Data Reviewed: I have personally reviewed following labs and imaging studies  CBC: Recent Labs  Lab 11/02/20 0928 11/04/20 2200 11/05/20 0108  WBC 7.8 11.3* 10.9*  NEUTROABS 6.6 8.9*  --   HGB 11.2* 11.1* 9.9*  HCT 34.3* 33.6* 30.2*  MCV 100.9* 101.2* 102.0*  PLT 138* 146* 139*    Basic Metabolic Panel: Recent Labs  Lab 11/02/20 0928 11/04/20 2200 11/05/20 0108 11/05/20 0450  NA 138 135  --  138  K 4.2 4.3  --  3.8  CL 101 97*  --  102  CO2 27 28  --  26  GLUCOSE 185* 173*  --  132*  BUN 17 19  --  16  CREATININE 1.08* 1.12* 1.00 0.94  CALCIUM 9.4 10.6*  --  9.3    GFR: Estimated Creatinine Clearance: 37.9 mL/min (by C-G formula based on SCr of 0.94 mg/dL).  Liver Function Tests: Recent Labs  Lab 11/04/20 2200 11/05/20 0450  AST 36 37  ALT 11 13  ALKPHOS 50 43  BILITOT 1.0 0.8  PROT 7.7 6.8  ALBUMIN 4.4 3.9    Coagulation Profile: Recent Labs  Lab 11/04/20 2200  INR 1.1     CBG: Recent Labs  Lab 11/02/20 1517 11/05/20 0127 11/05/20 0450 11/05/20 0755  GLUCAP 155* 156* 127* 146*      Recent Results (from the past 240 hour(s))  SARS Coronavirus 2 by RT PCR (hospital order, performed in [facility] hospital lab) Nasopharyngeal Nasopharyngeal Swab     Status: None   Collection Time: 11/02/20  1:58 PM   Specimen: Nasopharyngeal Swab  Result Value Ref Range Status   SARS Coronavirus 2 NEGATIVE NEGATIVE Final    Comment: (NOTE) SARS-CoV-2 target nucleic acids are NOT DETECTED.  The SARS-CoV-2 RNA is generally detectable in upper and lower respiratory specimens during the acute phase of infection. The lowest concentration of SARS-CoV-2 viral copies this assay can detect is 250 copies / mL. A negative result does not preclude SARS-CoV-2 infection and should not be used as the  sole basis for treatment or other patient management decisions.  A negative result may occur with improper specimen collection / handling, submission of specimen other than nasopharyngeal swab, presence of viral mutation(s) within the areas targeted by this assay, and inadequate number of viral copies (<250 copies / mL). A negative result must be combined with clinical observations, patient history, and epidemiological information.  Fact Sheet for Patients:   BoilerBrush.com.cy  Fact Sheet for Healthcare Providers: https://pope.com/  This test is not yet approved or  cleared by the [place] FDA and has been authorized for detection and/or diagnosis of SARS-CoV-2 by FDA under an Emergency Use Authorization (EUA).  This EUA will remain in effect (meaning this test can be used) for the duration of the COVID-19 declaration under Section 564(b)(1) of the Act, 21 U.S.C. section 360bbb-3(b)(1), unless the authorization is terminated or revoked sooner.  Performed at [facility], [address] [place], [place] [address]   Resp Panel by RT-PCR (Flu A&B, Covid) Nasopharyngeal Swab     Status: None   Collection Time: 11/04/20  9:15 PM   Specimen: Nasopharyngeal Swab; Nasopharyngeal(NP) swabs in vial transport medium  Result Value Ref Range Status   SARS Coronavirus 2 by RT PCR NEGATIVE NEGATIVE Final    Comment: (NOTE) SARS-CoV-2 target nucleic acids are NOT DETECTED.  The SARS-CoV-2 RNA is generally detectable in upper respiratory specimens during the acute phase of infection. The lowest concentration of SARS-CoV-2 viral copies this assay can detect is 138 copies/mL. A negative result does not preclude SARS-Cov-2 infection and should not be used as the sole basis for treatment or other patient management decisions. A negative result may occur with  improper specimen collection/handling, submission of specimen other than  nasopharyngeal swab, presence of viral mutation(s) within the areas targeted by this assay, and inadequate number of viral copies(<138 copies/mL). A negative result must be combined with clinical observations, patient history, and epidemiological information. The expected result is Negative.  Fact Sheet for Patients:  BloggerCourse.com  Fact Sheet for Healthcare Providers:  SeriousBroker.it  This test is no t yet approved or cleared by the [place] FDA and  has been authorized for detection and/or diagnosis of SARS-CoV-2 by FDA under an Emergency Use Authorization (EUA). This EUA will remain  in effect (meaning this test can be used) for the duration of the COVID-19 declaration under Section 564(b)(1) of the Act, 21 U.S.C.section 360bbb-3(b)(1), unless the authorization is terminated  or revoked sooner.       Influenza A by PCR NEGATIVE NEGATIVE Final   Influenza B by PCR NEGATIVE NEGATIVE Final    Comment: (NOTE) The Xpert Xpress SARS-CoV-2/FLU/RSV plus assay is intended as an aid in the diagnosis of influenza from Nasopharyngeal swab specimens and should not be used as a sole basis for treatment. Nasal washings and aspirates are unacceptable for Xpert Xpress SARS-CoV-2/FLU/RSV testing.  Fact Sheet for Patients: BloggerCourse.com  Fact Sheet for Healthcare Providers: SeriousBroker.it  This test is not yet approved or cleared by the [place] FDA and has been authorized for detection and/or diagnosis of SARS-CoV-2 by FDA under an Emergency Use Authorization (EUA). This EUA will remain in effect (meaning this test can be used) for the duration of the COVID-19 declaration under Section 564(b)(1) of the Act, 21 U.S.C. section 360bbb-3(b)(1), unless the authorization is terminated or revoked.  Performed at [facility], [address] [place], [place] [address]       Radiology Studies: CT Head Wo Contrast  Result Date: 11/04/2020 CLINICAL DATA:  Mental status change EXAM: CT HEAD WITHOUT CONTRAST TECHNIQUE: Contiguous axial images were obtained from the base of the skull through the vertex without intravenous contrast. COMPARISON:  None. FINDINGS: Brain: No evidence of acute territorial infarction, hemorrhage, hydrocephalus,extra-axial collection or mass lesion/mass effect. There is dilatation the ventricles and sulci consistent with age-related atrophy. Low-attenuation changes in the deep white matter consistent with small vessel ischemia. Vascular: No hyperdense vessel or unexpected calcification. Skull: The skull is intact. No fracture or focal lesion identified. Sinuses/Orbits: The visualized paranasal sinuses and mastoid air cells are clear. The orbits and globes intact. Other: None IMPRESSION:  No acute intracranial abnormality. Findings consistent with age related atrophy and chronic small vessel ischemia Electronically Signed   By: Jonna Clark M.D.   On: 11/04/2020 23:36   DG Chest Port 1 View  Result Date: 11/04/2020 CLINICAL DATA:  Fall 1 week ago with fractured shoulder EXAM: PORTABLE CHEST 1 VIEW COMPARISON:  Radiograph 10/04/2017, radiographs 11/02/2020 FINDINGS: Previously seen fracture left humeral neck is not well visualized on this radiograph given collimation of much of the left upper extremity. No other acute osseous abnormality is seen. Prior cervical fusion is incompletely assessed. Telemetry leads overlie the chest. Chronic hyperinflation and coarsened interstitial changes with bronchitic features. No consolidative process. No pneumothorax or effusion. No convincing features of edema. Large hiatal hernia unchanged from prior. Calcified tortuous aorta. Remaining cardiomediastinal contours are unremarkable. IMPRESSION: 1. Previously seen fracture left humeral neck is not well visualized on this radiograph given  collimation of much of the left upper extremity. 2. No acute cardiopulmonary abnormality. 3. Large hiatal hernia. 4.  Aortic Atherosclerosis (ICD10-I70.0). Electronically Signed   By: Kreg Shropshire M.D.   On: 11/04/2020 21:26       LOS: 0 days   Nataniel Gasper Rito Ehrlich  [facility] Pager on [email or web address]  11/05/2020, 9:40 AM

## 2020-11-06 ENCOUNTER — Inpatient Hospital Stay (HOSPITAL_COMMUNITY)
Admit: 2020-11-06 | Discharge: 2020-11-06 | Disposition: A | Discharge status: Home / Self Care | Payer: Medicare Other | Attending: Internal Medicine | Admitting: Internal Medicine

## 2020-11-06 ENCOUNTER — Other Ambulatory Visit: Payer: Self-pay

## 2020-11-06 DIAGNOSIS — G9340 Encephalopathy, unspecified: Secondary | ICD-10-CM | POA: Diagnosis not present

## 2020-11-06 DIAGNOSIS — I10 Essential (primary) hypertension: Secondary | ICD-10-CM | POA: Diagnosis not present

## 2020-11-06 DIAGNOSIS — D539 Nutritional anemia, unspecified: Secondary | ICD-10-CM | POA: Diagnosis not present

## 2020-11-06 DIAGNOSIS — R9401 Abnormal electroencephalogram [EEG]: Secondary | ICD-10-CM

## 2020-11-06 DIAGNOSIS — E1165 Type 2 diabetes mellitus with hyperglycemia: Secondary | ICD-10-CM | POA: Diagnosis not present

## 2020-11-06 LAB — URINE CULTURE: Culture: <10000 — AB

## 2020-11-06 LAB — CBC
HCT: 27.5 % — ABNORMAL LOW (ref 36.0–46.0)
Hemoglobin: 8.7 g/dL — ABNORMAL LOW (ref 12.0–15.0)
MCH: 33.5 pg (ref 26.0–34.0)
MCHC: 31.6 g/dL (ref 30.0–36.0)
MCV: 105.8 fL — ABNORMAL HIGH (ref 80.0–100.0)
Platelets: 135 10*3/uL — ABNORMAL LOW (ref 150–400)
RBC: 2.6 MIL/uL — ABNORMAL LOW (ref 3.87–5.11)
RDW: 12.9 % (ref 11.5–15.5)
WBC: 6.7 10*3/uL (ref 4.0–10.5)
nRBC: 0 % (ref 0.0–0.2)

## 2020-11-06 LAB — GLUCOSE, CAPILLARY
Glucose-Capillary: 104 mg/dL — ABNORMAL HIGH (ref 70–99)
Glucose-Capillary: 106 mg/dL — ABNORMAL HIGH (ref 70–99)
Glucose-Capillary: 108 mg/dL — ABNORMAL HIGH (ref 70–99)
Glucose-Capillary: 117 mg/dL — ABNORMAL HIGH (ref 70–99)
Glucose-Capillary: 134 mg/dL — ABNORMAL HIGH (ref 70–99)
Glucose-Capillary: 137 mg/dL — ABNORMAL HIGH (ref 70–99)

## 2020-11-06 LAB — COMPREHENSIVE METABOLIC PANEL
ALT: 11 U/L (ref 0–44)
AST: 27 U/L (ref 15–41)
Albumin: 3.2 g/dL — ABNORMAL LOW (ref 3.5–5.0)
Alkaline Phosphatase: 35 U/L — ABNORMAL LOW (ref 38–126)
Anion gap: 11 (ref 5–15)
BUN: 19 mg/dL (ref 8–23)
CO2: 22 mmol/L (ref 22–32)
Calcium: 8.4 mg/dL — ABNORMAL LOW (ref 8.9–10.3)
Chloride: 103 mmol/L (ref 98–111)
Creatinine, Ser: 0.94 mg/dL (ref 0.44–1.00)
GFR, Estimated: >60 mL/min (ref >60)
Glucose, Bld: 137 mg/dL — ABNORMAL HIGH (ref 70–99)
Potassium: 3.5 mmol/L (ref 3.5–5.1)
Sodium: 136 mmol/L (ref 135–145)
Total Bilirubin: 0.7 mg/dL (ref 0.3–1.2)
Total Protein: 5.7 g/dL — ABNORMAL LOW (ref 6.5–8.1)

## 2020-11-06 LAB — RETICULOCYTES
Immature Retic Fract: 17.5 % — ABNORMAL HIGH (ref 2.3–15.9)
RBC.: 2.64 MIL/uL — ABNORMAL LOW (ref 3.87–5.11)
Retic Count, Absolute: 52.8 10*3/uL (ref 19.0–186.0)
Retic Ct Pct: 2 % (ref 0.4–3.1)

## 2020-11-06 LAB — VITAMIN B12: Vitamin B-12: 325 pg/mL (ref 180–914)

## 2020-11-06 LAB — TSH: TSH: 2.601 u[IU]/mL (ref 0.350–4.500)

## 2020-11-06 LAB — FOLATE: Folate: 35.3 ng/mL (ref >5.9)

## 2020-11-06 LAB — IRON AND TIBC
Iron: 41 ug/dL (ref 28–170)
Saturation Ratios: 18 % (ref 10.4–31.8)
TIBC: 224 ug/dL — ABNORMAL LOW (ref 250–450)
UIBC: 183 ug/dL

## 2020-11-06 LAB — CBG MONITORING, ED: Glucose-Capillary: 132 mg/dL — ABNORMAL HIGH (ref 70–99)

## 2020-11-06 LAB — FERRITIN: Ferritin: 62 ng/mL (ref 11–307)

## 2020-11-06 MED ORDER — LIP MEDEX EX OINT
TOPICAL_OINTMENT | CUTANEOUS | Status: AC
  Filled 2020-11-06: qty 7

## 2020-11-06 MED ORDER — CYANOCOBALAMIN 1000 MCG/ML IJ SOLN
1000.0000 ug | Freq: Once | INTRAMUSCULAR | Status: AC
  Administered 2020-11-06: 1000 ug via INTRAMUSCULAR
  Filled 2020-11-06: qty 1

## 2020-11-06 MED ORDER — POTASSIUM CHLORIDE 10 MEQ/100ML IV SOLN
10.0000 meq | INTRAVENOUS | Status: AC
  Administered 2020-11-06 (×2): 10 meq via INTRAVENOUS
  Filled 2020-11-06: qty 100

## 2020-11-06 MED ORDER — POTASSIUM CHLORIDE 20 MEQ PO PACK: 40.0000 meq | PACK | Freq: Once | ORAL | Status: DC

## 2020-11-06 NOTE — Progress Notes (Signed)
OT Cancellation Note  Patient Details Name: Carrie Glenn MRN: [id number] DOB: 17-Nov-1940   Cancelled Treatment:    Reason Eval/Treat Not Completed: Patient's level of consciousness;Fatigue/lethargy limiting ability to participate. Pt received with lethargy, unable to arouse with tactile stim. RN reports pt was awake until 3 am and is exhausted. Will continue to follow.   Marquette Old, MSOT, OTR/L  [facility]  [phone]   Zigmund Daniel 11/06/2020, 10:40 AM

## 2020-11-06 NOTE — Evaluation (Signed)
SLP Cancellation Note  Patient Details Name: RITHIKA SEEL MRN: [id number] DOB: Jan 21, 1941   Cancelled treatment:       Reason Eval/Treat Not Completed: Other (comment);Fatigue/lethargy limiting ability to participate (pt remains too lethargic for po administration per RN, will follow up next date)  Rolena Infante, MS [facility] SLP Acute Rehab Services Office [phone] Pager [phone]   Chales Abrahams 11/06/2020, 1:53 PM

## 2020-11-06 NOTE — Progress Notes (Signed)
[facility] PROGRESS NOTE   Carrie Glenn:[id number] DOB: 1940-10-25 DOA: 11/04/2020  PCP: [facility]  Brief History/Interval Summary: 80 y.o. female with history of dementia, chronic kidney disease stage III, diabetes mellitus, Parkinson's, hypertension was recently brought into the ER 2 days ago after patient had a fall and fractured her left shoulder.  Patient was eventually discharged back to skilled nursing facility. Patient was found to be acutely confused.  Was brought back to the ER.  Reason for Visit: Acute metabolic encephalopathy  Consultants: None  Procedures: None yet  Antibiotics: Anti-infectives (From admission, onward)   None      Subjective/Interval History: Patient remains minimally responsive.  She does open her eyes but closes it back immediately.  Does not answer any questions.  Does not follow commands.     Assessment/Plan:  Acute metabolic encephalopathy Baseline mental status is not clearly known.  Reason for her current presentation is unclear.  UA was unremarkable.  Influenza and COVID-19 test were negative.  Chest x-ray did not show any pneumonia.  CT head did not show any acute findings.  MRI brain negative for acute stroke.  TSH normal at 2.6.  B12 low normal at 325.  Folate levels normal.  Ammonia level was normal at 10.  EEG is pending.  Daughter did report that when the patient fell few days ago she might have hit her head against a box.  Quite possible that she may have had a concussion.  History of dementia Apparently she is somewhat communicative at baseline.  History of Parkinson's disease Resume Sinemet once patient is safe to swallow.  Speech therapy consult has been ordered.  Diabetes mellitus type 2 Continue SSI.  Check HbA1c.  Monitor CBGs.  Chronic kidney disease stage IIIa Stable.  Monitor urine output.  Essential hypertension Holding her antihypertensives for now.  Occasional high readings noted.  Recent  left shoulder fracture Continue with shoulder immobilization.  Sling.  Pain control.  PT and OT evaluation.  Macrocytic anemia Drop in hemoglobin is likely dilutional with some mild bleeding associated with her recent injury.  No obvious overt bleeding noted.  Anemia panel reviewed.  No clear-cut deficiencies identified.  B12 was low normal.  May benefit from supplementation.  We will give her 1 B12 injection here in the hospital.    Goals of care Patient noted to be DNR. Will need to see how she does over the next few days before deciding further course of action.  If there is no improvement and if oral intake remains poor then may involve palliative care to assist with goals of care.  DVT Prophylaxis: Subcutaneous heparin Code Status: DNR Family Communication: Daughter was updated yesterday.  Do so again today. Disposition Plan: Hopefully return back to her nursing facility when improved.  Status is: Inpatient  Remains inpatient appropriate because:Altered mental status   Dispo:  Patient From: Skilled Nursing Facility  Planned Disposition: Skilled Nursing Facility  Expected discharge date: 11/08/2020  Medically stable for discharge: No        Medications:  Scheduled: . heparin  5,000 Units Subcutaneous Q8H  . insulin aspart  0-6 Units Subcutaneous Q4H  . potassium chloride  40 mEq Oral Once   Continuous: . sodium chloride 50 mL/hr at 11/05/20 0134   PJA:[id number] **OR** acetaminophen   Objective:  Vital Signs  Vitals:   11/06/20 0215 11/06/20 0327 11/06/20 0327 11/06/20 1125  BP: 114/73  (!) 142/83 (!) 154/90  Pulse: 61  78 74  Resp: 16  16 16   Temp: 99.1 F (37.3 C)  98.2 F (36.8 C) 98.3 F (36.8 C)  TempSrc:   Oral Oral  SpO2: 96%  97% 100%  Weight:  48.4 kg    Height:  5\' 7"  (1.702 m)      Intake/Output Summary (Last 24 hours) at 11/06/2020 1248 Last data filed at 11/06/2020 0930 Gross per 24 hour  Intake 1240 ml  Output --  Net 1240 ml    Filed Weights   11/06/20 0327  Weight: 48.4 kg    General appearance: Remains somnolent. Resp: Clear to auscultation bilaterally.  Normal effort Cardio: S1-S2 is normal regular.  No S3-S4.  No rubs murmurs or bruit GI: Abdomen is soft.  Nontender nondistended.  Bowel sounds are present normal.  No masses organomegaly Extremities: Left upper extremity in a sling.  Bruising noted over the left chest and left upper arm area. Neurologic: No focal neurological deficits.    Lab Results:  Data Reviewed: I have personally reviewed following labs and imaging studies  CBC: Recent Labs  Lab 11/02/20 0928 11/04/20 2200 11/05/20 0108 11/06/20 0608  WBC 7.8 11.3* 10.9* 6.7  NEUTROABS 6.6 8.9*  --   --   HGB 11.2* 11.1* 9.9* 8.7*  HCT 34.3* 33.6* 30.2* 27.5*  MCV 100.9* 101.2* 102.0* 105.8*  PLT 138* 146* 139* 135*    Basic Metabolic Panel: Recent Labs  Lab 11/02/20 0928 11/04/20 2200 11/05/20 0108 11/05/20 0450 11/06/20 0608  NA 138 135  --  138 136  K 4.2 4.3  --  3.8 3.5  CL 101 97*  --  102 103  CO2 27 28  --  26 22  GLUCOSE 185* 173*  --  132* 137*  BUN 17 19  --  16 19  CREATININE 1.08* 1.12* 1.00 0.94 0.94  CALCIUM 9.4 10.6*  --  9.3 8.4*    GFR: Estimated Creatinine Clearance: 35.9 mL/min (by C-G formula based on SCr of 0.94 mg/dL).  Liver Function Tests: Recent Labs  Lab 11/04/20 2200 11/05/20 0450 11/06/20 0608  AST 36 37 27  ALT 11 13 11   ALKPHOS 50 43 35*  BILITOT 1.0 0.8 0.7  PROT 7.7 6.8 5.7*  ALBUMIN 4.4 3.9 3.2*    Coagulation Profile: Recent Labs  Lab 11/04/20 2200  INR 1.1     CBG: Recent Labs  Lab 11/05/20 2129 11/06/20 0122 11/06/20 0335 11/06/20 0735 11/06/20 1206  GLUCAP 133* 132* 137* 108* 106*      Recent Results (from the past 240 hour(s))  SARS Coronavirus 2 by RT PCR (hospital order, performed in [facility] hospital lab) Nasopharyngeal Nasopharyngeal Swab     Status: None   Collection Time: 11/02/20  1:58 PM    Specimen: Nasopharyngeal Swab  Result Value Ref Range Status   SARS Coronavirus 2 NEGATIVE NEGATIVE Final    Comment: (NOTE) SARS-CoV-2 target nucleic acids are NOT DETECTED.  The SARS-CoV-2 RNA is generally detectable in upper and lower respiratory specimens during the acute phase of infection. The lowest concentration of SARS-CoV-2 viral copies this assay can detect is 250 copies / mL. A negative result does not preclude SARS-CoV-2 infection and should not be used as the sole basis for treatment or other patient management decisions.  A negative result may occur with improper specimen collection / handling, submission of specimen other than nasopharyngeal swab, presence of viral mutation(s) within the areas targeted by this assay, and inadequate number of viral copies (<250 copies /  mL). A negative result must be combined with clinical observations, patient history, and epidemiological information.  Fact Sheet for Patients:   BoilerBrush.com.cy  Fact Sheet for Healthcare Providers: https://pope.com/  This test is not yet approved or  cleared by the [place] FDA and has been authorized for detection and/or diagnosis of SARS-CoV-2 by FDA under an Emergency Use Authorization (EUA).  This EUA will remain in effect (meaning this test can be used) for the duration of the COVID-19 declaration under Section 564(b)(1) of the Act, 21 U.S.C. section 360bbb-3(b)(1), unless the authorization is terminated or revoked sooner.  Performed at [facility], [address] [place], [place] [address]   Urine culture     Status: None (Preliminary result)   Collection Time: 11/04/20  9:10 PM   Specimen: In/Out Cath Urine  Result Value Ref Range Status   Specimen Description   Final    IN/OUT CATH URINE Performed at [facility], [address], [place], [place] [address]    Special Requests   Final     NONE Performed at [facility], [address], [place], [place] [address]    Culture   Final    CULTURE REINCUBATED FOR BETTER GROWTH Performed at [facility], [address], [place], [place] [address]    Report Status PENDING  Incomplete  Resp Panel by RT-PCR (Flu A&B, Covid) Nasopharyngeal Swab     Status: None   Collection Time: 11/04/20  9:15 PM   Specimen: Nasopharyngeal Swab; Nasopharyngeal(NP) swabs in vial transport medium  Result Value Ref Range Status   SARS Coronavirus 2 by RT PCR NEGATIVE NEGATIVE Final    Comment: (NOTE) SARS-CoV-2 target nucleic acids are NOT DETECTED.  The SARS-CoV-2 RNA is generally detectable in upper respiratory specimens during the acute phase of infection. The lowest concentration of SARS-CoV-2 viral copies this assay can detect is 138 copies/mL. A negative result does not preclude SARS-Cov-2 infection and should not be used as the sole basis for treatment or other patient management decisions. A negative result may occur with  improper specimen collection/handling, submission of specimen other than nasopharyngeal swab, presence of viral mutation(s) within the areas targeted by this assay, and inadequate number of viral copies(<138 copies/mL). A negative result must be combined with clinical observations, patient history, and epidemiological information. The expected result is Negative.  Fact Sheet for Patients:  BloggerCourse.com  Fact Sheet for Healthcare Providers:  SeriousBroker.it  This test is no t yet approved or cleared by the [place] FDA and  has been authorized for detection and/or diagnosis of SARS-CoV-2 by FDA under an Emergency Use Authorization (EUA). This EUA will remain  in effect (meaning this test can be used) for the duration of the COVID-19 declaration under Section 564(b)(1) of the Act, 21 U.S.C.section 360bbb-3(b)(1), unless the  authorization is terminated  or revoked sooner.       Influenza A by PCR NEGATIVE NEGATIVE Final   Influenza B by PCR NEGATIVE NEGATIVE Final    Comment: (NOTE) The Xpert Xpress SARS-CoV-2/FLU/RSV plus assay is intended as an aid in the diagnosis of influenza from Nasopharyngeal swab specimens and should not be used as a sole basis for treatment. Nasal washings and aspirates are unacceptable for Xpert Xpress SARS-CoV-2/FLU/RSV testing.  Fact Sheet for Patients: BloggerCourse.com  Fact Sheet for Healthcare Providers: SeriousBroker.it  This test is not yet approved or cleared by the [place] FDA and has been authorized for detection and/or diagnosis of  SARS-CoV-2 by FDA under an Emergency Use Authorization (EUA). This EUA will remain in effect (meaning this test can be used) for the duration of the COVID-19 declaration under Section 564(b)(1) of the Act, 21 U.S.C. section 360bbb-3(b)(1), unless the authorization is terminated or revoked.  Performed at [facility], [address], [place], [place] [address]   Blood culture (routine single)     Status: None (Preliminary result)   Collection Time: 11/04/20 10:00 PM   Specimen: BLOOD  Result Value Ref Range Status   Specimen Description   Final    BLOOD RIGHT ANTECUBITAL Performed at [facility], [address] [place], [place] [address]    Special Requests   Final    BOTTLES DRAWN AEROBIC AND ANAEROBIC Blood Culture adequate volume Performed at [facility], [address], [place], [place] [address]    Culture   Final    NO GROWTH 1 DAY Performed at [facility], [address] [place], [place] [address]    Report Status PENDING  Incomplete      Radiology Studies: CT Head Wo Contrast  Result Date: 11/04/2020 CLINICAL DATA:  Mental status change EXAM: CT HEAD WITHOUT CONTRAST TECHNIQUE: Contiguous  axial images were obtained from the base of the skull through the vertex without intravenous contrast. COMPARISON:  None. FINDINGS: Brain: No evidence of acute territorial infarction, hemorrhage, hydrocephalus,extra-axial collection or mass lesion/mass effect. There is dilatation the ventricles and sulci consistent with age-related atrophy. Low-attenuation changes in the deep white matter consistent with small vessel ischemia. Vascular: No hyperdense vessel or unexpected calcification. Skull: The skull is intact. No fracture or focal lesion identified. Sinuses/Orbits: The visualized paranasal sinuses and mastoid air cells are clear. The orbits and globes intact. Other: None IMPRESSION: No acute intracranial abnormality. Findings consistent with age related atrophy and chronic small vessel ischemia Electronically Signed   By: Jonna Clark M.D.   On: 11/04/2020 23:36   MR BRAIN WO CONTRAST  Result Date: 11/05/2020 CLINICAL DATA:  Altered mental status, fall EXAM: MRI HEAD WITHOUT CONTRAST TECHNIQUE: Multiplanar, multiecho pulse sequences of the brain and surrounding structures were obtained without intravenous contrast. COMPARISON:  January 2018 FINDINGS: Brain: There is no acute infarction or intracranial hemorrhage. There is no intracranial mass, mass effect, or edema. There is no hydrocephalus or extra-axial fluid collection. Patchy foci of T2 hyperintensity in the supratentorial white matter are nonspecific but probably reflect mild to moderate chronic microvascular ischemic changes. 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 Small chronic infarcts of the centrum semiovale bilaterally. Punctate foci of susceptibility within the left cerebellum and right frontal subcortical white matter likely reflect chronic microhemorrhage. Ventricles and sulci are prominent reflecting generalized parenchymal volume loss. Vascular: Major vessel flow voids at the skull base are preserved. Skull and upper cervical spine:  Normal marrow signal is preserved. Sinuses/Orbits: Minor mucosal thickening. Bilateral lens replacements. Other: Sella is unremarkable.  Mastoid air cells are clear. IMPRESSION: No evidence of recent infarction, hemorrhage, or mass. Chronic microvascular ischemic changes and small chronic infarcts similar to prior study. Electronically Signed   By: Guadlupe Spanish M.D.   On: 11/05/2020 11:18   DG Chest Port 1 View  Result Date: 11/04/2020 CLINICAL DATA:  Fall 1 week ago with fractured shoulder EXAM: PORTABLE CHEST 1 VIEW COMPARISON:  Radiograph 10/04/2017, radiographs 11/02/2020 FINDINGS: Previously seen fracture left humeral neck is not well visualized on this radiograph given collimation of much of the left upper extremity.  No other acute osseous abnormality is seen. Prior cervical fusion is incompletely assessed. Telemetry leads overlie the chest. Chronic hyperinflation and coarsened interstitial changes with bronchitic features. No consolidative process. No pneumothorax or effusion. No convincing features of edema. Large hiatal hernia unchanged from prior. Calcified tortuous aorta. Remaining cardiomediastinal contours are unremarkable. IMPRESSION: 1. Previously seen fracture left humeral neck is not well visualized on this radiograph given collimation of much of the left upper extremity. 2. No acute cardiopulmonary abnormality. 3. Large hiatal hernia. 4.  Aortic Atherosclerosis (ICD10-I70.0). Electronically Signed   By: Kreg ShropshirePrice  DeHay M.D.   On: 11/04/2020 21:26       LOS: 1 day   Osvaldo ShipperGokul Bette Brienza  [facility] Pager on [email or web address]  11/06/2020, 12:48 PM

## 2020-11-06 NOTE — Procedures (Signed)
Patient Name: FAUNA NEUNER  MRN: [id number]  Epilepsy Attending: Charlsie Quest  Referring Physician/Provider: Dr Osvaldo Shipper Date: 11/06/2020 Duration: 23.25 mins  Patient history: 80yo F with ams. EEG to evaluate for seizure  Level of alertness: Awake  AEDs during EEG study: None  Technical aspects: This EEG study was done with scalp electrodes positioned according to the 10-20 International system of electrode placement. Electrical activity was acquired at a sampling rate of 500Hz  and reviewed with a high frequency filter of 70Hz  and a low frequency filter of 1Hz . EEG data were recorded continuously and digitally stored.   Description: The posterior dominant rhythm consists of 7 Hz activity of moderate voltage (25-35 uV) seen predominantly in posterior head regions, symmetric and reactive to eye opening and eye closing. Hyperventilation and photic stimulation were not performed.     ABNORMALITY - Background slow  IMPRESSION: This study is suggestive of mild diffuse encephalopathy, nonspecific etiology. No seizures or epileptiform discharges were seen throughout the recording.  Devony Mcgrady 

## 2020-11-06 NOTE — Progress Notes (Signed)
EEG Completed; Results Pending  

## 2020-11-06 NOTE — Progress Notes (Signed)
PT Cancellation Note  Patient Details Name: Carrie Glenn MRN: [id number] DOB: Mar 09, 1941   Cancelled Treatment:    Reason Eval/Treat Not Completed: Fatigue/lethargy limiting ability to participate (pt sleeping soundly, unable to arouse pt with verbal/tactile/noxious stimulii. Per RN, pt was awake until 3:00am last night and is exhausted. Will follow.)   Tamala Ser PT 11/06/2020  Acute Rehabilitation Services Pager [phone] Office [phone]

## 2020-11-06 NOTE — Evaluation (Signed)
SLP Cancellation Note  Patient Details Name: KAYELYNN ABDOU MRN: [id number] DOB: 08-05-1941   Cancelled treatment:       Reason Eval/Treat Not Completed: Other (comment) (pt not alert enough for eval or po, when SLP stimulated her tactially and verbally - she immediately winced - likely due to overt pain, will continue efforts) Rolena Infante, MS [facility] SLP Acute Rehab Services Office [phone] Pager [phone]    Chales Abrahams 11/06/2020, 10:01 AM

## 2020-11-06 NOTE — ED Notes (Signed)
Blood observed leaking from left shoulder immobilizer. Immobilizer removed. Wound redressed and reinforced. New shoulder immobilizer applied.

## 2020-11-07 DIAGNOSIS — I10 Essential (primary) hypertension: Secondary | ICD-10-CM | POA: Diagnosis not present

## 2020-11-07 DIAGNOSIS — E1165 Type 2 diabetes mellitus with hyperglycemia: Secondary | ICD-10-CM | POA: Diagnosis not present

## 2020-11-07 DIAGNOSIS — G9341 Metabolic encephalopathy: Secondary | ICD-10-CM | POA: Diagnosis not present

## 2020-11-07 DIAGNOSIS — D539 Nutritional anemia, unspecified: Secondary | ICD-10-CM | POA: Diagnosis not present

## 2020-11-07 LAB — CBC
HCT: 27 % — ABNORMAL LOW (ref 36.0–46.0)
Hemoglobin: 8.8 g/dL — ABNORMAL LOW (ref 12.0–15.0)
MCH: 33.8 pg (ref 26.0–34.0)
MCHC: 32.6 g/dL (ref 30.0–36.0)
MCV: 103.8 fL — ABNORMAL HIGH (ref 80.0–100.0)
Platelets: 144 10*3/uL — ABNORMAL LOW (ref 150–400)
RBC: 2.6 MIL/uL — ABNORMAL LOW (ref 3.87–5.11)
RDW: 12.8 % (ref 11.5–15.5)
WBC: 5.7 10*3/uL (ref 4.0–10.5)
nRBC: 0 % (ref 0.0–0.2)

## 2020-11-07 LAB — COMPREHENSIVE METABOLIC PANEL
ALT: 10 U/L (ref 0–44)
AST: 24 U/L (ref 15–41)
Albumin: 3.4 g/dL — ABNORMAL LOW (ref 3.5–5.0)
Alkaline Phosphatase: 37 U/L — ABNORMAL LOW (ref 38–126)
Anion gap: 10 (ref 5–15)
BUN: 17 mg/dL (ref 8–23)
CO2: 22 mmol/L (ref 22–32)
Calcium: 8.6 mg/dL — ABNORMAL LOW (ref 8.9–10.3)
Chloride: 105 mmol/L (ref 98–111)
Creatinine, Ser: 0.81 mg/dL (ref 0.44–1.00)
GFR, Estimated: >60 mL/min (ref >60)
Glucose, Bld: 113 mg/dL — ABNORMAL HIGH (ref 70–99)
Potassium: 3.8 mmol/L (ref 3.5–5.1)
Sodium: 137 mmol/L (ref 135–145)
Total Bilirubin: 1 mg/dL (ref 0.3–1.2)
Total Protein: 6 g/dL — ABNORMAL LOW (ref 6.5–8.1)

## 2020-11-07 LAB — GLUCOSE, CAPILLARY
Glucose-Capillary: 112 mg/dL — ABNORMAL HIGH (ref 70–99)
Glucose-Capillary: 108 mg/dL — ABNORMAL HIGH (ref 70–99)
Glucose-Capillary: 177 mg/dL — ABNORMAL HIGH (ref 70–99)
Glucose-Capillary: 123 mg/dL — ABNORMAL HIGH (ref 70–99)
Glucose-Capillary: 139 mg/dL — ABNORMAL HIGH (ref 70–99)

## 2020-11-07 LAB — HEMOGLOBIN A1C
Hgb A1c MFr Bld: 6 % — ABNORMAL HIGH (ref 4.8–5.6)
Mean Plasma Glucose: 125.5 mg/dL

## 2020-11-07 MED ORDER — GABAPENTIN 100 MG PO CAPS
100.0000 mg | ORAL_CAPSULE | Freq: Every day | ORAL | Status: DC
  Administered 2020-11-07 – 2020-11-11 (×5): 100 mg via ORAL
  Filled 2020-11-07 (×5): qty 1

## 2020-11-07 MED ORDER — CARBIDOPA-LEVODOPA ER 25-100 MG PO TBCR
1.0000 | EXTENDED_RELEASE_TABLET | Freq: Two times a day (BID) | ORAL | Status: DC
  Administered 2020-11-07 – 2020-11-12 (×11): 1 via ORAL
  Filled 2020-11-07 (×12): qty 1

## 2020-11-07 NOTE — Evaluation (Signed)
Clinical/Bedside Swallow Evaluation Patient Details  Name: Carrie Glenn MRN: [id number] Date of Birth: 30-Jul-1941  Today's Date: 11/07/2020 Time: SLP Start Time (ACUTE ONLY): 0848 SLP Stop Time (ACUTE ONLY): 0920 SLP Time Calculation (min) (ACUTE ONLY): 32 min  Past Medical History:  Past Medical History:  Diagnosis Date  . Acute encephalopathy 04/09/2015  . Acute renal failure (HCC) 03/09/2015  . Acute UTI 03/08/2015  . Arthritis   . Awareness of heartbeats 07/10/2014  . Blind    Legally  . Chronic back pain 03/08/2015  . Controlled type 2 diabetes mellitus without complication (HCC) 06/10/2015  . Degeneration of intervertebral disc of lumbar region 09/04/2015  . Degenerative arthritis of lumbar spine 08/15/2014  . Diabetes (HCC) 04/07/2015  . Diabetes mellitus without complication (HCC)   . Difficult or painful urination 05/23/2014  . Excessive urination at night 01/31/2014  . Frequent falls 04/09/2015  . Gastro-esophageal reflux disease without esophagitis 09/19/2015  . Hard of hearing   . HCAP (healthcare-associated pneumonia) 04/09/2015  . HTN (hypertension) 04/07/2015  . Hypertension   . Hypotension 03/08/2015  . Osteoporosis   . Pain    Chronic back  . Retinitis pigmentosa of both eyes   . Type 2 diabetes mellitus (HCC) 11/28/2014   Past Surgical History:  Past Surgical History:  Procedure Laterality Date  . HIATAL HERNIA REPAIR    . left foot surgery  Left    Hammer toe fixation-2nd toe  . TOE AMPUTATION Left    Left 4th toe, partial amputation due to wound not healing   HPI:  80 y.o. female with history of dementia, chronic kidney disease stage III, diabetes mellitus, Parkinson's, hypertension was recently brought into the ER 2 days ago after patient had a fall and fractured her left shoulder.  Patient was eventually discharged back to skilled nursing facility. Patient was found to be acutely confused.  Admitted 2 days later.  Swallow eval ordered.  CXR negative for acute  but shows large hiatal hernia.  CT head chronic left cerebellar cva, chronic right frontal white matter microhemorrhage.  Pt was lethargic yesterday.   Assessment / Plan / Recommendation Clinical Impression  Functional oropharyngeal swallow based on clinical assessment. Pt is VERY HOH and legally blind thus communication was challenging even with  her left hearing aid in place and functioning.  No focal CN deficits based on tasks pt able to complete.  Pt oriented to self and place but not situation.  She willingly consumed pureed breakfast of grits, waffle, coffee and water.  Adequate oral clearance noted with efficient mastication.  Subtle  throat clear noted x1 of 8 thin boluses.  Recommend dys3/thin diet  due to hiatal hernia and for energy conservation and provide medications with puree.  Pt benefited from the overhead lights turned on due to her blindness, please have turned on for at least meals.  No SLP follow up needed. SLP Visit Diagnosis: Dysphagia, unspecified (R13.10)    Aspiration Risk  Mild aspiration risk    Diet Recommendation Dysphagia 3 (Mech soft);Thin liquid   Liquid Administration via: Straw Medication Administration: Whole meds with puree Supervision: Staff to assist with self feeding Compensations: Slow rate;Small sips/bites Postural Changes: Seated upright at 90 degrees;Remain upright for at least 30 minutes after po intake    Other  Recommendations Oral Care Recommendations: Oral care BID   Follow up Recommendations None      Frequency and Duration   n/a         Prognosis  n/a    Swallow Study   General Date of Onset: 11/07/20 HPI: 80 y.o. female with history of dementia, chronic kidney disease stage III, diabetes mellitus, Parkinson's, hypertension was recently brought into the ER 2 days ago after patient had a fall and fractured her left shoulder.  Patient was eventually discharged back to skilled nursing facility. Patient was found to be acutely  confused.  Admitted 2 days later.  Swallow eval ordered.  CXR negative for acute but shows large hiatal hernia.  CT head chronic left cerebellar cva, chronic right frontal white matter microhemorrhage.  Pt was lethargic yesterday. Type of Study: Bedside Swallow Evaluation Diet Prior to this Study: Dysphagia 1 (puree);Thin liquids Temperature Spikes Noted: No Respiratory Status: Room air History of Recent Intubation: No Behavior/Cognition: Alert;Other (Comment) (very HOH and legally blind) Oral Cavity Assessment: Within Functional Limits Oral Care Completed by SLP: No (pt being fed when slp walked into room) Oral Cavity - Dentition: Adequate natural dentition Vision: Impaired for self-feeding Self-Feeding Abilities: Total assist (can help hold cup) Baseline Vocal Quality: Normal Volitional Cough: Cognitively unable to elicit Volitional Swallow: Unable to elicit    Oral/Motor/Sensory Function Overall Oral Motor/Sensory Function: Other (comment) (wfl for tasks assessed)   Ice Chips Ice chips: Not tested   Thin Liquid Thin Liquid: Within functional limits Presentation: Straw Pharyngeal  Phase Impairments: Throat Clearing - Immediate Other Comments: subtle throat clear x1 of 8 swallows, no indication of aspiration    Nectar Thick Nectar Thick Liquid: Not tested   Honey Thick Honey Thick Liquid: Not tested   Puree Puree: Within functional limits Presentation: Spoon   Solid     Solid: Within functional limits Presentation: Marita Snellen 11/07/2020,10:03 AM   Rolena Infante, MS [facility] SLP Acute Rehab Services Office [phone] Pager [phone]

## 2020-11-07 NOTE — Progress Notes (Signed)
Occupational Therapy Evaluation Patient Details Name: Carrie Glenn MRN: [id number] DOB: 1941/04/09 Today's Date: 11/07/2020    History of Present Illness 80 y.o. female with history of dementia, chronic kidney disease stage III, diabetes mellitus, Parkinson's, hypertension was recently brought into the ER 2 days PTA after patient had a fall and fractured her left shoulder.  Patient was eventually discharged back to skilled nursing facility patient was found to be acutely confused.  Was brought back to the ER. Dx of acute encephalopathy, CT of head and MRI of brain negative for acute changes.   Clinical Impression   Pt presents with above diagnosis. More alert this session and Ox1. Prior to recent admission pt requires assistance due to weakness and limitations with vision and hearing. Pt currently is total A in self care tasks requiring lighting and positioning of items. Pt Total A +2 supine to sit EOB, initially mod A for sitting support then patient able to sit with min guard for safety. Sling repositioned for improved support to LUE Mod to Max A +2 stand pivot 2  HHA from bed to recliner. Pt will benefit from skilled OT to address established deficits and DC to SNF.    Follow Up Recommendations  SNF;Supervision/Assistance - 24 hour    Equipment Recommendations  Other (comment) (defer to next venue of care)    Recommendations for Other Services       Precautions / Restrictions Precautions Precautions: Fall Precaution Comments: legally blind Required Braces or Orthoses: Other Brace Other Brace: LUE immobilizer Restrictions Weight Bearing Restrictions: Yes LUE Weight Bearing: Non weight bearing      Mobility Bed Mobility Overal bed mobility: Needs Assistance Bed Mobility: Supine to Sit Rolling: Total assist   Supine to sit: Total assist;HOB elevated;+2 for physical assistance;+2 for safety/equipment Sit to supine: Total assist;HOB elevated   General bed mobility comments:  assist to raise trunk and pivot hips with pad, pt unable to assist 2* confusion/HOH/blindness    Transfers Overall transfer level: Needs assistance Equipment used: 2 person hand held assist Transfers: Sit to/from [employer] Sit to Stand: Mod assist;Max assist Stand pivot transfers: Mod assist;Max assist       General transfer comment: +2 mod/max to rise and pivot to recliner, pt able to bear weight thru BLEs    Balance Overall balance assessment: Needs assistance Sitting-balance support: Feet supported;Single extremity supported Sitting balance-Leahy Scale: Poor Sitting balance - Comments: Initially poor with posterior lean, then fair after 2-3 minutes -able to maintain trunk upright Postural control: Posterior lean   Standing balance-Leahy Scale: Zero                             ADL either performed or assessed with clinical judgement   ADL Overall ADL's : Needs assistance/impaired                                       General ADL Comments: Pt overall requiring total A for all aspects of care. She is very hard of hearing and unable to follow and commands this session. Total A for bed mobility with pt crying out unsure if it is secondary to "surprise" or pain.     Vision Baseline Vision/History: Legally blind Patient Visual Report: No change from baseline Additional Comments: per chart, pt is legally blind but is observed to track inroom.Can see better  with brighter lighting and items placed directly in front.     Perception     Praxis      Pertinent Vitals/Pain Pain Assessment: Faces Faces Pain Scale: Hurts little more Pain Location: back Pain Descriptors / Indicators: Grimacing;Guarding Pain Intervention(s): Limited activity within patient's tolerance;Repositioned;Monitored during session     Hand Dominance Right   Extremity/Trunk Assessment Upper Extremity Assessment Upper Extremity Assessment: Generalized  weakness LUE Deficits / Details: L displaced humeral neck fx visible with shoulder sling donned to immobilize LUE: Unable to fully assess due to immobilization;Unable to fully assess due to pain   Lower Extremity Assessment Lower Extremity Assessment: Difficult to assess due to impaired cognition RLE Deficits / Details: Pt maintains LLE plantarflexion with PT attempting to perform PROM to neutral with only minimal movement noted, able to achieve neutral R ankle. Dgtr reports pt has parskinson's dementia & is able to place L foot flat on floor with cuing when wearing shoe. BLE rigid. LLE Deficits / Details: Pt maintains LLE plantarflexion with PT attempting to perform PROM to neutral with only minimal movement noted, able to achieve neutral R ankle. Dgtr reports pt has parskinson's dementia & is able to place L foot flat on floor with cuing when wearing shoe. BLE rigid.   Cervical / Trunk Assessment Cervical / Trunk Assessment: Kyphotic   Communication Communication Communication: HOH   Cognition Arousal/Alertness: Awake/alert Behavior During Therapy: Flat affect Overall Cognitive Status: No family/caregiver present to determine baseline cognitive functioning                                 General Comments: Difficult to assess 2/2 impaired vision/hearing. Pt able to state her birthdate, not aware of location nor situation, but did recall she's in the hospital several minutes after being told she is in the hospital. Able to follow 1 step commands.   General Comments  severe contusions observed throughout LUE, sling repositioned for improved support.    Exercises     Shoulder Instructions      Home Living Family/patient expects to be discharged to:: Private residence Living Arrangements: Children Available Help at Discharge: Family Type of Home: House Home Access: Stairs to enter Secretary/administrator of Steps: 3- 4 Entrance Stairs-Rails: Right Home Layout: One  level               Home Equipment: Other (comment) (rollator)   Additional Comments: home information obtained via prior  PT evaluation when daughter was present to confirm. No family present today.      Prior Functioning/Environment Level of Independence: Needs assistance  Gait / Transfers Assistance Needed: supervision with rollator for mobility ADL's / Homemaking Assistance Needed: daughter assists as needed   Comments: per recent PT note, Pt's daughter [name]) reports pt was ambulatory with rollator with supervision, utilized rails/grab bars to negotiate stairs into house. 2 falls in past 6 months.        OT Problem List: Decreased strength;Decreased range of motion;Decreased activity tolerance;Impaired balance (sitting and/or standing);Decreased safety awareness;Pain;Decreased cognition;Impaired UE functional use;Decreased knowledge of use of DME or AE;Impaired vision/perception      OT Treatment/Interventions: Self-care/ADL training;Therapeutic exercise;Manual therapy;Energy conservation;DME and/or AE instruction;Cognitive remediation/compensation;Therapeutic activities;Balance training;Patient/family education    OT Goals(Current goals can be found in the care plan section) Acute Rehab OT Goals Patient Stated Goal: unable to verbalize OT Goal Formulation: With patient Time For Goal Achievement: 11/17/20 Potential to Achieve Goals: Good  OT Frequency: Min 1X/week   Barriers to D/C:            Co-evaluation PT/OT/SLP Co-Evaluation/Treatment: Yes Reason for Co-Treatment: Complexity of the patient's impairments (multi-system involvement);For patient/therapist safety;Necessary to address cognition/behavior during functional activity;To address functional/ADL transfers PT goals addressed during session: Mobility/safety with mobility;Balance OT goals addressed during session: ADL's and self-care;Strengthening/ROM      AM-PAC OT "6 Clicks" Daily Activity     Outcome  Measure Help from another person eating meals?: Total Help from another person taking care of personal grooming?: Total Help from another person toileting, which includes using toliet, bedpan, or urinal?: Total Help from another person bathing (including washing, rinsing, drying)?: Total Help from another person to put on and taking off regular upper body clothing?: Total Help from another person to put on and taking off regular lower body clothing?: Total 6 Click Score: 6   End of Session Equipment Utilized During Treatment: Gait belt Nurse Communication: Mobility status;Precautions  Activity Tolerance: Patient limited by pain Patient left: with call bell/phone within reach;in chair;with chair alarm set  OT Visit Diagnosis: Unsteadiness on feet (R26.81);Muscle weakness (generalized) (M62.81);Repeated falls (R29.6);Pain Pain - Right/Left: Left Pain - part of body: Shoulder                Time: 0263-7858 OT Time Calculation (min): 30 min Charges:  OT General Charges $OT Visit: 1 Visit OT Evaluation $OT Eval High Complexity: 1 High  [place]quette Old, MSOT, OTR/L  Supplemental Rehabilitation Services  [phone]   Zigmund Daniel 11/07/2020, 12:03 PM

## 2020-11-07 NOTE — Progress Notes (Signed)
    Durable Medical Equipment  (From admission, onward)         Start     Ordered   11/07/20 1137  For home use only DME lightweight manual wheelchair with seat cushion  Once       Comments: Patient suffers from weakness which impairs their ability to perform daily activities like walking in the home.  A Behrend will not resolve  issue with performing activities of daily living. A wheelchair will allow patient to safely perform daily activities. Patient is not able to propel themselves in the home using a standard weight wheelchair due to weakness. Patient can self propel in the lightweight wheelchair. Length of need lifetime. Accessories: elevating leg rests (ELRs), wheel locks, extensions and anti-tippers.   11/07/20 1145

## 2020-11-07 NOTE — TOC Progression Note (Addendum)
Transition of Care [facility]) - Progression Note    Patient Details  Name: Carrie Glenn MRN: [id number] Date of Birth: 21-Apr-1941  [facility] CM/SW Contact  Estephani Popper, Meriam Sprague, RN Phone Number: 11/07/2020, 11:49 AM  Clinical Narrative:     Spoke with daughter via phone for dc planning. Daughter still wants to take pt home with her. She is requesting a wheelchair for home. Wheelchair ordered from [employer] and will be delivered to pt room. Pt was already set up with [facility] for [employer] services. Daughter requesting ambulance transport home. Outpatient Palliative referral made to [facility] per MD request.  Expected Discharge Plan: Home w Home Health Services Barriers to Discharge: Continued Medical Work up  Expected Discharge Plan and Services Expected Discharge Plan: Home w Home Health Services In-house Referral: Clinical Social Work   Post Acute Care Choice: Home Health Living arrangements for the past 2 months: Single Family Home                      Social Determinants of Health (SDOH) Interventions    Readmission Risk Interventions No flowsheet data found.

## 2020-11-07 NOTE — Progress Notes (Signed)
[facility] PROGRESS NOTE   Carrie Glenn UJW:[id number] DOB: [birth year] DOA: 11/04/2020  PCP: [facility]  Brief History/Interval Summary: 80 y.o. female with history of dementia, chronic kidney disease stage III, diabetes mellitus, Parkinson's, hypertension was recently brought into the ER 2 days ago after patient had a fall and fractured her left shoulder.  Patient was eventually discharged back to skilled nursing facility. Patient was found to be acutely confused.  Was brought back to the ER.  Reason for Visit: Acute metabolic encephalopathy  Consultants: None  Procedures:  EEG This study is suggestive of mild diffuse encephalopathy, nonspecific etiology. No seizures or epileptiform discharges were seen throughout the recording.  Antibiotics: Anti-infectives (From admission, onward)   None      Subjective/Interval History: Patient noted to be some more responsive today compared to yesterday.  She is hard of hearing.  She also need to repeat my questions.  Does not appear to be in any discomfort.     Assessment/Plan:  Acute metabolic encephalopathy Baseline mental status is not clearly known.  Reason for her current presentation is unclear.  UA was unremarkable.  Influenza and COVID-19 test were negative.  Chest x-ray did not show any pneumonia.  CT head did not show any acute findings.  MRI brain negative for acute stroke.  TSH normal at 2.6.  B12 low normal at 325.  Folate levels normal.  Ammonia level was normal at 10.  EEG did not show any epileptiform activity..  Daughter did report that when the patient fell few days ago she might have hit her head against a box.  Quite possible that she may have had a concussion. Mentation seems to be slightly better today.  Continue to engage with patient.  Mobilize.  History of dementia Apparently she is somewhat communicative at baseline.  See above.  History of Parkinson's disease Resume Sinemet once patient is  safe to swallow.  Speech therapy consult has been ordered.  Diabetes mellitus type 2 CBGs are reasonably well controlled.  Continue SSI.  HbA1c 6.0.    Chronic kidney disease stage IIIa Stable.  Monitor urine output.  Essential hypertension Her medications are on hold since she was not considered safe for oral intake.  Occasional high readings noted.  Continue to monitor.  Recent left shoulder fracture Continue with shoulder immobilization.  Sling.  Pain control.  PT and OT evaluation.  Outpatient follow-up with orthopedics.  It looks like this case was discussed with Dr. Hyacinth Meeker in [facility].  Macrocytic anemia Drop in hemoglobin is likely dilutional with some mild bleeding associated with her recent injury.  Hemoglobin stable this morning. No obvious overt bleeding noted.  Anemia panel reviewed.  No clear-cut deficiencies identified.  B12 was low normal.  She was given 1 dose of IM injection.  May benefit from oral supplementation at discharge.    Goals of care Patient remains DNR.  Mentation seems to be slightly better today.  Unclear if this is going to be a consistent improvement or if she has waxing and waning.  Discussed with daughter in detail yesterday.  Continue to monitor for the next 2448 hrs.    DVT Prophylaxis: Subcutaneous heparin Code Status: DNR Family Communication: Daughter being updated daily Disposition Plan: Hopefully return back to her nursing facility when improved.  Status is: Inpatient  Remains inpatient appropriate because:Altered mental status   Dispo:  Patient From: Skilled Nursing Facility  Planned Disposition: Home  Expected discharge date: 11/08/2020  Medically stable for  discharge: No        Medications:  Scheduled: . heparin  5,000 Units Subcutaneous Q8H  . insulin aspart  0-6 Units Subcutaneous Q4H   Continuous: . sodium chloride 50 mL/hr at 11/06/20 1923   CBJ:[id number] **OR** acetaminophen   Objective:  Vital  Signs  Vitals:   11/06/20 1125 11/06/20 1610 11/06/20 2127 11/07/20 0413  BP: (!) 154/90 (!) 147/82 133/79 (!) 158/94  Pulse: 74 92 75 85  Resp: 16 15 16 16   Temp: 98.3 F (36.8 C) 99.3 F (37.4 C) 98.1 F (36.7 C) 98 F (36.7 C)  TempSrc: Oral Oral Oral Oral  SpO2: 100% 97% 99% 98%  Weight:      Height:        Intake/Output Summary (Last 24 hours) at 11/07/2020 1046 Last data filed at 11/07/2020 0420 Gross per 24 hour  Intake --  Output 700 ml  Net -700 ml   Filed Weights   11/06/20 0327  Weight: 48.4 kg    General appearance: More awake and alert today.  Remains confused and distracted.  Hard of hearing. Resp: Clear to auscultation bilaterally.  Normal effort Cardio: S1-S2 is normal regular.  No S3-S4.  No rubs murmurs or bruit GI: Abdomen is soft.  Nontender nondistended.  Bowel sounds are present normal.  No masses organomegaly Extremities: No edema.  She does move her legs.  Left upper extremity is in a sling.  Bruising noted over the left chest as well as upper arm area Neurologic:  No focal neurological deficits.     Lab Results:  Data Reviewed: I have personally reviewed following labs and imaging studies  CBC: Recent Labs  Lab 11/02/20 0928 11/04/20 2200 11/05/20 0108 11/06/20 0608 11/07/20 0605  WBC 7.8 11.3* 10.9* 6.7 5.7  NEUTROABS 6.6 8.9*  --   --   --   HGB 11.2* 11.1* 9.9* 8.7* 8.8*  HCT 34.3* 33.6* 30.2* 27.5* 27.0*  MCV 100.9* 101.2* 102.0* 105.8* 103.8*  PLT 138* 146* 139* 135* 144*    Basic Metabolic Panel: Recent Labs  Lab 11/02/20 0928 11/04/20 2200 11/05/20 0108 11/05/20 0450 11/06/20 0608 11/07/20 0605  NA 138 135  --  138 136 137  K 4.2 4.3  --  3.8 3.5 3.8  CL 101 97*  --  102 103 105  CO2 27 28  --  26 22 22   GLUCOSE 185* 173*  --  132* 137* 113*  BUN 17 19  --  16 19 17   CREATININE 1.08* 1.12* 1.00 0.94 0.94 0.81  CALCIUM 9.4 10.6*  --  9.3 8.4* 8.6*    GFR: Estimated Creatinine Clearance: 41.6 mL/min (by C-G  formula based on SCr of 0.81 mg/dL).  Liver Function Tests: Recent Labs  Lab 11/04/20 2200 11/05/20 0450 11/06/20 0608 11/07/20 0605  AST 36 37 27 24  ALT 11 13 11 10   ALKPHOS 50 43 35* 37*  BILITOT 1.0 0.8 0.7 1.0  PROT 7.7 6.8 5.7* 6.0*  ALBUMIN 4.4 3.9 3.2* 3.4*    Coagulation Profile: Recent Labs  Lab 11/04/20 2200  INR 1.1     CBG: Recent Labs  Lab 11/06/20 1627 11/06/20 2129 11/06/20 2345 11/07/20 0416 11/07/20 0800  GLUCAP 117* 134* 104* 112* 108*      Recent Results (from the past 240 hour(s))  SARS Coronavirus 2 by RT PCR (hospital order, performed in [facility] hospital lab) Nasopharyngeal Nasopharyngeal Swab     Status: None   Collection Time: 11/02/20  1:58  PM   Specimen: Nasopharyngeal Swab  Result Value Ref Range Status   SARS Coronavirus 2 NEGATIVE NEGATIVE Final    Comment: (NOTE) SARS-CoV-2 target nucleic acids are NOT DETECTED.  The SARS-CoV-2 RNA is generally detectable in upper and lower respiratory specimens during the acute phase of infection. The lowest concentration of SARS-CoV-2 viral copies this assay can detect is 250 copies / mL. A negative result does not preclude SARS-CoV-2 infection and should not be used as the sole basis for treatment or other patient management decisions.  A negative result may occur with improper specimen collection / handling, submission of specimen other than nasopharyngeal swab, presence of viral mutation(s) within the areas targeted by this assay, and inadequate number of viral copies (<250 copies / mL). A negative result must be combined with clinical observations, patient history, and epidemiological information.  Fact Sheet for Patients:   BoilerBrush.com.cyhttps://www.fda.gov/media/136312/download  Fact Sheet for Healthcare Providers: https://pope.com/https://www.fda.gov/media/136313/download  This test is not yet approved or  cleared by the [place] FDA and has been authorized for detection and/or diagnosis of  SARS-CoV-2 by FDA under an Emergency Use Authorization (EUA).  This EUA will remain in effect (meaning this test can be used) for the duration of the COVID-19 declaration under Section 564(b)(1) of the Act, 21 U.S.C. section 360bbb-3(b)(1), unless the authorization is terminated or revoked sooner.  Performed at [facility], [address] [place], [place] [address]   Urine culture     Status: Abnormal   Collection Time: 11/04/20  9:10 PM   Specimen: In/Out Cath Urine  Result Value Ref Range Status   Specimen Description   Final    IN/OUT CATH URINE Performed at [facility], [address] [place], [place] [address]    Special Requests   Final    NONE Performed at [facility], [address] [place], [place] [address]    Culture (A)  Final    <10,000 COLONIES/mL INSIGNIFICANT GROWTH Performed at [facility], [address] [place], [place] [address]    Report Status 11/06/2020 FINAL  Final  Resp Panel by RT-PCR (Flu A&B, Covid) Nasopharyngeal Swab     Status: None   Collection Time: 11/04/20  9:15 PM   Specimen: Nasopharyngeal Swab; Nasopharyngeal(NP) swabs in vial transport medium  Result Value Ref Range Status   SARS Coronavirus 2 by RT PCR NEGATIVE NEGATIVE Final    Comment: (NOTE) SARS-CoV-2 target nucleic acids are NOT DETECTED.  The SARS-CoV-2 RNA is generally detectable in upper respiratory specimens during the acute phase of infection. The lowest concentration of SARS-CoV-2 viral copies this assay can detect is 138 copies/mL. A negative result does not preclude SARS-Cov-2 infection and should not be used as the sole basis for treatment or other patient management decisions. A negative result may occur with  improper specimen collection/handling, submission of specimen other than nasopharyngeal swab, presence of viral mutation(s) within the areas targeted by this assay, and inadequate number of  viral copies(<138 copies/mL). A negative result must be combined with clinical observations, patient history, and epidemiological information. The expected result is Negative.  Fact Sheet for Patients:  BloggerCourse.comhttps://www.fda.gov/media/152166/download  Fact Sheet for Healthcare Providers:  SeriousBroker.ithttps://www.fda.gov/media/152162/download  This test is no t yet approved or cleared by the [place] FDA and  has been authorized for detection and/or diagnosis of SARS-CoV-2 by FDA under an Emergency Use Authorization (EUA). This EUA will remain  in effect (meaning this test can be used) for the  duration of the COVID-19 declaration under Section 564(b)(1) of the Act, 21 U.S.C.section 360bbb-3(b)(1), unless the authorization is terminated  or revoked sooner.       Influenza A by PCR NEGATIVE NEGATIVE Final   Influenza B by PCR NEGATIVE NEGATIVE Final    Comment: (NOTE) The Xpert Xpress SARS-CoV-2/FLU/RSV plus assay is intended as an aid in the diagnosis of influenza from Nasopharyngeal swab specimens and should not be used as a sole basis for treatment. Nasal washings and aspirates are unacceptable for Xpert Xpress SARS-CoV-2/FLU/RSV testing.  Fact Sheet for Patients: BloggerCourse.com  Fact Sheet for Healthcare Providers: SeriousBroker.it  This test is not yet approved or cleared by the [place] FDA and has been authorized for detection and/or diagnosis of SARS-CoV-2 by FDA under an Emergency Use Authorization (EUA). This EUA will remain in effect (meaning this test can be used) for the duration of the COVID-19 declaration under Section 564(b)(1) of the Act, 21 U.S.C. section 360bbb-3(b)(1), unless the authorization is terminated or revoked.  Performed at [facility], [address], [place], [place] [address]   Blood culture (routine single)     Status: None (Preliminary result)   Collection Time: 11/04/20  10:00 PM   Specimen: BLOOD  Result Value Ref Range Status   Specimen Description   Final    BLOOD RIGHT ANTECUBITAL Performed at [facility], [address], [place], [place] [address]    Special Requests   Final    BOTTLES DRAWN AEROBIC AND ANAEROBIC Blood Culture adequate volume Performed at [facility], [address] [place], [place] [address]    Culture   Final    NO GROWTH 2 DAYS Performed at [facility], [address], [place], [place] [address]    Report Status PENDING  Incomplete      Radiology Studies: MR BRAIN WO CONTRAST  Result Date: 11/05/2020 CLINICAL DATA:  Altered mental status, fall EXAM: MRI HEAD WITHOUT CONTRAST TECHNIQUE: Multiplanar, multiecho pulse sequences of the brain and surrounding structures were obtained without intravenous contrast. COMPARISON:  January 2018 FINDINGS: Brain: There is no acute infarction or intracranial hemorrhage. There is no intracranial mass, mass effect, or edema. There is no hydrocephalus or extra-axial fluid collection. Patchy foci of T2 hyperintensity in the supratentorial white matter are nonspecific but probably reflect mild to moderate chronic microvascular ischemic changes. 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 Small chronic infarcts of the centrum semiovale bilaterally. Punctate foci of susceptibility within the left cerebellum and right frontal subcortical white matter likely reflect chronic microhemorrhage. Ventricles and sulci are prominent reflecting generalized parenchymal volume loss. Vascular: Major vessel flow voids at the skull base are preserved. Skull and upper cervical spine: Normal marrow signal is preserved. Sinuses/Orbits: Minor mucosal thickening. Bilateral lens replacements. Other: Sella is unremarkable.  Mastoid air cells are clear. IMPRESSION: No evidence of recent infarction, hemorrhage, or mass. Chronic microvascular ischemic changes and small chronic  infarcts similar to prior study. Electronically Signed   By: Guadlupe Spanish M.D.   On: 11/05/2020 11:18   EEG adult  Result Date: 11/06/2020 Charlsie Quest, MD     11/06/2020  2:13 PM Patient Name: Carrie Glenn MRN: [id number] Epilepsy Attending: Charlsie Quest Referring Physician/Provider: Dr Osvaldo Shipper Date: 11/06/2020 Duration: 23.25 mins Patient history: 80yo F with ams. EEG to evaluate for seizure Level of alertness: Awake AEDs during EEG study: None Technical aspects: This EEG study was done with scalp electrodes positioned  according to the 10-20 International system of electrode placement. Electrical activity was acquired at a sampling rate of 500Hz  and reviewed with a high frequency filter of 70Hz  and a low frequency filter of 1Hz . EEG data were recorded continuously and digitally stored. Description: The posterior dominant rhythm consists of 7 Hz activity of moderate voltage (25-35 uV) seen predominantly in posterior head regions, symmetric and reactive to eye opening and eye closing. Hyperventilation and photic stimulation were not performed.   ABNORMALITY - Background slow IMPRESSION: This study is suggestive of mild diffuse encephalopathy, nonspecific etiology. No seizures or epileptiform discharges were seen throughout the recording. Priyanka       LOS: 2 days   Arhum Peeples Pager on [email or web address]  11/07/2020, 10:46 AM

## 2020-11-07 NOTE — Care Management Important Message (Signed)
Medicare IM printed for Carrie Glenn, NCM to give to the patient. 

## 2020-11-07 NOTE — Evaluation (Addendum)
Physical Therapy Evaluation Patient Details Name: Carrie Glenn MRN: [id number] DOB: October 05, 1940 Today's Date: 11/07/2020   History of Present Illness  80 y.o. female with history of dementia, chronic kidney disease stage III, diabetes mellitus, Parkinson's, hypertension was recently brought into the ER 2 days PTA after patient had a fall and fractured her left shoulder.  Patient was eventually discharged back to skilled nursing facility patient was found to be acutely confused.  Was brought back to the ER. Dx of acute encephalopathy, CT of head and MRI of brain negative for acute changes.  Clinical Impression  Pt admitted with above diagnosis. Pt more alert today, she is oriented to self only but can follow simple commands. +2 total assist for supine to sit. +2 mod/max assist for bed to recliner transfer.  Pt currently with functional limitations due to the deficits listed below (see PT Problem List). Pt will benefit from skilled PT to increase their independence and safety with mobility to allow discharge to the venue listed below.       Follow Up Recommendations Home health PT;SNF (if family unable to provide care);Supervision for mobility/OOB;Supervision/Assistance - 24 hour (per RN, daughter plans to take pt home). If DC home, will need ambulance transport as there are steps to enter and pt is non ambulatory at present.    Equipment Recommendations  If DC home: Wheelchair cushion (measurements PT);Wheelchair (measurements PT);Hospital bed;3in1 (PT)    Recommendations for Other Services       Precautions / Restrictions Precautions Precautions: Fall Precaution Comments: legally blind Required Braces or Orthoses: Other Brace Other Brace: LUE immobilizer Restrictions Weight Bearing Restrictions: Yes LUE Weight Bearing: Non weight bearing      Mobility  Bed Mobility Overal bed mobility: Needs Assistance Bed Mobility: Supine to Sit     Supine to sit: Total assist;HOB elevated;+2  for physical assistance;+2 for safety/equipment     General bed mobility comments: assist to raise trunk and pivot hips with pad, pt unable to assist 2* confusion/HOH/blindness    Transfers Overall transfer level: Needs assistance Equipment used: 2 person hand held assist Transfers: Sit to/from [employer] Sit to Stand: Mod assist;Max assist Stand pivot transfers: Mod assist;Max assist       General transfer comment: +2 mod/max to rise and pivot to recliner, pt able to bear weight thru BLEs  Ambulation/Gait                Stairs            Wheelchair Mobility    Modified Rankin (Stroke Patients Only)       Balance Overall balance assessment: Needs assistance Sitting-balance support: Feet supported;Single extremity supported Sitting balance-Leahy Scale: Poor Sitting balance - Comments: Initially poor with posterior lean, then fair after 2-3 minutes -able to maintain trunk upright Postural control: Posterior lean   Standing balance-Leahy Scale: Zero                               Pertinent Vitals/Pain Pain Assessment: Faces Faces Pain Scale: Hurts little more Pain Location: back Pain Descriptors / Indicators: Grimacing;Guarding Pain Intervention(s): Limited activity within patient's tolerance;Monitored during session;Patient requesting pain meds-RN notified;Repositioned    Home Living Family/patient expects to be discharged to:: Private residence Living Arrangements: Children Available Help at Discharge: Family Type of Home: House Home Access: Stairs to enter Entrance Stairs-Rails: Right Entrance Stairs-Number of Steps: 3- 4 Home Layout: One level Home Equipment: Other (  comment) (rollator) Additional Comments: home information obtained via prior  PT evaluation when daughter was present to confirm. No family present today.    Prior Function Level of Independence: Needs assistance   Gait / Transfers Assistance Needed:  supervision with rollator for mobility  ADL's / Homemaking Assistance Needed: daughter assists as needed  Comments: per recent PT note, Pt's daughter [name]) reports pt was ambulatory with rollator with supervision, utilized rails/grab bars to negotiate stairs into house. 2 falls in past 6 months.     Hand Dominance   Dominant Hand: Right    Extremity/Trunk Assessment   Upper Extremity Assessment Upper Extremity Assessment: Defer to OT evaluation LUE Deficits / Details: L displaced humeral neck fx visible with shoulder sling donned to immobilize    Lower Extremity Assessment Lower Extremity Assessment: Difficult to assess due to impaired cognition RLE Deficits / Details: Pt maintains LLE plantarflexion with PT attempting to perform PROM to neutral with only minimal movement noted, able to achieve neutral R ankle. Dgtr reports pt has parskinson's dementia & is able to place L foot flat on floor with cuing when wearing shoe. BLE rigid. LLE Deficits / Details: Pt maintains LLE plantarflexion with PT attempting to perform PROM to neutral with only minimal movement noted, able to achieve neutral R ankle. Dgtr reports pt has parskinson's dementia & is able to place L foot flat on floor with cuing when wearing shoe. BLE rigid.    Cervical / Trunk Assessment Cervical / Trunk Assessment: Kyphotic  Communication   Communication: HOH  Cognition Arousal/Alertness: Awake/alert Behavior During Therapy: Flat affect Overall Cognitive Status: No family/caregiver present to determine baseline cognitive functioning                                 General Comments: Difficult to assess 2/2 impaired vision/hearing. Pt able to state her birthdate, not aware of location nor situation, but did recall she's in the hospital several minutes after being told she is in the hospital. Able to follow 1 step commands.      General Comments      Exercises     Assessment/Plan    PT Assessment  Patient needs continued PT services  PT Problem List Decreased strength;Decreased mobility;Decreased safety awareness;Decreased range of motion;Decreased activity tolerance;Decreased skin integrity;Pain;Decreased knowledge of use of DME;Decreased balance       PT Treatment Interventions DME instruction;Functional mobility training;Therapeutic activities;Therapeutic exercise;Patient/family education    PT Goals (Current goals can be found in the Care Plan section)  Acute Rehab PT Goals Patient Stated Goal: unable to verbalize PT Goal Formulation: Patient unable to participate in goal setting Time For Goal Achievement: 11/21/20 Potential to Achieve Goals: Fair    Frequency Min 3X/week   Barriers to discharge        Co-evaluation PT/OT/SLP Co-Evaluation/Treatment: Yes Reason for Co-Treatment: Complexity of the patient's impairments (multi-system involvement);Necessary to address cognition/behavior during functional activity;For patient/therapist safety PT goals addressed during session: Mobility/safety with mobility;Balance         AM-PAC PT "6 Clicks" Mobility  Outcome Measure Help needed turning from your back to your side while in a flat bed without using bedrails?: Total Help needed moving from lying on your back to sitting on the side of a flat bed without using bedrails?: Total Help needed moving to and from a bed to a chair (including a wheelchair)?: Total Help needed standing up from a chair using your arms (  e.g., wheelchair or bedside chair)?: Total Help needed to walk in hospital room?: Total Help needed climbing 3-5 steps with a railing? : Total 6 Click Score: 6    End of Session Equipment Utilized During Treatment: Other (comment);Gait belt (LUE arm sling) Activity Tolerance: Patient tolerated treatment well Patient left: with call bell/phone within reach;in chair;with chair alarm set (unable to set bed alarm - nurse notified) Nurse Communication: Mobility  status PT Visit Diagnosis: Difficulty in walking, not elsewhere classified (R26.2);Muscle weakness (generalized) (M62.81)    Time: 7517-0017 PT Time Calculation (min) (ACUTE ONLY): 20 min   Charges:   PT Evaluation $PT Eval Moderate Complexity: 1 Mod          Tamala Ser PT 11/07/2020  Acute Rehabilitation Services Pager [phone] Office [phone]

## 2020-11-08 DIAGNOSIS — D539 Nutritional anemia, unspecified: Secondary | ICD-10-CM | POA: Diagnosis not present

## 2020-11-08 DIAGNOSIS — E1165 Type 2 diabetes mellitus with hyperglycemia: Secondary | ICD-10-CM | POA: Diagnosis not present

## 2020-11-08 DIAGNOSIS — G9341 Metabolic encephalopathy: Secondary | ICD-10-CM | POA: Diagnosis not present

## 2020-11-08 DIAGNOSIS — I10 Essential (primary) hypertension: Secondary | ICD-10-CM | POA: Diagnosis not present

## 2020-11-08 LAB — CBC
HCT: 28 % — ABNORMAL LOW (ref 36.0–46.0)
Hemoglobin: 8.9 g/dL — ABNORMAL LOW (ref 12.0–15.0)
MCH: 33 pg (ref 26.0–34.0)
MCHC: 31.8 g/dL (ref 30.0–36.0)
MCV: 103.7 fL — ABNORMAL HIGH (ref 80.0–100.0)
Platelets: 153 10*3/uL (ref 150–400)
RBC: 2.7 MIL/uL — ABNORMAL LOW (ref 3.87–5.11)
RDW: 12.8 % (ref 11.5–15.5)
WBC: 5.8 10*3/uL (ref 4.0–10.5)
nRBC: 0 % (ref 0.0–0.2)

## 2020-11-08 LAB — COMPREHENSIVE METABOLIC PANEL
ALT: <5 U/L (ref 0–44)
AST: 23 U/L (ref 15–41)
Albumin: 3.4 g/dL — ABNORMAL LOW (ref 3.5–5.0)
Alkaline Phosphatase: 39 U/L (ref 38–126)
Anion gap: 9 (ref 5–15)
BUN: 14 mg/dL (ref 8–23)
CO2: 23 mmol/L (ref 22–32)
Calcium: 8.6 mg/dL — ABNORMAL LOW (ref 8.9–10.3)
Chloride: 106 mmol/L (ref 98–111)
Creatinine, Ser: 0.81 mg/dL (ref 0.44–1.00)
GFR, Estimated: >60 mL/min (ref >60)
Glucose, Bld: 113 mg/dL — ABNORMAL HIGH (ref 70–99)
Potassium: 3.7 mmol/L (ref 3.5–5.1)
Sodium: 138 mmol/L (ref 135–145)
Total Bilirubin: 0.7 mg/dL (ref 0.3–1.2)
Total Protein: 5.8 g/dL — ABNORMAL LOW (ref 6.5–8.1)

## 2020-11-08 LAB — GLUCOSE, CAPILLARY
Glucose-Capillary: 120 mg/dL — ABNORMAL HIGH (ref 70–99)
Glucose-Capillary: 114 mg/dL — ABNORMAL HIGH (ref 70–99)
Glucose-Capillary: 123 mg/dL — ABNORMAL HIGH (ref 70–99)
Glucose-Capillary: 189 mg/dL — ABNORMAL HIGH (ref 70–99)
Glucose-Capillary: 161 mg/dL — ABNORMAL HIGH (ref 70–99)
Glucose-Capillary: 197 mg/dL — ABNORMAL HIGH (ref 70–99)

## 2020-11-08 MED ORDER — FELODIPINE ER 5 MG PO TB24
5.0000 mg | ORAL_TABLET | Freq: Every day | ORAL | Status: DC
  Administered 2020-11-08 – 2020-11-12 (×5): 5 mg via ORAL
  Filled 2020-11-08 (×5): qty 1

## 2020-11-08 MED ORDER — ASPIRIN EC 81 MG PO TBEC
81.0000 mg | DELAYED_RELEASE_TABLET | Freq: Every day | ORAL | Status: DC
Start: 2020-11-08 — End: 2020-11-13
  Administered 2020-11-08 – 2020-11-12 (×5): 81 mg via ORAL
  Filled 2020-11-08 (×5): qty 1

## 2020-11-08 MED ORDER — MIRTAZAPINE 15 MG PO TABS
7.5000 mg | ORAL_TABLET | Freq: Every day | ORAL | Status: DC
  Administered 2020-11-08 – 2020-11-11 (×4): 7.5 mg via ORAL
  Filled 2020-11-08 (×4): qty 1

## 2020-11-08 MED ORDER — OXYBUTYNIN CHLORIDE ER 5 MG PO TB24
10.0000 mg | ORAL_TABLET | Freq: Every day | ORAL | Status: DC
  Administered 2020-11-08 – 2020-11-12 (×5): 10 mg via ORAL
  Filled 2020-11-08 (×5): qty 2

## 2020-11-08 MED ORDER — MEMANTINE HCL 10 MG PO TABS
10.0000 mg | ORAL_TABLET | Freq: Two times a day (BID) | ORAL | Status: DC
  Administered 2020-11-08 – 2020-11-12 (×9): 10 mg via ORAL
  Filled 2020-11-08 (×9): qty 1

## 2020-11-08 NOTE — Progress Notes (Signed)
[facility] PROGRESS NOTE   Carrie Glenn KAJ:[id number] DOB: 06/23/1941 DOA: 11/04/2020  PCP: [facility]  Brief History/Interval Summary: 80 y.o. female with history of dementia, chronic kidney disease stage III, diabetes mellitus, Parkinson's, hypertension was recently brought into the ER 2 days ago after patient had a fall and fractured her left shoulder.  Patient was eventually discharged back to skilled nursing facility. Patient was found to be acutely confused.  Was brought back to the ER.  Reason for Visit: Acute metabolic encephalopathy  Consultants: None  Procedures:  EEG This study is suggestive of mild diffuse encephalopathy, nonspecific etiology. No seizures or epileptiform discharges were seen throughout the recording.  Antibiotics: Anti-infectives (From admission, onward)   None      Subjective/Interval History: Patient's mentation continues to improve.  She has her hearing aids in today and is able to protect hearing.  Denies any pain.  Asking for breakfast.   Assessment/Plan:  Acute metabolic encephalopathy Baseline mental status is not clearly known.  Reason for her current presentation is unclear.  UA was unremarkable.  Influenza and COVID-19 test were negative.  Chest x-ray did not show any pneumonia.  CT head did not show any acute findings.  MRI brain negative for acute stroke.  TSH normal at 2.6.  B12 low normal at 325.  Folate levels normal.  Ammonia level was normal at 10.  EEG did not show any epileptiform activity..  Daughter did report that when the patient fell few days ago she might have hit her head against a box.  Quite possible that she may have had a concussion. It appears that her mentation is continuing to improve.  Appetite appears to be improving as well.  Continue IV fluids for another 24 hours.  History of dementia Remains confused but could be closer to her baseline now.  History of Parkinson's disease Sinemet was  resumed yesterday.  Patient placed on a diet after being seen by speech therapy.    Diabetes mellitus type 2 CBGs are reasonably well controlled.  Continue SSI.  HbA1c 6.0.    Chronic kidney disease stage IIIa Stable.  Monitor urine output.  Essential hypertension Blood pressure medications had to be placed on hold since it was unsafe for her to take anything by mouth.  But now that she has improved we will resume some of her antihypertensives.  High readings noted.  Recent left shoulder fracture Continue with shoulder immobilization.  Sling.  Pain control.  PT and OT evaluation.  Outpatient follow-up with orthopedics.  It looks like this case was discussed with Dr. Deeann Saint in [facility].  Macrocytic anemia Drop in hemoglobin is likely dilutional with some mild bleeding associated with her recent injury.  Hemoglobin stable this morning. No obvious overt bleeding noted.  Anemia panel reviewed.  No clear-cut deficiencies identified.  B12 was low normal.  She was given 1 dose of IM injection.  May benefit from oral supplementation at discharge.    Goals of care Patient remains DNR.  To be improving daily.  Wait for improvement in oral intake as well.     DVT Prophylaxis: Subcutaneous heparin Code Status: DNR Family Communication: Daughter being updated daily Disposition Plan: It appears that patient's daughter wants to take her home instead of nursing facility.  Home health will need to be ordered at discharge.  Status is: Inpatient  Remains inpatient appropriate because:Altered mental status   Dispo:  Patient From: Skilled Nursing Facility  Planned Disposition: Home with  Health Care Svc  Expected discharge date: 11/09/2020  Medically stable for discharge: No        Medications:  Scheduled: . Carbidopa-Levodopa ER  1 tablet Oral BID  . gabapentin  100 mg Oral QHS  . heparin  5,000 Units Subcutaneous Q8H  . insulin aspart  0-6 Units Subcutaneous Q4H    Continuous: . sodium chloride 50 mL/hr at 11/07/20 2148   ZOX:[id number]:acetaminophen **OR** acetaminophen   Objective:  Vital Signs  Vitals:   11/07/20 0413 11/07/20 1435 11/07/20 2226 11/08/20 0542  BP: (!) 158/94 (!) 163/98 (!) 168/95 (!) 141/72  Pulse: 85 90 83 61  Resp: 16 16 18 18   Temp: 98 F (36.7 C) 98.3 F (36.8 C) 98.8 F (37.1 C) 98.6 F (37 C)  TempSrc: Oral Oral  Oral  SpO2: 98% 98% 99% 100%  Weight:      Height:        Intake/Output Summary (Last 24 hours) at 11/08/2020 0932 Last data filed at 11/08/2020 [id number] Gross per 24 hour  Intake 200 ml  Output 2000 ml  Net -1800 ml   Filed Weights   11/06/20 0327  Weight: 48.4 kg    General appearance: Much more awake and alert today.  Answering some questions but remains distracted for the most part. Resp: Clear to auscultation bilaterally.  Normal effort Cardio: S1-S2 is normal regular.  No S3-S4.  No rubs murmurs or bruit GI: Abdomen is soft.  Nontender nondistended.  Bowel sounds are present normal.  No masses organomegaly Extremities: No edema.   Neurologic:   No focal neurological deficits.     Lab Results:  Data Reviewed: I have personally reviewed following labs and imaging studies  CBC: Recent Labs  Lab 11/02/20 0928 11/04/20 2200 11/05/20 0108 11/06/20 0608 11/07/20 0605 11/08/20 0635  WBC 7.8 11.3* 10.9* 6.7 5.7 5.8  NEUTROABS 6.6 8.9*  --   --   --   --   HGB 11.2* 11.1* 9.9* 8.7* 8.8* 8.9*  HCT 34.3* 33.6* 30.2* 27.5* 27.0* 28.0*  MCV 100.9* 101.2* 102.0* 105.8* 103.8* 103.7*  PLT 138* 146* 139* 135* 144* 153    Basic Metabolic Panel: Recent Labs  Lab 11/04/20 2200 11/05/20 0108 11/05/20 0450 11/06/20 0608 11/07/20 0605 11/08/20 0635  NA 135  --  138 136 137 138  K 4.3  --  3.8 3.5 3.8 3.7  CL 97*  --  102 103 105 106  CO2 28  --  26 22 22 23   GLUCOSE 173*  --  132* 137* 113* 113*  BUN 19  --  16 19 17 14   CREATININE 1.12* 1.00 0.94 0.94 0.81 0.81  CALCIUM 10.6*  --  9.3  8.4* 8.6* 8.6*    GFR: Estimated Creatinine Clearance: 41.6 mL/min (by C-G formula based on SCr of 0.81 mg/dL).  Liver Function Tests: Recent Labs  Lab 11/04/20 2200 11/05/20 0450 11/06/20 0608 11/07/20 0605 11/08/20 0635  AST 36 37 27 24 23   ALT 11 13 11 10  <5  ALKPHOS 50 43 35* 37* 39  BILITOT 1.0 0.8 0.7 1.0 0.7  PROT 7.7 6.8 5.7* 6.0* 5.8*  ALBUMIN 4.4 3.9 3.2* 3.4* 3.4*    Coagulation Profile: Recent Labs  Lab 11/04/20 2200  INR 1.1     CBG: Recent Labs  Lab 11/07/20 1657 11/07/20 2023 11/08/20 0219 11/08/20 0544 11/08/20 0754  GLUCAP 123* 139* 120* 114* 123*      Recent Results (from the past 240 hour(s))  SARS Coronavirus  2 by RT PCR (hospital order, performed in [facility] hospital lab) Nasopharyngeal Nasopharyngeal Swab     Status: None   Collection Time: 11/02/20  1:58 PM   Specimen: Nasopharyngeal Swab  Result Value Ref Range Status   SARS Coronavirus 2 NEGATIVE NEGATIVE Final    Comment: (NOTE) SARS-CoV-2 target nucleic acids are NOT DETECTED.  The SARS-CoV-2 RNA is generally detectable in upper and lower respiratory specimens during the acute phase of infection. The lowest concentration of SARS-CoV-2 viral copies this assay can detect is 250 copies / mL. A negative result does not preclude SARS-CoV-2 infection and should not be used as the sole basis for treatment or other patient management decisions.  A negative result may occur with improper specimen collection / handling, submission of specimen other than nasopharyngeal swab, presence of viral mutation(s) within the areas targeted by this assay, and inadequate number of viral copies (<250 copies / mL). A negative result must be combined with clinical observations, patient history, and epidemiological information.  Fact Sheet for Patients:   BoilerBrush.com.cy  Fact Sheet for Healthcare Providers: https://pope.com/  This test is not  yet approved or  cleared by the [place] FDA and has been authorized for detection and/or diagnosis of SARS-CoV-2 by FDA under an Emergency Use Authorization (EUA).  This EUA will remain in effect (meaning this test can be used) for the duration of the COVID-19 declaration under Section 564(b)(1) of the Act, 21 U.S.C. section 360bbb-3(b)(1), unless the authorization is terminated or revoked sooner.  Performed at [facility], [address] [place], [place] [address]   Urine culture     Status: Abnormal   Collection Time: 11/04/20  9:10 PM   Specimen: In/Out Cath Urine  Result Value Ref Range Status   Specimen Description   Final    IN/OUT CATH URINE Performed at [facility], [address] [place], [place] [address]    Special Requests   Final    NONE Performed at [facility], [address] [place], [place] [address]    Culture (A)  Final    <10,000 COLONIES/mL INSIGNIFICANT GROWTH Performed at [facility], [address] [place], [place] [address]    Report Status 11/06/2020 FINAL  Final  Resp Panel by RT-PCR (Flu A&B, Covid) Nasopharyngeal Swab     Status: None   Collection Time: 11/04/20  9:15 PM   Specimen: Nasopharyngeal Swab; Nasopharyngeal(NP) swabs in vial transport medium  Result Value Ref Range Status   SARS Coronavirus 2 by RT PCR NEGATIVE NEGATIVE Final    Comment: (NOTE) SARS-CoV-2 target nucleic acids are NOT DETECTED.  The SARS-CoV-2 RNA is generally detectable in upper respiratory specimens during the acute phase of infection. The lowest concentration of SARS-CoV-2 viral copies this assay can detect is 138 copies/mL. A negative result does not preclude SARS-Cov-2 infection and should not be used as the sole basis for treatment or other patient management decisions. A negative result may occur with  improper specimen collection/handling, submission of specimen other than nasopharyngeal swab,  presence of viral mutation(s) within the areas targeted by this assay, and inadequate number of viral copies(<138 copies/mL). A negative result must be combined with clinical observations, patient history, and epidemiological information. The expected result is Negative.  Fact Sheet for Patients:  BloggerCourse.com  Fact Sheet for Healthcare Providers:  SeriousBroker.it  This test is no t yet approved or cleared by the [place] and  has been authorized for  detection and/or diagnosis of SARS-CoV-2 by FDA under an Emergency Use Authorization (EUA). This EUA will remain  in effect (meaning this test can be used) for the duration of the COVID-19 declaration under Section 564(b)(1) of the Act, 21 U.S.C.section 360bbb-3(b)(1), unless the authorization is terminated  or revoked sooner.       Influenza A by PCR NEGATIVE NEGATIVE Final   Influenza B by PCR NEGATIVE NEGATIVE Final    Comment: (NOTE) The Xpert Xpress SARS-CoV-2/FLU/RSV plus assay is intended as an aid in the diagnosis of influenza from Nasopharyngeal swab specimens and should not be used as a sole basis for treatment. Nasal washings and aspirates are unacceptable for Xpert Xpress SARS-CoV-2/FLU/RSV testing.  Fact Sheet for Patients: BloggerCourse.com  Fact Sheet for Healthcare Providers: SeriousBroker.it  This test is not yet approved or cleared by the [place] FDA and has been authorized for detection and/or diagnosis of SARS-CoV-2 by FDA under an Emergency Use Authorization (EUA). This EUA will remain in effect (meaning this test can be used) for the duration of the COVID-19 declaration under Section 564(b)(1) of the Act, 21 U.S.C. section 360bbb-3(b)(1), unless the authorization is terminated or revoked.  Performed at [facility], [address], [place], [place] [address]   Blood  culture (routine single)     Status: None (Preliminary result)   Collection Time: 11/04/20 10:00 PM   Specimen: BLOOD  Result Value Ref Range Status   Specimen Description   Final    BLOOD RIGHT ANTECUBITAL Performed at [facility], [address] [place], [place] [address]    Special Requests   Final    BOTTLES DRAWN AEROBIC AND ANAEROBIC Blood Culture adequate volume Performed at [facility], [address] [place], [place] [address]    Culture   Final    NO GROWTH 2 DAYS Performed at [facility], [address], [place], [place] [address]    Report Status PENDING  Incomplete      Radiology Studies: EEG adult  Result Date: 25-Nov-2020 Charlsie Quest, MD     2020/11/25  2:13 PM Patient Name: ELONA YINGER MRN: [id number] Epilepsy Attending: Charlsie Quest Referring Physician/Provider: Dr Osvaldo Shipper Date: 2020-11-25 Duration: 23.25 mins Patient history: 80yo F with ams. EEG to evaluate for seizure Level of alertness: Awake AEDs during EEG study: None Technical aspects: This EEG study was done with scalp electrodes positioned according to the 10-20 International system of electrode placement. Electrical activity was acquired at a sampling rate of 500Hz  and reviewed with a high frequency filter of 70Hz  and a low frequency filter of 1Hz . EEG data were recorded continuously and digitally stored. Description: The posterior dominant rhythm consists of 7 Hz activity of moderate voltage (25-35 uV) seen predominantly in posterior head regions, symmetric and reactive to eye opening and eye closing. Hyperventilation and photic stimulation were not performed.   ABNORMALITY - Background slow IMPRESSION: This study is suggestive of mild diffuse encephalopathy, nonspecific etiology. No seizures or epileptiform discharges were seen throughout the recording. Priyanka       LOS: 3 days   Artem Bunte on  [email or web address]  11/08/2020, 9:32 AM

## 2020-11-09 DIAGNOSIS — I10 Essential (primary) hypertension: Secondary | ICD-10-CM | POA: Diagnosis not present

## 2020-11-09 LAB — COMPREHENSIVE METABOLIC PANEL
ALT: 5 U/L (ref 0–44)
AST: 21 U/L (ref 15–41)
Albumin: 3.5 g/dL (ref 3.5–5.0)
Alkaline Phosphatase: 42 U/L (ref 38–126)
Anion gap: 9 (ref 5–15)
BUN: 15 mg/dL (ref 8–23)
CO2: 22 mmol/L (ref 22–32)
Calcium: 8.8 mg/dL — ABNORMAL LOW (ref 8.9–10.3)
Chloride: 104 mmol/L (ref 98–111)
Creatinine, Ser: 0.88 mg/dL (ref 0.44–1.00)
GFR, Estimated: >60 mL/min (ref >60)
Glucose, Bld: 114 mg/dL — ABNORMAL HIGH (ref 70–99)
Potassium: 3.5 mmol/L (ref 3.5–5.1)
Sodium: 135 mmol/L (ref 135–145)
Total Bilirubin: 0.8 mg/dL (ref 0.3–1.2)
Total Protein: 6.1 g/dL — ABNORMAL LOW (ref 6.5–8.1)

## 2020-11-09 LAB — GLUCOSE, CAPILLARY
Glucose-Capillary: 100 mg/dL — ABNORMAL HIGH (ref 70–99)
Glucose-Capillary: 116 mg/dL — ABNORMAL HIGH (ref 70–99)
Glucose-Capillary: 121 mg/dL — ABNORMAL HIGH (ref 70–99)
Glucose-Capillary: 151 mg/dL — ABNORMAL HIGH (ref 70–99)
Glucose-Capillary: 188 mg/dL — ABNORMAL HIGH (ref 70–99)

## 2020-11-09 LAB — CBC
HCT: 27.2 % — ABNORMAL LOW (ref 36.0–46.0)
Hemoglobin: 8.9 g/dL — ABNORMAL LOW (ref 12.0–15.0)
MCH: 33 pg (ref 26.0–34.0)
MCHC: 32.7 g/dL (ref 30.0–36.0)
MCV: 100.7 fL — ABNORMAL HIGH (ref 80.0–100.0)
Platelets: 186 10*3/uL (ref 150–400)
RBC: 2.7 MIL/uL — ABNORMAL LOW (ref 3.87–5.11)
RDW: 13.1 % (ref 11.5–15.5)
WBC: 6.8 10*3/uL (ref 4.0–10.5)
nRBC: 0 % (ref 0.0–0.2)

## 2020-11-09 NOTE — Progress Notes (Signed)
PROGRESS NOTE    FREDIA CHITTENDEN  IFO:[id number] DOB: [birth year] DOA: 11/04/2020 PCP: [facility]   Brief Narrative:  This 80 y.o.femalewithhistory of dementia, chronic kidney disease stage III, diabetes mellitus, Parkinson's, hypertension was recently brought into the ER 2 days ago after patient had a fall and fractured her left shoulder. Patient was eventually discharged back to skilled nursing facility. Patient was found to be acutely confused at SNF.  She was brought back to the ER.  She is admitted for acute metabolic encephalopathy.  Assessment & Plan:   Principal Problem:   Acute encephalopathy Active Problems:   HTN (hypertension)   Type 2 diabetes mellitus (HCC)   Acute metabolic encephalopathy  Acute metabolic encephalopathy >>> Improving. Baseline mental status is not clearly known.  Reason for her current presentation is unclear.   UA was unremarkable.  Influenza and COVID-19 test were negative.   Chest x-ray did not show any pneumonia.  CT head did not show any acute findings.   MRI brain negative for acute stroke.  TSH normal at 2.6.  B12 low normal at 325.   Folate levels normal.  Ammonia level was normal at 10.  EEG did not show any epileptiform activity..  Daughter did reports that when the patient fell few days ago she might have hit her head against a box.   Quite possible that she may have had a concussion. It appears that her mentation is continuing to improve.  Appetite appears to be improving as well.   Continue IV fluids for now.  History of dementia Remains confused but seems to be at her baseline.  History of Parkinson's disease Sinemet was resumed yesterday.   Patient placed on a diet after being seen by speech therapy.    Diabetes mellitus type 2 CBGs are reasonably well controlled.   Continue SSI.  HbA1c 6.0.    Chronic kidney disease stage IIIa Stable.  Monitor urine output.  Essential hypertension Continue home blood pressure  medications.  Recent left shoulder fracture: Continue with shoulder immobilization.  Sling.  Pain control.  PT and OT evaluation.   Outpatient follow-up with orthopedics.   It looks like this case was discussed with Dr. Deeann Saint in [facility].  Macrocytic anemia Drop in hemoglobin is likely dilutional with some mild bleeding associated with her recent injury.   Hemoglobin remains stable . No obvious overt bleeding noted.  Anemia panel reviewed.  No clear-cut deficiencies identified.   B12 was low normal.  She was given 1 dose of IM injection.   May benefit from oral supplementation at discharge.    Goals of care Patient remains DNR.  To be improving daily.  Wait for improvement in oral intake as well.     DVT prophylaxis: Heparin sq Code Status: DNR Family Communication: No family at bed side. Disposition Plan:  Status is: Inpatient  Remains inpatient appropriate because:Inpatient level of care appropriate due to severity of illness   Dispo:  Patient From: Skilled Nursing Facility  Planned Disposition: Home with Health Care Svc  Expected discharge date: 11/10/2020  Medically stable for discharge: No     Consultants:   None.  Procedures:  None Antimicrobials:  Anti-infectives (From admission, onward)   None      Subjective: Patient was seen and examined at bedside.  Overnight events noted. She is very hard of hearing ,  Patient has dementia at baseline.  she is alert and oriented x1.  She denies any pain.  She also  reports feeling better.  Objective: Vitals:   11/08/20 1342 11/08/20 2037 11/09/20 0525 11/09/20 1300  BP: (!) 183/60 (!) 147/97 105/61 (!) 145/55  Pulse: 80 (!) 109 79 94  Resp: 18 16 18 16   Temp: 98.3 F (36.8 C) 98 F (36.7 C) 98.1 F (36.7 C) 99.2 F (37.3 C)  TempSrc: Oral Oral Oral Oral  SpO2: 98% 96% 98% 97%  Weight:      Height:        Intake/Output Summary (Last 24 hours) at 11/09/2020 1636 Last data filed at 11/09/2020  1301 Gross per 24 hour  Intake 600 ml  Output 800 ml  Net -200 ml   Filed Weights   11/06/20 0327  Weight: 48.4 kg    Examination:  General exam: Appears calm and comfortable , not in any distress, hard of hearing. Respiratory system: Clear to auscultation. Respiratory effort normal. Cardiovascular system: S1 & S2 heard, RRR. No JVD, murmurs, rubs, gallops or clicks. No pedal edema. Gastrointestinal system: Abdomen is nondistended, soft and nontender. No organomegaly or masses felt. Normal bowel sounds heard. Central nervous system: Alert and oriented. No focal neurological deficits. Extremities: Symmetric 5 x 5 power. No edema, No cyanosis, No clubbing Skin: No rashes, lesions or ulcers Psychiatry: Judgement and insight appear normal. Mood & affect appropriate.     Data Reviewed: I have personally reviewed following labs and imaging studies  CBC: Recent Labs  Lab 11/04/20 2200 11/05/20 0108 11/06/20 0608 11/07/20 0605 11/08/20 0635 11/09/20 0559  WBC 11.3* 10.9* 6.7 5.7 5.8 6.8  NEUTROABS 8.9*  --   --   --   --   --   HGB 11.1* 9.9* 8.7* 8.8* 8.9* 8.9*  HCT 33.6* 30.2* 27.5* 27.0* 28.0* 27.2*  MCV 101.2* 102.0* 105.8* 103.8* 103.7* 100.7*  PLT 146* 139* 135* 144* 153 186   Basic Metabolic Panel: Recent Labs  Lab 11/05/20 0450 11/06/20 0608 11/07/20 0605 11/08/20 0635 11/09/20 0559  NA 138 136 137 138 135  K 3.8 3.5 3.8 3.7 3.5  CL 102 103 105 106 104  CO2 26 22 22 23 22   GLUCOSE 132* 137* 113* 113* 114*  BUN 16 19 17 14 15   CREATININE 0.94 0.94 0.81 0.81 0.88  CALCIUM 9.3 8.4* 8.6* 8.6* 8.8*   GFR: Estimated Creatinine Clearance: 38.3 mL/min (by C-G formula based on SCr of 0.88 mg/dL). Liver Function Tests: Recent Labs  Lab 11/05/20 0450 11/06/20 0608 11/07/20 0605 11/08/20 0635 11/09/20 0559  AST 37 27 24 23 21   ALT 13 11 10  <5 5  ALKPHOS 43 35* 37* 39 42  BILITOT 0.8 0.7 1.0 0.7 0.8  PROT 6.8 5.7* 6.0* 5.8* 6.1*  ALBUMIN 3.9 3.2* 3.4* 3.4*  3.5   No results for input(s): LIPASE, AMYLASE in the last 168 hours. Recent Labs  Lab 11/05/20 1010  AMMONIA 10   Coagulation Profile: Recent Labs  Lab 11/04/20 2200  INR 1.1   Cardiac Enzymes: No results for input(s): CKTOTAL, CKMB, CKMBINDEX, TROPONINI in the last 168 hours. BNP (last 3 results) No results for input(s): PROBNP in the last 8760 hours. HbA1C: Recent Labs    11/07/20 0605  HGBA1C 6.0*   CBG: Recent Labs  Lab 11/08/20 2045 11/09/20 0011 11/09/20 0524 11/09/20 0811 11/09/20 1157  GLUCAP 197* 151* 100* 121* 188*   Lipid Profile: No results for input(s): CHOL, HDL, LDLCALC, TRIG, CHOLHDL, LDLDIRECT in the last 72 hours. Thyroid Function Tests: No results for input(s): TSH, T4TOTAL, FREET4, T3FREE, THYROIDAB  in the last 72 hours. Anemia Panel: No results for input(s): VITAMINB12, FOLATE, FERRITIN, TIBC, IRON, RETICCTPCT in the last 72 hours. Sepsis Labs: Recent Labs  Lab 11/04/20 2200  LATICACIDVEN 1.2    Recent Results (from the past 240 hour(s))  SARS Coronavirus 2 by RT PCR (hospital order, performed in [facility] hospital lab) Nasopharyngeal Nasopharyngeal Swab     Status: None   Collection Time: 11/02/20  1:58 PM   Specimen: Nasopharyngeal Swab  Result Value Ref Range Status   SARS Coronavirus 2 NEGATIVE NEGATIVE Final    Comment: (NOTE) SARS-CoV-2 target nucleic acids are NOT DETECTED.  The SARS-CoV-2 RNA is generally detectable in upper and lower respiratory specimens during the acute phase of infection. The lowest concentration of SARS-CoV-2 viral copies this assay can detect is 250 copies / mL. A negative result does not preclude SARS-CoV-2 infection and should not be used as the sole basis for treatment or other patient management decisions.  A negative result may occur with improper specimen collection / handling, submission of specimen other than nasopharyngeal swab, presence of viral mutation(s) within the areas targeted by  this assay, and inadequate number of viral copies (<250 copies / mL). A negative result must be combined with clinical observations, patient history, and epidemiological information.  Fact Sheet for Patients:   BoilerBrush.com.cy  Fact Sheet for Healthcare Providers: https://pope.com/  This test is not yet approved or  cleared by the [place] FDA and has been authorized for detection and/or diagnosis of SARS-CoV-2 by FDA under an Emergency Use Authorization (EUA).  This EUA will remain in effect (meaning this test can be used) for the duration of the COVID-19 declaration under Section 564(b)(1) of the Act, 21 U.S.C. section 360bbb-3(b)(1), unless the authorization is terminated or revoked sooner.  Performed at [facility], [address], [place], [place] [address]   Urine culture     Status: Abnormal   Collection Time: 11/04/20  9:10 PM   Specimen: In/Out Cath Urine  Result Value Ref Range Status   Specimen Description   Final    IN/OUT CATH URINE Performed at [facility], [address], [place], [place] [address]    Special Requests   Final    NONE Performed at [facility], [address] [place], [place] [address]    Culture (A)  Final    <10,000 COLONIES/mL INSIGNIFICANT GROWTH Performed at [facility], [address], [place], [place] [address]    Report Status 11/06/2020 FINAL  Final  Resp Panel by RT-PCR (Flu A&B, Covid) Nasopharyngeal Swab     Status: None   Collection Time: 11/04/20  9:15 PM   Specimen: Nasopharyngeal Swab; Nasopharyngeal(NP) swabs in vial transport medium  Result Value Ref Range Status   SARS Coronavirus 2 by RT PCR NEGATIVE NEGATIVE Final    Comment: (NOTE) SARS-CoV-2 target nucleic acids are NOT DETECTED.  The SARS-CoV-2 RNA is generally detectable in upper respiratory specimens during the acute phase of infection. The  lowest concentration of SARS-CoV-2 viral copies this assay can detect is 138 copies/mL. A negative result does not preclude SARS-Cov-2 infection and should not be used as the sole basis for treatment or other patient management decisions. A negative result may occur with  improper specimen collection/handling, submission of specimen other than nasopharyngeal swab, presence of viral mutation(s) within the areas targeted by this assay, and inadequate number of viral copies(<138 copies/mL). A negative result must be combined with clinical observations,  patient history, and epidemiological information. The expected result is Negative.  Fact Sheet for Patients:  BloggerCourse.comhttps://www.fda.gov/media/152166/download  Fact Sheet for Healthcare Providers:  SeriousBroker.ithttps://www.fda.gov/media/152162/download  This test is no t yet approved or cleared by the [place] FDA and  has been authorized for detection and/or diagnosis of SARS-CoV-2 by FDA under an Emergency Use Authorization (EUA). This EUA will remain  in effect (meaning this test can be used) for the duration of the COVID-19 declaration under Section 564(b)(1) of the Act, 21 U.S.C.section 360bbb-3(b)(1), unless the authorization is terminated  or revoked sooner.       Influenza A by PCR NEGATIVE NEGATIVE Final   Influenza B by PCR NEGATIVE NEGATIVE Final    Comment: (NOTE) The Xpert Xpress SARS-CoV-2/FLU/RSV plus assay is intended as an aid in the diagnosis of influenza from Nasopharyngeal swab specimens and should not be used as a sole basis for treatment. Nasal washings and aspirates are unacceptable for Xpert Xpress SARS-CoV-2/FLU/RSV testing.  Fact Sheet for Patients: BloggerCourse.comhttps://www.fda.gov/media/152166/download  Fact Sheet for Healthcare Providers: SeriousBroker.ithttps://www.fda.gov/media/152162/download  This test is not yet approved or cleared by the [place] FDA and has been authorized for detection and/or diagnosis of SARS-CoV-2 by FDA under  an Emergency Use Authorization (EUA). This EUA will remain in effect (meaning this test can be used) for the duration of the COVID-19 declaration under Section 564(b)(1) of the Act, 21 U.S.C. section 360bbb-3(b)(1), unless the authorization is terminated or revoked.  Performed at [facility], [address] [place], [place] [address]   Blood culture (routine single)     Status: None (Preliminary result)   Collection Time: 11/04/20 10:00 PM   Specimen: BLOOD  Result Value Ref Range Status   Specimen Description   Final    BLOOD RIGHT ANTECUBITAL Performed at [facility], [address] [place], [place] [address]    Special Requests   Final    BOTTLES DRAWN AEROBIC AND ANAEROBIC Blood Culture adequate volume Performed at [facility], [address] [place], [place] [address]    Culture   Final    NO GROWTH 3 DAYS Performed at [facility], [address] [place], [place] [address]    Report Status PENDING  Incomplete     Radiology Studies: No results found.  Scheduled Meds: . aspirin EC  81 mg Oral QPC supper  . Carbidopa-Levodopa ER  1 tablet Oral BID  . felodipine  5 mg Oral Daily  . gabapentin  100 mg Oral QHS  . heparin  5,000 Units Subcutaneous Q8H  . insulin aspart  0-6 Units Subcutaneous Q4H  . memantine  10 mg Oral BID  . mirtazapine  7.5 mg Oral QHS  . oxybutynin  10 mg Oral Daily   Continuous Infusions:   LOS: 4 days    Time spent: 25 mins    Cipriano BunkerPARDEEP Karee Forge, MD [facility]   If 7PM-7AM, please contact night-coverage

## 2020-11-10 DIAGNOSIS — I10 Essential (primary) hypertension: Secondary | ICD-10-CM | POA: Diagnosis not present

## 2020-11-10 LAB — COMPREHENSIVE METABOLIC PANEL
ALT: 5 U/L (ref 0–44)
AST: 28 U/L (ref 15–41)
Albumin: 3.5 g/dL (ref 3.5–5.0)
Alkaline Phosphatase: 39 U/L (ref 38–126)
Anion gap: 11 (ref 5–15)
BUN: 15 mg/dL (ref 8–23)
CO2: 21 mmol/L — ABNORMAL LOW (ref 22–32)
Calcium: 8.8 mg/dL — ABNORMAL LOW (ref 8.9–10.3)
Chloride: 106 mmol/L (ref 98–111)
Creatinine, Ser: 0.84 mg/dL (ref 0.44–1.00)
GFR, Estimated: >60 mL/min (ref >60)
Glucose, Bld: 108 mg/dL — ABNORMAL HIGH (ref 70–99)
Potassium: 3.7 mmol/L (ref 3.5–5.1)
Sodium: 138 mmol/L (ref 135–145)
Total Bilirubin: 1.1 mg/dL (ref 0.3–1.2)
Total Protein: 6 g/dL — ABNORMAL LOW (ref 6.5–8.1)

## 2020-11-10 LAB — CBC
HCT: 27 % — ABNORMAL LOW (ref 36.0–46.0)
Hemoglobin: 9.2 g/dL — ABNORMAL LOW (ref 12.0–15.0)
MCH: 33.8 pg (ref 26.0–34.0)
MCHC: 34.1 g/dL (ref 30.0–36.0)
MCV: 99.3 fL (ref 80.0–100.0)
Platelets: 166 10*3/uL (ref 150–400)
RBC: 2.72 MIL/uL — ABNORMAL LOW (ref 3.87–5.11)
RDW: 13.3 % (ref 11.5–15.5)
WBC: 6.2 10*3/uL (ref 4.0–10.5)
nRBC: 0 % (ref 0.0–0.2)

## 2020-11-10 LAB — PHOSPHORUS: Phosphorus: 2.8 mg/dL (ref 2.5–4.6)

## 2020-11-10 LAB — GLUCOSE, CAPILLARY
Glucose-Capillary: 117 mg/dL — ABNORMAL HIGH (ref 70–99)
Glucose-Capillary: 132 mg/dL — ABNORMAL HIGH (ref 70–99)
Glucose-Capillary: 138 mg/dL — ABNORMAL HIGH (ref 70–99)
Glucose-Capillary: 145 mg/dL — ABNORMAL HIGH (ref 70–99)
Glucose-Capillary: 200 mg/dL — ABNORMAL HIGH (ref 70–99)
Glucose-Capillary: 96 mg/dL (ref 70–99)

## 2020-11-10 LAB — CULTURE, BLOOD (SINGLE)
Culture: NO GROWTH
Special Requests: ADEQUATE

## 2020-11-10 LAB — MAGNESIUM: Magnesium: 1.8 mg/dL (ref 1.7–2.4)

## 2020-11-10 NOTE — Progress Notes (Signed)
PROGRESS NOTE    JACQELINE BROERS  IRW:[id number] DOB: 12/12/1940 DOA: 11/04/2020 PCP: [facility]   Brief Narrative:  This 80 y.o.femalewithhistory of dementia, chronic kidney disease stage III, diabetes mellitus, Parkinson's, hypertension was recently brought into the ER 2 days ago after patient had a fall and fractured her left shoulder. Patient was eventually discharged back to skilled nursing facility. Patient was found to be acutely confused at SNF.  She was brought back to the ER.  She is admitted for acute metabolic encephalopathy.  Assessment & Plan:   Principal Problem:   Acute encephalopathy Active Problems:   HTN (hypertension)   Type 2 diabetes mellitus (HCC)   Acute metabolic encephalopathy  Acute metabolic encephalopathy >>> Improving. Baseline mental status is not clearly known.  Reason for her current presentation is unclear.   UA was unremarkable.  Influenza and COVID-19 test were negative.   Chest x-ray did not show any pneumonia.  CT head did not show any acute findings.   MRI brain negative for acute stroke.  TSH normal at 2.6.  B12 low normal at 325.   Folate levels normal.  Ammonia level was normal at 10.  EEG did not show any epileptiform activity..  Daughter did reports that when the patient fell few days ago she might have hit her head against a box.   Quite possible that she may have had a concussion. It appears that her mentation is continuing to improve.  Appetite appears to be improving as well.   Continue IV fluids for now.  History of dementia Remains confused but seems to be at her baseline.  History of Parkinson's disease Sinemet was resumed on 2/12 Patient placed on a diet after being seen by speech therapy.    Diabetes mellitus type 2 CBGs are reasonably well controlled.   Continue SSI.  HbA1c 6.0.    Chronic kidney disease stage IIIa Stable.  Monitor urine output.  Essential hypertension Continue home blood pressure  medications.  Recent left shoulder fracture: Continue with shoulder immobilization.  Sling.  Pain control.  PT and OT evaluation.   Outpatient follow-up with orthopedics.   It looks like this case was discussed with Dr. Deeann Saint in [facility].  Macrocytic anemia Drop in hemoglobin is likely dilutional with some mild bleeding associated with her recent injury.   Hemoglobin remains stable . No obvious overt bleeding noted.  Anemia panel reviewed.  No clear-cut deficiencies identified.   B12 was low normal.  She was given 1 dose of IM injection.   May benefit from oral supplementation at discharge.    Goals of care Patient remains DNR. treat the treatable and discharge back to SNF.  DVT prophylaxis: Heparin sq Code Status: DNR Family Communication: No family at bed side. Disposition Plan:  Status is: Inpatient  Remains inpatient appropriate because:Inpatient level of care appropriate due to severity of illness   Dispo:  Patient From: Skilled Nursing Facility  Planned Disposition: Home with Health Care Svc  Expected discharge date: 11/11/2020  Medically stable for discharge: No     Consultants:   None.  Procedures:  None Antimicrobials:  Anti-infectives (From admission, onward)   None      Subjective: Patient was seen and examined at bedside. No overnight events noted. She is very hard of hearing ,  Patient has dementia at baseline.  she is alert and oriented x1.  She denies any pain.  She also reports feeling better.  Objective: Vitals:   11/09/20 1300  11/09/20 2001 11/10/20 0456 11/10/20 1357  BP: (!) 145/55 (!) 161/79 124/68 128/69  Pulse: 94 93 74 89  Resp: 16 16 16 14   Temp: 99.2 F (37.3 C) 98.3 F (36.8 C) (!) 97.5 F (36.4 C) 98.4 F (36.9 C)  TempSrc: Oral Oral Oral Oral  SpO2: 97% 98% 98% 97%  Weight:      Height:        Intake/Output Summary (Last 24 hours) at 11/10/2020 1539 Last data filed at 11/10/2020 0457 Gross per 24 hour   Intake 200 ml  Output 1350 ml  Net -1150 ml   Filed Weights   11/06/20 0327  Weight: 48.4 kg    Examination:  General exam: Appears calm and comfortable , not in any distress, hard of hearing. Respiratory system: Clear to auscultation. Respiratory effort normal. Cardiovascular system: S1 & S2 heard, RRR. No JVD, murmurs, rubs, gallops or clicks. No pedal edema. Gastrointestinal system: Abdomen is nondistended, soft and nontender. No organomegaly or masses felt. Normal bowel sounds heard. Central nervous system: Alert and oriented. No focal neurological deficits. Extremities:  No edema, No cyanosis, No clubbing Skin: No rashes, lesions or ulcers Psychiatry: Judgement and insight appear normal. Mood & affect appropriate.     Data Reviewed: I have personally reviewed following labs and imaging studies  CBC: Recent Labs  Lab 11/04/20 2200 11/05/20 0108 11/06/20 0608 11/07/20 0605 11/08/20 0635 11/09/20 0559 11/10/20 0649  WBC 11.3*   < > 6.7 5.7 5.8 6.8 6.2  NEUTROABS 8.9*  --   --   --   --   --   --   HGB 11.1*   < > 8.7* 8.8* 8.9* 8.9* 9.2*  HCT 33.6*   < > 27.5* 27.0* 28.0* 27.2* 27.0*  MCV 101.2*   < > 105.8* 103.8* 103.7* 100.7* 99.3  PLT 146*   < > 135* 144* 153 186 166   < > = values in this interval not displayed.   Basic Metabolic Panel: Recent Labs  Lab 11/06/20 0608 11/07/20 0605 11/08/20 0635 11/09/20 0559 11/10/20 0649  NA 136 137 138 135 138  K 3.5 3.8 3.7 3.5 3.7  CL 103 105 106 104 106  CO2 22 22 23 22  21*  GLUCOSE 137* 113* 113* 114* 108*  BUN 19 17 14 15 15   CREATININE 0.94 0.81 0.81 0.88 0.84  CALCIUM 8.4* 8.6* 8.6* 8.8* 8.8*  MG  --   --   --   --  1.8  PHOS  --   --   --   --  2.8   GFR: Estimated Creatinine Clearance: 40.1 mL/min (by C-G formula based on SCr of 0.84 mg/dL). Liver Function Tests: Recent Labs  Lab 11/06/20 0608 11/07/20 0605 11/08/20 0635 11/09/20 0559 11/10/20 0649  AST 27 24 23 21 28   ALT 11 10 5 5 5    ALKPHOS 35* 37* 39 42 39  BILITOT 0.7 1.0 0.7 0.8 1.1  PROT 5.7* 6.0* 5.8* 6.1* 6.0*  ALBUMIN 3.2* 3.4* 3.4* 3.5 3.5   No results for input(s): LIPASE, AMYLASE in the last 168 hours. Recent Labs  Lab 11/05/20 1010  AMMONIA 10   Coagulation Profile: Recent Labs  Lab 11/04/20 2200  INR 1.1   Cardiac Enzymes: No results for input(s): CKTOTAL, CKMB, CKMBINDEX, TROPONINI in the last 168 hours. BNP (last 3 results) No results for input(s): PROBNP in the last 8760 hours. HbA1C: No results for input(s): HGBA1C in the last 72 hours. CBG: Recent Labs  Lab 11/09/20 2003 11/10/20 0019 11/10/20 0453 11/10/20 0753 11/10/20 1241  GLUCAP 132* 200* 96 117* 138*   Lipid Profile: No results for input(s): CHOL, HDL, LDLCALC, TRIG, CHOLHDL, LDLDIRECT in the last 72 hours. Thyroid Function Tests: No results for input(s): TSH, T4TOTAL, FREET4, T3FREE, THYROIDAB in the last 72 hours. Anemia Panel: No results for input(s): VITAMINB12, FOLATE, FERRITIN, TIBC, IRON, RETICCTPCT in the last 72 hours. Sepsis Labs: Recent Labs  Lab 11/04/20 2200  LATICACIDVEN 1.2    Recent Results (from the past 240 hour(s))  SARS Coronavirus 2 by RT PCR (hospital order, performed in [facility] hospital lab) Nasopharyngeal Nasopharyngeal Swab     Status: None   Collection Time: 11/02/20  1:58 PM   Specimen: Nasopharyngeal Swab  Result Value Ref Range Status   SARS Coronavirus 2 NEGATIVE NEGATIVE Final    Comment: (NOTE) SARS-CoV-2 target nucleic acids are NOT DETECTED.  The SARS-CoV-2 RNA is generally detectable in upper and lower respiratory specimens during the acute phase of infection. The lowest concentration of SARS-CoV-2 viral copies this assay can detect is 250 copies / mL. A negative result does not preclude SARS-CoV-2 infection and should not be used as the sole basis for treatment or other patient management decisions.  A negative result may occur with improper specimen collection /  handling, submission of specimen other than nasopharyngeal swab, presence of viral mutation(s) within the areas targeted by this assay, and inadequate number of viral copies (<250 copies / mL). A negative result must be combined with clinical observations, patient history, and epidemiological information.  Fact Sheet for Patients:   BoilerBrush.com.cy  Fact Sheet for Healthcare Providers: https://pope.com/  This test is not yet approved or  cleared by the [place] FDA and has been authorized for detection and/or diagnosis of SARS-CoV-2 by FDA under an Emergency Use Authorization (EUA).  This EUA will remain in effect (meaning this test can be used) for the duration of the COVID-19 declaration under Section 564(b)(1) of the Act, 21 U.S.C. section 360bbb-3(b)(1), unless the authorization is terminated or revoked sooner.  Performed at [facility], [address], [place], [place] [address]   Urine culture     Status: Abnormal   Collection Time: 11/04/20  9:10 PM   Specimen: In/Out Cath Urine  Result Value Ref Range Status   Specimen Description   Final    IN/OUT CATH URINE Performed at [facility], [address], [place], [place] [address]    Special Requests   Final    NONE Performed at [facility], [address], [place], [place] [address]    Culture (A)  Final    <10,000 COLONIES/mL INSIGNIFICANT GROWTH Performed at [facility], [address] [place], [place] [address]    Report Status 11/06/2020 FINAL  Final  Resp Panel by RT-PCR (Flu A&B, Covid) Nasopharyngeal Swab     Status: None   Collection Time: 11/04/20  9:15 PM   Specimen: Nasopharyngeal Swab; Nasopharyngeal(NP) swabs in vial transport medium  Result Value Ref Range Status   SARS Coronavirus 2 by RT PCR NEGATIVE NEGATIVE Final    Comment: (NOTE) SARS-CoV-2 target nucleic acids are NOT  DETECTED.  The SARS-CoV-2 RNA is generally detectable in upper respiratory specimens during the acute phase of infection. The lowest concentration of SARS-CoV-2 viral copies this assay can detect is 138 copies/mL. A negative result does not preclude SARS-Cov-2 infection and should not be used as the sole basis for treatment  or other patient management decisions. A negative result may occur with  improper specimen collection/handling, submission of specimen other than nasopharyngeal swab, presence of viral mutation(s) within the areas targeted by this assay, and inadequate number of viral copies(<138 copies/mL). A negative result must be combined with clinical observations, patient history, and epidemiological information. The expected result is Negative.  Fact Sheet for Patients:  BloggerCourse.comhttps://www.fda.gov/media/152166/download  Fact Sheet for Healthcare Providers:  SeriousBroker.ithttps://www.fda.gov/media/152162/download  This test is no t yet approved or cleared by the [place] FDA and  has been authorized for detection and/or diagnosis of SARS-CoV-2 by FDA under an Emergency Use Authorization (EUA). This EUA will remain  in effect (meaning this test can be used) for the duration of the COVID-19 declaration under Section 564(b)(1) of the Act, 21 U.S.C.section 360bbb-3(b)(1), unless the authorization is terminated  or revoked sooner.       Influenza A by PCR NEGATIVE NEGATIVE Final   Influenza B by PCR NEGATIVE NEGATIVE Final    Comment: (NOTE) The Xpert Xpress SARS-CoV-2/FLU/RSV plus assay is intended as an aid in the diagnosis of influenza from Nasopharyngeal swab specimens and should not be used as a sole basis for treatment. Nasal washings and aspirates are unacceptable for Xpert Xpress SARS-CoV-2/FLU/RSV testing.  Fact Sheet for Patients: BloggerCourse.comhttps://www.fda.gov/media/152166/download  Fact Sheet for Healthcare Providers: SeriousBroker.ithttps://www.fda.gov/media/152162/download  This test is not yet  approved or cleared by the [place] FDA and has been authorized for detection and/or diagnosis of SARS-CoV-2 by FDA under an Emergency Use Authorization (EUA). This EUA will remain in effect (meaning this test can be used) for the duration of the COVID-19 declaration under Section 564(b)(1) of the Act, 21 U.S.C. section 360bbb-3(b)(1), unless the authorization is terminated or revoked.  Performed at [facility], [address] [place], [place] [address]   Blood culture (routine single)     Status: None   Collection Time: 11/04/20 10:00 PM   Specimen: BLOOD  Result Value Ref Range Status   Specimen Description   Final    BLOOD RIGHT ANTECUBITAL Performed at [facility], [address] [place], [place] [address]    Special Requests   Final    BOTTLES DRAWN AEROBIC AND ANAEROBIC Blood Culture adequate volume Performed at [facility], [address] [place], [place] [address]    Culture   Final    NO GROWTH 5 DAYS Performed at [facility], [address] [place], [place] [address]    Report Status 11/10/2020 FINAL  Final     Radiology Studies: No results found.  Scheduled Meds: . aspirin EC  81 mg Oral QPC supper  . Carbidopa-Levodopa ER  1 tablet Oral BID  . felodipine  5 mg Oral Daily  . gabapentin  100 mg Oral QHS  . heparin  5,000 Units Subcutaneous Q8H  . insulin aspart  0-6 Units Subcutaneous Q4H  . memantine  10 mg Oral BID  . mirtazapine  7.5 mg Oral QHS  . oxybutynin  10 mg Oral Daily   Continuous Infusions:   LOS: 5 days    Time spent: 25 mins    Cipriano BunkerPARDEEP Akeel Reffner, MD [facility]   If 7PM-7AM, please contact night-coverage

## 2020-11-10 NOTE — Progress Notes (Signed)
[facility] ([facility] Community Based Palliative Care       This patient has been referred to our palliative care services in the community.  [facility] will continue to follow for any discharge planning needs and to coordinate admission onto palliative care.    Thank you for the opportunity to participate in this patient's care.     Chrislyn King, BSN, RN [facility] Hospital Liaison   [phone] (24h on call) 

## 2020-11-10 NOTE — Care Management Important Message (Signed)
Important Message  Patient Details IM Letter given to the Patient. Name: Carrie Glenn MRN: [id number] Date of Birth: 11-May-1941   Medicare Important Message Given:  Yes     Caren Macadam 11/10/2020, 11:10 AM

## 2020-11-11 DIAGNOSIS — I10 Essential (primary) hypertension: Secondary | ICD-10-CM | POA: Diagnosis not present

## 2020-11-11 LAB — GLUCOSE, CAPILLARY
Glucose-Capillary: 120 mg/dL — ABNORMAL HIGH (ref 70–99)
Glucose-Capillary: 152 mg/dL — ABNORMAL HIGH (ref 70–99)
Glucose-Capillary: 157 mg/dL — ABNORMAL HIGH (ref 70–99)
Glucose-Capillary: 167 mg/dL — ABNORMAL HIGH (ref 70–99)
Glucose-Capillary: 167 mg/dL — ABNORMAL HIGH (ref 70–99)
Glucose-Capillary: 175 mg/dL — ABNORMAL HIGH (ref 70–99)
Glucose-Capillary: 87 mg/dL (ref 70–99)

## 2020-11-11 MED ORDER — ALUM & MAG HYDROXIDE-SIMETH 200-200-20 MG/5ML PO SUSP
30.0000 mL | ORAL | Status: DC | PRN
  Administered 2020-11-11: 30 mL via ORAL
  Filled 2020-11-11: qty 30

## 2020-11-11 NOTE — Progress Notes (Signed)
PT Cancellation Note  Patient Details Name: Carrie Glenn MRN: [id number] DOB: [birth year]   Cancelled Treatment:     pt currently in bed resting so did not disturbed. Pt has been evaluated and rec for SNF.  Will attempt to see another day.   Armando Reichert 11/11/2020, 3:17 PM

## 2020-11-11 NOTE — Progress Notes (Addendum)
PROGRESS NOTE    Carrie Glenn  ZOX:[id number] DOB: 08/29/1941 DOA: 11/04/2020 [facility]   Brief Narrative:  This 80 y.o.femalewithhistory of dementia, chronic kidney disease stage III, diabetes mellitus, Parkinson's, hypertension, hard of hearing was recently brought into the ER 2 days ago after patient had a fall and fractured her left shoulder. Patient was eventually discharged back to skilled nursing facility. Patient was found to be acutely confused at SNF.  She was brought back to the ER.  She is admitted for acute metabolic encephalopathy.  Assessment & Plan:   Principal Problem:   Acute encephalopathy Active Problems:   HTN (hypertension)   Type 2 diabetes mellitus (HCC)   Acute metabolic encephalopathy  Acute metabolic encephalopathy >>> Improving. Baseline mental status is not clearly known.  Reason for her current presentation is unclear.   UA was unremarkable.  Influenza and COVID-19 test were negative.   Chest x-ray did not show any pneumonia.  CT head did not show any acute findings.   MRI brain negative for acute stroke.  TSH normal at 2.6.  B12 low normal at 325.   Folate levels normal.  Ammonia level was normal at 10.  EEG did not show any epileptiform activity..  Daughter did reports that when the patient fell few days ago she might have hit her head against a box.   Quite possible that she may have had a concussion. It appears that her mentation is continuing to improve.  Appetite appears to be improving as well.   Continue IV fluids for now.  History of dementia Remains confused but seems to be at her baseline.  History of Parkinson's disease Sinemet was resumed on 2/12 Patient placed on a diet after being seen by speech therapy.    Diabetes mellitus type 2 CBGs are reasonably well controlled.   Continue SSI.  HbA1c 6.0.    Chronic kidney disease stage IIIa Stable.  Monitor urine output.  Essential hypertension Continue home  blood pressure medications.  Recent left shoulder fracture: Continue with shoulder immobilization.  Sling.  Pain control.  PT and OT evaluation.   Outpatient follow-up with orthopedics.   It looks like this case was [place] [facility] [place] [facility] [place].  Macrocytic anemia Drop in hemoglobin is likely dilutional with some mild bleeding associated with her recent injury.   Hemoglobin remains stable . No obvious overt bleeding noted.  Anemia panel reviewed.  No clear-cut deficiencies identified.   B12 was low normal.  She was given 1 dose of IM injection.   May benefit from oral supplementation at discharge.    Goals of care Patient remains DNR. treat the treatable and discharge back to SNF.  DVT prophylaxis: Heparin sq Code Status: DNR Family Communication: spoke with daughter at bed side. Disposition Plan:  Status is: Inpatient  Remains inpatient appropriate because:Inpatient level of care appropriate due to severity of illness   Dispo:  Patient From: Skilled Nursing Facility  Planned Disposition: SNF  Expected discharge date:  1-2 days             Medically stable for discharge: No     Consultants:   None.  Procedures:  None Antimicrobials:  Anti-infectives (From admission, onward)   None      Subjective: Patient was seen and examined at bedside. No overnight events noted. She is very hard of hearing ,  Patient has dementia at baseline.  She is alert and oriented x1.  She denies any pain.  She also reports feeling better. Daughter is at bed side, states she is not at her baseline, she is confused, explained to her that we have found no reason for her confusion, it could be progression of dementia.  Objective: Vitals:   11/10/20 1357 11/10/20 2017 11/11/20 0437 11/11/20 1427  BP: 128/69 (!) 145/86 (!) 141/81 (!) 106/54  Pulse: 89 90 90 89  Resp: 14 20 20 16   Temp: 98.4 F (36.9 C) 97.6 F (36.4 C) 98.1 F (36.7 C) 98.2 F (36.8 C)   TempSrc: Oral Oral Oral Oral  SpO2: 97% 97% 96% 97%  Weight:      Height:        Intake/Output Summary (Last 24 hours) at 11/11/2020 1635 Last data filed at 11/11/2020 1300 Gross per 24 hour  Intake 236 ml  Output 400 ml  Net -164 ml   Filed Weights   11/06/20 0327  Weight: 48.4 kg    Examination:  General exam: Appears calm and comfortable , not in any distress, hard of hearing. Respiratory system: Clear to auscultation. Respiratory effort normal. Cardiovascular system: S1 & S2 heard, RRR. No JVD, murmurs, rubs, gallops or clicks. No pedal edema. Gastrointestinal system: Abdomen is nondistended, soft and nontender. No organomegaly or masses felt. Normal bowel sounds heard. Central nervous system: Alert and oriented. No focal neurological deficits. Extremities:  No edema, No cyanosis, No clubbing Skin: No rashes, lesions or ulcers Psychiatry: Judgement and insight appear normal. Mood & affect appropriate.     Data Reviewed: I have personally reviewed following labs and imaging studies  CBC: Recent Labs  Lab 11/04/20 2200 11/05/20 0108 11/06/20 0608 11/07/20 0605 11/08/20 0635 11/09/20 0559 11/10/20 0649  WBC 11.3*   < > 6.7 5.7 5.8 6.8 6.2  NEUTROABS 8.9*  --   --   --   --   --   --   HGB 11.1*   < > 8.7* 8.8* 8.9* 8.9* 9.2*  HCT 33.6*   < > 27.5* 27.0* 28.0* 27.2* 27.0*  MCV 101.2*   < > 105.8* 103.8* 103.7* 100.7* 99.3  PLT 146*   < > 135* 144* 153 186 166   < > = values in this interval not displayed.   Basic Metabolic Panel: Recent Labs  Lab 11/06/20 0608 11/07/20 0605 11/08/20 0635 11/09/20 0559 11/10/20 0649  NA 136 137 138 135 138  K 3.5 3.8 3.7 3.5 3.7  CL 103 105 106 104 106  CO2 22 22 23 22  21*  GLUCOSE 137* 113* 113* 114* 108*  BUN 19 17 14 15 15   CREATININE 0.94 0.81 0.81 0.88 0.84  CALCIUM 8.4* 8.6* 8.6* 8.8* 8.8*  MG  --   --   --   --  1.8  PHOS  --   --   --   --  2.8   GFR: Estimated Creatinine Clearance: 40.1 mL/min (by C-G  formula based on SCr of 0.84 mg/dL). Liver Function Tests: Recent Labs  Lab 11/06/20 0608 11/07/20 0605 11/08/20 0635 11/09/20 0559 11/10/20 0649  AST 27 24 23 21 28   ALT 11 10 5 5 5   ALKPHOS 35* 37* 39 42 39  BILITOT 0.7 1.0 0.7 0.8 1.1  PROT 5.7* 6.0* 5.8* 6.1* 6.0*  ALBUMIN 3.2* 3.4* 3.4* 3.5 3.5   No results for input(s): LIPASE, AMYLASE in the last 168 hours. Recent Labs  Lab 11/05/20 1010  AMMONIA 10   Coagulation Profile: Recent Labs  Lab 11/04/20 2200  INR 1.1  Cardiac Enzymes: No results for input(s): CKTOTAL, CKMB, CKMBINDEX, TROPONINI in the last 168 hours. BNP (last 3 results) No results for input(s): PROBNP in the last 8760 hours. HbA1C: No results for input(s): HGBA1C in the last 72 hours. CBG: Recent Labs  Lab 11/10/20 2020 11/10/20 2358 11/11/20 0439 11/11/20 0729 11/11/20 1229  GLUCAP 167* 120* 157* 152* 167*   Lipid Profile: No results for input(s): CHOL, HDL, LDLCALC, TRIG, CHOLHDL, LDLDIRECT in the last 72 hours. Thyroid Function Tests: No results for input(s): TSH, T4TOTAL, FREET4, T3FREE, THYROIDAB in the last 72 hours. Anemia Panel: No results for input(s): VITAMINB12, FOLATE, FERRITIN, TIBC, IRON, RETICCTPCT in the last 72 hours. Sepsis Labs: Recent Labs  Lab 11/04/20 2200  LATICACIDVEN 1.2    Recent Results (from the past 240 hour(s))  SARS Coronavirus 2 by RT PCR (hospital order, performed in [facility] hospital lab) Nasopharyngeal Nasopharyngeal Swab     Status: None   Collection Time: 11/02/20  1:58 PM   Specimen: Nasopharyngeal Swab  Result Value Ref Range Status   SARS Coronavirus 2 NEGATIVE NEGATIVE Final    Comment: (NOTE) SARS-CoV-2 target nucleic acids are NOT DETECTED.  The SARS-CoV-2 RNA is generally detectable in upper and lower respiratory specimens during the acute phase of infection. The lowest concentration of SARS-CoV-2 viral copies this assay can detect is 250 copies / mL. A negative result does not  preclude SARS-CoV-2 infection and should not be used as the sole basis for treatment or other patient management decisions.  A negative result may occur with improper specimen collection / handling, submission of specimen other than nasopharyngeal swab, presence of viral mutation(s) within the areas targeted by this assay, and inadequate number of viral copies (<250 copies / mL). A negative result must be combined with clinical observations, patient history, and epidemiological information.  Fact Sheet for Patients:   BoilerBrush.com.cy  Fact Sheet for Healthcare Providers: https://pope.com/  This test is not yet approved or  cleared by the [place] FDA and has been authorized for detection and/or diagnosis of SARS-CoV-2 by FDA under an Emergency Use Authorization (EUA).  This EUA will remain in effect (meaning this test can be used) for the duration of the COVID-19 declaration under Section 564(b)(1) of the Act, 21 U.S.C. section 360bbb-3(b)(1), unless the authorization is terminated or revoked sooner.  Performed at [facility], [address], [place], [place] [address]   Urine culture     Status: Abnormal   Collection Time: 11/04/20  9:10 PM   Specimen: In/Out Cath Urine  Result Value Ref Range Status   Specimen Description   Final    IN/OUT CATH URINE Performed at [facility], [address] [place], [place] [address]    Special Requests   Final    NONE Performed at [facility], [address] [place], [place] [address]    Culture (A)  Final    <10,000 COLONIES/mL INSIGNIFICANT GROWTH Performed at [facility], [address], [place], [place] [address]    Report Status 11/06/2020 FINAL  Final  Resp Panel by RT-PCR (Flu A&B, Covid) Nasopharyngeal Swab     Status: None   Collection Time: 11/04/20  9:15 PM   Specimen: Nasopharyngeal Swab; Nasopharyngeal(NP)  swabs in vial transport medium  Result Value Ref Range Status   SARS Coronavirus 2 by RT PCR NEGATIVE NEGATIVE Final    Comment: (NOTE) SARS-CoV-2 target nucleic acids are NOT DETECTED.  The SARS-CoV-2 RNA is generally  detectable in upper respiratory specimens during the acute phase of infection. The lowest concentration of SARS-CoV-2 viral copies this assay can detect is 138 copies/mL. A negative result does not preclude SARS-Cov-2 infection and should not be used as the sole basis for treatment or other patient management decisions. A negative result may occur with  improper specimen collection/handling, submission of specimen other than nasopharyngeal swab, presence of viral mutation(s) within the areas targeted by this assay, and inadequate number of viral copies(<138 copies/mL). A negative result must be combined with clinical observations, patient history, and epidemiological information. The expected result is Negative.  Fact Sheet for Patients:  BloggerCourse.com  Fact Sheet for Healthcare Providers:  SeriousBroker.it  This test is no t yet approved or cleared by the [place] FDA and  has been authorized for detection and/or diagnosis of SARS-CoV-2 by FDA under an Emergency Use Authorization (EUA). This EUA will remain  in effect (meaning this test can be used) for the duration of the COVID-19 declaration under Section 564(b)(1) of the Act, 21 U.S.C.section 360bbb-3(b)(1), unless the authorization is terminated  or revoked sooner.       Influenza A by PCR NEGATIVE NEGATIVE Final   Influenza B by PCR NEGATIVE NEGATIVE Final    Comment: (NOTE) The Xpert Xpress SARS-CoV-2/FLU/RSV plus assay is intended as an aid in the diagnosis of influenza from Nasopharyngeal swab specimens and should not be used as a sole basis for treatment. Nasal washings and aspirates are unacceptable for Xpert Xpress  SARS-CoV-2/FLU/RSV testing.  Fact Sheet for Patients: BloggerCourse.com  Fact Sheet for Healthcare Providers: SeriousBroker.it  This test is not yet approved or cleared by the [place] FDA and has been authorized for detection and/or diagnosis of SARS-CoV-2 by FDA under an Emergency Use Authorization (EUA). This EUA will remain in effect (meaning this test can be used) for the duration of the COVID-19 declaration under Section 564(b)(1) of the Act, 21 U.S.C. section 360bbb-3(b)(1), unless the authorization is terminated or revoked.  Performed at [facility], [address], [place], [place] [address]   Blood culture (routine single)     Status: None   Collection Time: 11/04/20 10:00 PM   Specimen: BLOOD  Result Value Ref Range Status   Specimen Description   Final    BLOOD RIGHT ANTECUBITAL Performed at [facility], [address], [place], [place] [address]    Special Requests   Final    BOTTLES DRAWN AEROBIC AND ANAEROBIC Blood Culture adequate volume Performed at [facility], [address] [place], [place] [address]    Culture   Final    NO GROWTH 5 DAYS Performed at [facility], [address] [place], [place] [address]    Report Status 11/10/2020 FINAL  Final     Radiology Studies: No results found.  Scheduled Meds: . aspirin EC  81 mg Oral QPC supper  . Carbidopa-Levodopa ER  1 tablet Oral BID  . felodipine  5 mg Oral Daily  . gabapentin  100 mg Oral QHS  . heparin  5,000 Units Subcutaneous Q8H  . insulin aspart  0-6 Units Subcutaneous Q4H  . memantine  10 mg Oral BID  . mirtazapine  7.5 mg Oral QHS  . oxybutynin  10 mg Oral Daily   Continuous Infusions:   LOS: 6 days    Time spent: 25 mins    Cipriano Bunker, MD [facility]   If 7PM-7AM, please contact night-coverage

## 2020-11-12 ENCOUNTER — Inpatient Hospital Stay (HOSPITAL_COMMUNITY): Payer: Medicare Other

## 2020-11-12 DIAGNOSIS — F0280 Dementia in other diseases classified elsewhere without behavioral disturbance: Secondary | ICD-10-CM

## 2020-11-12 DIAGNOSIS — G20 Parkinson's disease: Secondary | ICD-10-CM

## 2020-11-12 DIAGNOSIS — D649 Anemia, unspecified: Secondary | ICD-10-CM

## 2020-11-12 DIAGNOSIS — E1165 Type 2 diabetes mellitus with hyperglycemia: Secondary | ICD-10-CM | POA: Diagnosis not present

## 2020-11-12 DIAGNOSIS — F0390 Unspecified dementia without behavioral disturbance: Secondary | ICD-10-CM

## 2020-11-12 DIAGNOSIS — I10 Essential (primary) hypertension: Secondary | ICD-10-CM | POA: Diagnosis not present

## 2020-11-12 DIAGNOSIS — R5381 Other malaise: Secondary | ICD-10-CM

## 2020-11-12 DIAGNOSIS — R1312 Dysphagia, oropharyngeal phase: Secondary | ICD-10-CM

## 2020-11-12 LAB — BASIC METABOLIC PANEL
Anion gap: 11 (ref 5–15)
BUN: 29 mg/dL — ABNORMAL HIGH (ref 8–23)
CO2: 23 mmol/L (ref 22–32)
Calcium: 9.4 mg/dL (ref 8.9–10.3)
Chloride: 108 mmol/L (ref 98–111)
Creatinine, Ser: 0.73 mg/dL (ref 0.44–1.00)
GFR, Estimated: >60 mL/min (ref >60)
Glucose, Bld: 135 mg/dL — ABNORMAL HIGH (ref 70–99)
Potassium: 3.6 mmol/L (ref 3.5–5.1)
Sodium: 142 mmol/L (ref 135–145)

## 2020-11-12 LAB — CBC
HCT: 28.1 % — ABNORMAL LOW (ref 36.0–46.0)
Hemoglobin: 9 g/dL — ABNORMAL LOW (ref 12.0–15.0)
MCH: 33.6 pg (ref 26.0–34.0)
MCHC: 32 g/dL (ref 30.0–36.0)
MCV: 104.9 fL — ABNORMAL HIGH (ref 80.0–100.0)
Platelets: 226 10*3/uL (ref 150–400)
RBC: 2.68 MIL/uL — ABNORMAL LOW (ref 3.87–5.11)
RDW: 13.8 % (ref 11.5–15.5)
WBC: 10.6 10*3/uL — ABNORMAL HIGH (ref 4.0–10.5)
nRBC: 0 % (ref 0.0–0.2)

## 2020-11-12 LAB — GLUCOSE, CAPILLARY
Glucose-Capillary: 157 mg/dL — ABNORMAL HIGH (ref 70–99)
Glucose-Capillary: 116 mg/dL — ABNORMAL HIGH (ref 70–99)
Glucose-Capillary: 142 mg/dL — ABNORMAL HIGH (ref 70–99)
Glucose-Capillary: 222 mg/dL — ABNORMAL HIGH (ref 70–99)
Glucose-Capillary: 123 mg/dL — ABNORMAL HIGH (ref 70–99)

## 2020-11-12 LAB — PHOSPHORUS: Phosphorus: 2.9 mg/dL (ref 2.5–4.6)

## 2020-11-12 LAB — MAGNESIUM: Magnesium: 2.2 mg/dL (ref 1.7–2.4)

## 2020-11-12 MED ORDER — OXYCODONE HCL 5 MG PO TABS: 5.0000 mg | ORAL_TABLET | Freq: Three times a day (TID) | ORAL | 0 refills | Status: AC | PRN

## 2020-11-12 MED ORDER — ACETAMINOPHEN 500 MG PO TABS: 500.0000 mg | ORAL_TABLET | Freq: Three times a day (TID) | ORAL | Status: AC

## 2020-11-12 MED ORDER — SENNOSIDES-DOCUSATE SODIUM 8.6-50 MG PO TABS: 1.0000 | ORAL_TABLET | Freq: Two times a day (BID) | ORAL | 0 refills | Status: DC | PRN

## 2020-11-12 NOTE — Discharge Summary (Signed)
Physician Discharge Summary  Carrie Glenn WUJ:[id number] DOB: Dec 06, 1940 DOA: 11/04/2020  PCP: [facility]  Admit date: 11/04/2020 Discharge date: 11/12/2020  Admitted From: SNF Disposition: Home  Recommendations for Outpatient Follow-up:  1. Follow ups as below. 2. Ambulatory referral to palliative medicine ordered 3. Please obtain CBC/BMP/Mag at follow up 4. Please follow up on the following pending results: None  Home Health: PT/OT/SLP/Aide Equipment/Devices: Light wheelchair  Discharge Condition: Stable CODE STATUS: DNR/DNI   Follow-up Information    Deeann Saint, MD. Schedule an appointment as soon as possible for a visit in 1 week(s).   Specialty: Orthopedic Surgery Why: for left shoulder fracture Contact information: [address] [place] [address] [phone]        [facility] Schedule an appointment as soon as possible for a visit in 1 week(s).   Contact information: [address] [place] [address] [phone]               Hospital Course: 80 year old F with history of dementia, CKD-3, DM-2, Parkinson disease, HTN, impaired hearing and recent fall with left shoulder fracture admitted from SNF with acute encephalopathy likely from polypharmacy and possible delirium.  She had extensive work-up including CT head, MRI brain, EEG, TSH, B12, folate level, CXR, cultures, ammonia...which were unrevealing.  Eventually, encephalopathy improved after discontinuing sedating medications.  She was evaluated by therapy who recommended SNF but patient's daughter opted to take patient home with home health and DME.  We discontinued his Xanax and Norco.  With scheduled Tylenol with low-dose oxycodone as needed for severe and breakthrough pain.  She is also on low-dose Flexeril.  Patient to remain in the sling until she follow-up with orthopedic surgery as previously planned for her left shoulder fracture.  Ambulatory referral to  palliative medicine ordered as well.  Plan discussed with patient's daughter over the phone on the day of discharge.  See individual problem list below for more on hospital course.  Discharge Diagnoses:  Acute metabolic encephalopathy: Likely from polypharmacy.  Improved.  Awake and alert.  Oriented to self and "hospital".  Follows commands appropriately.  No distress or agitation.  Unrevealing work-up as above. -Adjusted medications-discontinued Ativan and Norco.   -Scheduled Tylenol with oxycodone as needed for severe pain.  Dementia without behavioral disturbance Parkinson's disease -Continue home medications.  Controlled NIDDM-2: A1c 6.0%. -Liberated diet -May consider discontinuing home Januvia and Crestor.  Chronic kidney disease stage IIIa: Stable  Essential hypertension: Normotensive -Continue home medications  Recent fall/recent left shoulder fracture: -Continue sling until follow-up with ortho, Dr. Hyacinth Meeker as previously planned -PT/OT  Dysphagia-SLP recommended dysphagia 3 diet -Aspiration precaution provided  Macrocytic anemia: Stable.: Stable Recent Labs    11/02/20 0928 11/04/20 2200 11/05/20 0108 11/06/20 0608 11/07/20 0605 11/08/20 0635 11/09/20 0559 11/10/20 0649 11/12/20 0554  HGB 11.2* 11.1* 9.9* 8.7* 8.8* 8.9* 8.9* 9.2* 9.0*  -Recheck CBC at follow-up  Goals of care/DNR/DNI-appropriate -Ambulatory referral to palliative medicine ordered.   Body mass index is 16.71 kg/m.            Discharge Exam: Vitals:   11/11/20 2147 11/12/20 0534  BP: 138/79 125/73  Pulse: 86 80  Resp: 16 16  Temp: 98.3 F (36.8 C) 97.8 F (36.6 C)  SpO2: 98% 95%    GENERAL: No apparent distress.  Nontoxic. HEENT: MMM.  Vision grossly intact. NECK: Supple.  No apparent JVD.  RESP: On RA.  No IWOB.  Fair aeration bilaterally. CVS:  RRR.  Heart sounds normal.  ABD/GI/GU: Bowel sounds present. Soft. Non tender.  MSK/EXT:  Moves lower extremities and  right extremity.  Left arm in sling.  Notable anteriorly displaced fracture of proximal femur.  Bruising over left shoulder. SKIN: no apparent skin lesion or wound NEURO: Awake and alert.  Oriented to self and "hospital".  No apparent focal neuro deficit. PSYCH: Calm. Normal affect.  Discharge Instructions  Discharge Instructions    Amb Referral to Palliative Care   Complete by: As directed    Call MD for:  difficulty breathing, headache or visual disturbances   Complete by: As directed    Call MD for:  severe uncontrolled pain   Complete by: As directed    Call MD for:  temperature >100.4   Complete by: As directed    Diet general   Complete by: As directed    Dysphagia 3 (Mech soft);Thin liquid   Liquid Administration via: Straw Medication Administration: Whole meds with puree Supervision: assist with self feeding Compensations: Slow rate;Small sips/bites Postural Changes: Seated upright at 90 degrees;Remain upright for at least 30 minutes after po intake   Discharge instructions   Complete by: As directed    It has been a pleasure taking care of you!  You were hospitalized due to altered mental status.  After an extensive work-up, the cause is still unclear but your mental status improved.  However, you are on multiple sedating medication could contribute.  It could also be transient mental status change or delirium which could happen because of medication or pain.  We made some adjustments to your medication during this hospitalization.  Please review your new medication list and the directions on your medications before you take them.  Please follow-up with your primary care doctor in 1 to 2 weeks.  Also call to schedule a follow-up appointment with orthopedic surgeon in about a week.  Meanwhile, continue using the sling.    Take care,   Increase activity slowly   Complete by: As directed      Allergies as of 11/12/2020      Reactions   Amlodipine Swelling   Erythromycin  Nausea Only   Lexapro [escitalopram Oxalate] Nausea Only   Lipitor [atorvastatin] Nausea And Vomiting   Macrodantin [nitrofurantoin Macrocrystal] Nausea Only   Nitrofurantoin Nausea Only   Sertraline Hcl Other (See Comments)   Patients gets jittery.   Trazodone And Nefazodone Other (See Comments)   Patient gets insomnia.   Amoxicillin Diarrhea, Nausea And Vomiting, Nausea Only, Rash   Azithromycin Rash   Effexor [venlafaxine] Rash   Metformin And Related Itching, Rash   Penicillins Rash      Medication List    STOP taking these medications   AIRBORNE PO   ALPRAZolam 0.5 MG tablet Commonly known as: XANAX   HYDROcodone-acetaminophen 7.5-325 MG tablet Commonly known as: NORCO   olmesartan 20 MG tablet Commonly known as: BENICAR   spironolactone 50 MG tablet Commonly known as: ALDACTONE     TAKE these medications   acetaminophen 500 MG tablet Commonly known as: TYLENOL Take 1 tablet (500 mg total) by mouth every 8 (eight) hours.   aspirin EC 81 MG tablet Take 81 mg by mouth daily after supper.   Calcium Carbonate-Vitamin D3 600-400 MG-UNIT Tabs Take 1 tablet by mouth daily.   Carbidopa-Levodopa ER 25-100 MG tablet controlled release Commonly known as: SINEMET CR Take 1 tablet by mouth 2 (two) times daily.   cyclobenzaprine 5 MG tablet Commonly known as: FLEXERIL  Take 5 mg by mouth at bedtime as needed for muscle spasms.   docusate sodium 250 MG capsule Commonly known as: COLACE Take 250 mg by mouth 2 (two) times daily.   felodipine 5 MG 24 hr tablet Commonly known as: PLENDIL Take 5 mg by mouth daily.   fluticasone 50 MCG/ACT nasal spray Commonly known as: FLONASE Place 1 spray into both nostrils daily as needed for allergies or rhinitis.   gabapentin 100 MG capsule Commonly known as: NEURONTIN Take 100 mg by mouth at bedtime.   iron polysaccharides 150 MG capsule Commonly known as: NIFEREX Take 150 mg by mouth daily.   memantine 10 MG  tablet Commonly known as: NAMENDA Take 10 mg by mouth 2 (two) times daily.   mirtazapine 7.5 MG tablet Commonly known as: REMERON Take 7.5 mg by mouth at bedtime.   multivitamin with minerals Tabs tablet Take 1 tablet by mouth daily.   omeprazole 20 MG capsule Commonly known as: PRILOSEC Take 20 mg by mouth daily.   oxybutynin 10 MG 24 hr tablet Commonly known as: DITROPAN-XL Take 10 mg by mouth daily.   oxyCODONE 5 MG immediate release tablet Commonly known as: Roxicodone Take 1 tablet (5 mg total) by mouth every 8 (eight) hours as needed for up to 5 days for severe pain or breakthrough pain.   rosuvastatin 5 MG tablet Commonly known as: CRESTOR Take 5 mg by mouth daily.   senna-docusate 8.6-50 MG tablet Commonly known as: Senokot-S Take 1 tablet by mouth 2 (two) times daily between meals as needed for moderate constipation.   sitaGLIPtin 25 MG tablet Commonly known as: JANUVIA Take 25 mg by mouth daily after supper.   Vitamin D 50 MCG (2000 UT) Caps Take 2,000 Units by mouth daily.            Durable Medical Equipment  (From admission, onward)         Start     Ordered   11/07/20 1137  For home use only DME lightweight manual wheelchair with seat cushion  Once       Comments: Patient suffers from weakness which impairs their ability to perform daily activities like walking in the home.  A Vandrunen will not resolve  issue with performing activities of daily living. A wheelchair will allow patient to safely perform daily activities. Patient is not able to propel themselves in the home using a standard weight wheelchair due to weakness. Patient can self propel in the lightweight wheelchair. Length of need lifetime. Accessories: elevating leg rests (ELRs), wheel locks, extensions and anti-tippers.   11/07/20 1145          Consultations:  None  Procedures/Studies:   CT Head Wo Contrast  Result Date: 11/04/2020 CLINICAL DATA:  Mental status change EXAM: CT  HEAD WITHOUT CONTRAST TECHNIQUE: Contiguous axial images were obtained from the base of the skull through the vertex without intravenous contrast. COMPARISON:  None. FINDINGS: Brain: No evidence of acute territorial infarction, hemorrhage, hydrocephalus,extra-axial collection or mass lesion/mass effect. There is dilatation the ventricles and sulci consistent with age-related atrophy. Low-attenuation changes in the deep white matter consistent with small vessel ischemia. Vascular: No hyperdense vessel or unexpected calcification. Skull: The skull is intact. No fracture or focal lesion identified. Sinuses/Orbits: The visualized paranasal sinuses and mastoid air cells are clear. The orbits and globes intact. Other: None IMPRESSION: No acute intracranial abnormality. Findings consistent with age related atrophy and chronic small vessel ischemia Electronically Signed   By: Kandis Fantasia  Avutu M.D.   On: 11/04/2020 23:36   CT Head Wo Contrast  Result Date: 11/02/2020 CLINICAL DATA:  Fall. EXAM: CT HEAD WITHOUT CONTRAST CT CERVICAL SPINE WITHOUT CONTRAST TECHNIQUE: Multidetector CT imaging of the head and cervical spine was performed following the standard protocol without intravenous contrast. Multiplanar CT image reconstructions of the cervical spine were also generated. COMPARISON:  October 04, 2017.  October 09, 2016. FINDINGS: CT HEAD FINDINGS Brain: Mild diffuse cortical atrophy is noted. Mild chronic ischemic white matter disease is noted. No mass effect or midline shift is noted. Ventricular size is within normal limits. There is no evidence of mass lesion, hemorrhage or acute infarction. Vascular: No hyperdense vessel or unexpected calcification. Skull: Normal. Negative for fracture or focal lesion. Sinuses/Orbits: No acute finding. Other: None. CT CERVICAL SPINE FINDINGS Alignment: Normal. Skull base and vertebrae: No acute fracture. No primary bone lesion or focal pathologic process. Soft tissues and spinal canal: No  prevertebral fluid or swelling. No visible canal hematoma. Disc levels: Status post surgical anterior fusion of C5-6. Moderate degenerative disc disease is noted at C4-5 and C6-7. Upper chest: Negative. Other: None. IMPRESSION: 1. Mild diffuse cortical atrophy. Mild chronic ischemic white matter disease. No acute intracranial abnormality seen. 2. Postsurgical and degenerative changes as described above. No acute abnormality seen in the cervical spine. Electronically Signed   By: Lupita Raider M.D.   On: 11/02/2020 08:49   CT Cervical Spine Wo Contrast  Result Date: 11/02/2020 CLINICAL DATA:  Fall. EXAM: CT HEAD WITHOUT CONTRAST CT CERVICAL SPINE WITHOUT CONTRAST TECHNIQUE: Multidetector CT imaging of the head and cervical spine was performed following the standard protocol without intravenous contrast. Multiplanar CT image reconstructions of the cervical spine were also generated. COMPARISON:  October 04, 2017.  October 09, 2016. FINDINGS: CT HEAD FINDINGS Brain: Mild diffuse cortical atrophy is noted. Mild chronic ischemic white matter disease is noted. No mass effect or midline shift is noted. Ventricular size is within normal limits. There is no evidence of mass lesion, hemorrhage or acute infarction. Vascular: No hyperdense vessel or unexpected calcification. Skull: Normal. Negative for fracture or focal lesion. Sinuses/Orbits: No acute finding. Other: None. CT CERVICAL SPINE FINDINGS Alignment: Normal. Skull base and vertebrae: No acute fracture. No primary bone lesion or focal pathologic process. Soft tissues and spinal canal: No prevertebral fluid or swelling. No visible canal hematoma. Disc levels: Status post surgical anterior fusion of C5-6. Moderate degenerative disc disease is noted at C4-5 and C6-7. Upper chest: Negative. Other: None. IMPRESSION: 1. Mild diffuse cortical atrophy. Mild chronic ischemic white matter disease. No acute intracranial abnormality seen. 2. Postsurgical and degenerative  changes as described above. No acute abnormality seen in the cervical spine. Electronically Signed   By: Lupita Raider M.D.   On: 11/02/2020 08:49   MR BRAIN WO CONTRAST  Result Date: 11/05/2020 CLINICAL DATA:  Altered mental status, fall EXAM: MRI HEAD WITHOUT CONTRAST TECHNIQUE: Multiplanar, multiecho pulse sequences of the brain and surrounding structures were obtained without intravenous contrast. COMPARISON:  January 2018 FINDINGS: Brain: There is no acute infarction or intracranial hemorrhage. There is no intracranial mass, mass effect, or edema. There is no hydrocephalus or extra-axial fluid collection. Patchy foci of T2 hyperintensity in the supratentorial white matter are nonspecific but probably reflect mild to moderate chronic microvascular ischemic changes. 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 Small chronic infarcts of the centrum semiovale bilaterally. Punctate foci of susceptibility within the left cerebellum and right frontal subcortical white  matter likely reflect chronic microhemorrhage. Ventricles and sulci are prominent reflecting generalized parenchymal volume loss. Vascular: Major vessel flow voids at the skull base are preserved. Skull and upper cervical spine: Normal marrow signal is preserved. Sinuses/Orbits: Minor mucosal thickening. Bilateral lens replacements. Other: Sella is unremarkable.  Mastoid air cells are clear. IMPRESSION: No evidence of recent infarction, hemorrhage, or mass. Chronic microvascular ischemic changes and small chronic infarcts similar to prior study. Electronically Signed   By: Guadlupe SpanishPraneil  Patel M.D.   On: 11/05/2020 11:18   DG Chest Port 1 View  Result Date: 11/04/2020 CLINICAL DATA:  Fall 1 week ago with fractured shoulder EXAM: PORTABLE CHEST 1 VIEW COMPARISON:  Radiograph 10/04/2017, radiographs 11/02/2020 FINDINGS: Previously seen fracture left humeral neck is not well visualized on this radiograph given collimation of much of the left upper  extremity. No other acute osseous abnormality is seen. Prior cervical fusion is incompletely assessed. Telemetry leads overlie the chest. Chronic hyperinflation and coarsened interstitial changes with bronchitic features. No consolidative process. No pneumothorax or effusion. No convincing features of edema. Large hiatal hernia unchanged from prior. Calcified tortuous aorta. Remaining cardiomediastinal contours are unremarkable. IMPRESSION: 1. Previously seen fracture left humeral neck is not well visualized on this radiograph given collimation of much of the left upper extremity. 2. No acute cardiopulmonary abnormality. 3. Large hiatal hernia. 4.  Aortic Atherosclerosis (ICD10-I70.0). Electronically Signed   By: Kreg ShropshirePrice  DeHay M.D.   On: 11/04/2020 21:26   DG Shoulder Left  Result Date: 11/02/2020 CLINICAL DATA:  Status post fall with left shoulder pain. EXAM: LEFT SHOULDER - 2+ VIEW COMPARISON:  None. FINDINGS: There is a severely displaced fracture of the neck of the left humerus with superior displacement of the distal fracture fragment. The acromioclavicular joint is not well visualized due to overlying bony structures, and therefore additional fractures cannot be excluded. There is soft tissue swelling. IMPRESSION: 1. Severely displaced fracture of the left humeral neck with superior displacement of the distal fracture fragment. 2. Limited evaluation of the acromioclavicular joint due to overlying bony structures. Electronically Signed   By: Ted Mcalpineobrinka  Dimitrova M.D.   On: 11/02/2020 09:19   DG Hand Complete Left  Result Date: 11/02/2020 CLINICAL DATA:  Fall.  Left hand swelling. EXAM: LEFT HAND - COMPLETE 3+ VIEW COMPARISON:  None. FINDINGS: Extensive soft tissue swelling is present over the dorsal aspect of the proximal phalanges. No underlying fracture is present. Degenerative changes are present throughout the wrist. Radial subluxation is present at the first [facility]. Ulnar deviation is present at the  MCP joints. Osteoarthritic degenerative changes are present in the interphalangeal joints. IMPRESSION: 1. Extensive soft tissue swelling over the dorsal aspect of the proximal phalanges without underlying fracture. 2. Degenerative changes throughout the wrist and hand. Electronically Signed   By: Marin Robertshristopher  Mattern M.D.   On: 11/02/2020 10:04   EEG adult  Result Date: 11/06/2020 Charlsie QuestYadav, Priyanka O, MD     11/06/2020  2:13 PM Patient Name: Corliss Marcusrma J Meckes MRN: [id number] Epilepsy Attending: Charlsie QuestPriyanka O Yadav Referring Physician/Provider: Dr Osvaldo ShipperGokul Krishnan Date: 11/06/2020 Duration: 23.25 mins Patient history: 80yo F with ams. EEG to evaluate for seizure Level of alertness: Awake AEDs during EEG study: None Technical aspects: This EEG study was done with scalp electrodes positioned according to the 10-20 International system of electrode placement. Electrical activity was acquired at a sampling rate of 500Hz  and reviewed with a high frequency filter of 70Hz  and a low frequency filter of 1Hz . EEG data were recorded  continuously and digitally stored. Description: The posterior dominant rhythm consists of 7 Hz activity of moderate voltage (25-35 uV) seen predominantly in posterior head regions, symmetric and reactive to eye opening and eye closing. Hyperventilation and photic stimulation were not performed.   ABNORMALITY - Background slow IMPRESSION: This study is suggestive of mild diffuse encephalopathy, nonspecific etiology. No seizures or epileptiform discharges were seen throughout the recording. Priyanka Annabelle Harman   DG Hip Unilat W or Wo Pelvis 2-3 Views Left  Result Date: 11/02/2020 CLINICAL DATA:  Left hip pain after fall. EXAM: DG HIP (WITH OR WITHOUT PELVIS) 2-3V LEFT COMPARISON:  October 09, 2016 FINDINGS: There is no evidence of hip fracture or dislocation. Mild-to-moderate osteoarthritic changes in bilateral hips. Foley catheter in place. IMPRESSION: No acute fracture or dislocation identified about the left  hip. Electronically Signed   By: Ted Mcalpine M.D.   On: 11/02/2020 13:53        The results of significant diagnostics from this hospitalization (including imaging, microbiology, ancillary and laboratory) are listed below for reference.     Microbiology: Recent Results (from the past 240 hour(s))  SARS Coronavirus 2 by RT PCR (hospital order, performed in [facility] hospital lab) Nasopharyngeal Nasopharyngeal Swab     Status: None   Collection Time: 11/02/20  1:58 PM   Specimen: Nasopharyngeal Swab  Result Value Ref Range Status   SARS Coronavirus 2 NEGATIVE NEGATIVE Final    Comment: (NOTE) SARS-CoV-2 target nucleic acids are NOT DETECTED.  The SARS-CoV-2 RNA is generally detectable in upper and lower respiratory specimens during the acute phase of infection. The lowest concentration of SARS-CoV-2 viral copies this assay can detect is 250 copies / mL. A negative result does not preclude SARS-CoV-2 infection and should not be used as the sole basis for treatment or other patient management decisions.  A negative result may occur with improper specimen collection / handling, submission of specimen other than nasopharyngeal swab, presence of viral mutation(s) within the areas targeted by this assay, and inadequate number of viral copies (<250 copies / mL). A negative result must be combined with clinical observations, patient history, and epidemiological information.  Fact Sheet for Patients:   BoilerBrush.com.cy  Fact Sheet for Healthcare Providers: https://pope.com/  This test is not yet approved or  cleared by the [place] FDA and has been authorized for detection and/or diagnosis of SARS-CoV-2 by FDA under an Emergency Use Authorization (EUA).  This EUA will remain in effect (meaning this test can be used) for the duration of the COVID-19 declaration under Section 564(b)(1) of the Act, 21 U.S.C. section  360bbb-3(b)(1), unless the authorization is terminated or revoked sooner.  Performed at [facility], [address] [place], [place] [address]   Urine culture     Status: Abnormal   Collection Time: 11/04/20  9:10 PM   Specimen: In/Out Cath Urine  Result Value Ref Range Status   Specimen Description   Final    IN/OUT CATH URINE Performed at [facility], [address] [place], [place] [address]    Special Requests   Final    NONE Performed at [facility], [address] [place], [place] [address]    Culture (A)  Final    <10,000 COLONIES/mL INSIGNIFICANT GROWTH Performed at [facility], [address], [place], [place] [address]    Report Status 11/06/2020 FINAL  Final  Resp Panel by RT-PCR (Flu A&B, Covid) Nasopharyngeal Swab  Status: None   Collection Time: 11/04/20  9:15 PM   Specimen: Nasopharyngeal Swab; Nasopharyngeal(NP) swabs in vial transport medium  Result Value Ref Range Status   SARS Coronavirus 2 by RT PCR NEGATIVE NEGATIVE Final    Comment: (NOTE) SARS-CoV-2 target nucleic acids are NOT DETECTED.  The SARS-CoV-2 RNA is generally detectable in upper respiratory specimens during the acute phase of infection. The lowest concentration of SARS-CoV-2 viral copies this assay can detect is 138 copies/mL. A negative result does not preclude SARS-Cov-2 infection and should not be used as the sole basis for treatment or other patient management decisions. A negative result may occur with  improper specimen collection/handling, submission of specimen other than nasopharyngeal swab, presence of viral mutation(s) within the areas targeted by this assay, and inadequate number of viral copies(<138 copies/mL). A negative result must be combined with clinical observations, patient history, and epidemiological information. The expected result is Negative.  Fact Sheet for Patients:   BloggerCourse.com  Fact Sheet for Healthcare Providers:  SeriousBroker.it  This test is no t yet approved or cleared by the [place] FDA and  has been authorized for detection and/or diagnosis of SARS-CoV-2 by FDA under an Emergency Use Authorization (EUA). This EUA will remain  in effect (meaning this test can be used) for the duration of the COVID-19 declaration under Section 564(b)(1) of the Act, 21 U.S.C.section 360bbb-3(b)(1), unless the authorization is terminated  or revoked sooner.       Influenza A by PCR NEGATIVE NEGATIVE Final   Influenza B by PCR NEGATIVE NEGATIVE Final    Comment: (NOTE) The Xpert Xpress SARS-CoV-2/FLU/RSV plus assay is intended as an aid in the diagnosis of influenza from Nasopharyngeal swab specimens and should not be used as a sole basis for treatment. Nasal washings and aspirates are unacceptable for Xpert Xpress SARS-CoV-2/FLU/RSV testing.  Fact Sheet for Patients: BloggerCourse.com  Fact Sheet for Healthcare Providers: SeriousBroker.it  This test is not yet approved or cleared by the [place] FDA and has been authorized for detection and/or diagnosis of SARS-CoV-2 by FDA under an Emergency Use Authorization (EUA). This EUA will remain in effect (meaning this test can be used) for the duration of the COVID-19 declaration under Section 564(b)(1) of the Act, 21 U.S.C. section 360bbb-3(b)(1), unless the authorization is terminated or revoked.  Performed at [facility], [address], [place], [place] [address]   Blood culture (routine single)     Status: None   Collection Time: 11/04/20 10:00 PM   Specimen: BLOOD  Result Value Ref Range Status   Specimen Description   Final    BLOOD RIGHT ANTECUBITAL Performed at [facility], [address] [place], [place] [address]    Special Requests    Final    BOTTLES DRAWN AEROBIC AND ANAEROBIC Blood Culture adequate volume Performed at [facility], [address] [place], [place] [address]    Culture   Final    NO GROWTH 5 DAYS Performed at [facility], [address] [place], [place] [address]    Report Status 11/10/2020 FINAL  Final     Labs:  CBC: Recent Labs  Lab 11/07/20 0605 11/08/20 0635 11/09/20 0559 11/10/20 0649 11/12/20 0554  WBC 5.7 5.8 6.8 6.2 10.6*  HGB 8.8* 8.9* 8.9* 9.2* 9.0*  HCT 27.0* 28.0* 27.2* 27.0* 28.1*  MCV 103.8* 103.7* 100.7* 99.3 104.9*  PLT 144* 153 186 166 226   BMP &GFR Recent Labs  Lab  11/07/20 3149 11/08/20 0635 11/09/20 0559 11/10/20 0649 11/12/20 0554  NA 137 138 135 138 142  K 3.8 3.7 3.5 3.7 3.6  CL 105 106 104 106 108  CO2 22 23 22  21* 23  GLUCOSE 113* 113* 114* 108* 135*  BUN 17 14 15 15  29*  CREATININE 0.81 0.81 0.88 0.84 0.73  CALCIUM 8.6* 8.6* 8.8* 8.8* 9.4  MG  --   --   --  1.8 2.2  PHOS  --   --   --  2.8 2.9   Estimated Creatinine Clearance: 42.1 mL/min (by C-G formula based on SCr of 0.73 mg/dL). Liver & Pancreas: Recent Labs  Lab 11/06/20 0608 11/07/20 0605 11/08/20 0635 11/09/20 0559 11/10/20 0649  AST 27 24 23 21 28   ALT 11 10 5 5 5   ALKPHOS 35* 37* 39 42 39  BILITOT 0.7 1.0 0.7 0.8 1.1  PROT 5.7* 6.0* 5.8* 6.1* 6.0*  ALBUMIN 3.2* 3.4* 3.4* 3.5 3.5   No results for input(s): LIPASE, AMYLASE in the last 168 hours. No results for input(s): AMMONIA in the last 168 hours. Diabetic: No results for input(s): HGBA1C in the last 72 hours. Recent Labs  Lab 11/11/20 1724 11/11/20 1945 11/12/20 0203 11/12/20 0532 11/12/20 0730  GLUCAP 87 175* 157* 116* 142*   Cardiac Enzymes: No results for input(s): CKTOTAL, CKMB, CKMBINDEX, TROPONINI in the last 168 hours. No results for input(s): PROBNP in the last 8760 hours. Coagulation Profile: No results for input(s): INR, PROTIME in the last 168 hours. Thyroid Function  Tests: No results for input(s): TSH, T4TOTAL, FREET4, T3FREE, THYROIDAB in the last 72 hours. Lipid Profile: No results for input(s): CHOL, HDL, LDLCALC, TRIG, CHOLHDL, LDLDIRECT in the last 72 hours. Anemia Panel: No results for input(s): VITAMINB12, FOLATE, FERRITIN, TIBC, IRON, RETICCTPCT in the last 72 hours. Urine analysis:    Component Value Date/Time   COLORURINE YELLOW 11/04/2020 2110   APPEARANCEUR CLEAR 11/04/2020 2110   APPEARANCEUR Clear 01/05/2016 1049   LABSPEC 1.013 11/04/2020 2110   LABSPEC 1.008 08/27/2013 1740   PHURINE 7.0 11/04/2020 2110   GLUCOSEU NEGATIVE 11/04/2020 2110   GLUCOSEU Negative 08/27/2013 1740   HGBUR NEGATIVE 11/04/2020 2110   BILIRUBINUR NEGATIVE 11/04/2020 2110   BILIRUBINUR Negative 01/05/2016 1049   BILIRUBINUR Negative 08/27/2013 1740   KETONESUR 5 (A) 11/04/2020 2110   PROTEINUR NEGATIVE 11/04/2020 2110   NITRITE NEGATIVE 11/04/2020 2110   LEUKOCYTESUR NEGATIVE 11/04/2020 2110   LEUKOCYTESUR Trace 08/27/2013 1740   Sepsis Labs: Invalid input(s): PROCALCITONIN, LACTICIDVEN   Time coordinating discharge: 35 minutes  SIGNED:  01/02/2021, MD  [facility] 11/12/2020, 11:01 AM  If 7PM-7AM, please contact night-coverage [email or web address]

## 2020-11-12 NOTE — TOC Transition Note (Signed)
Transition of Care [facility]) - CM/SW Discharge Note   Patient Details  Name: Carrie Glenn MRN: [id number] Date of Birth: 1941/06/24  [facility] of [facility]) CM/SW Contact:  Bartholome Bill, RN Phone Number: 11/12/2020, 2:08 PM   Clinical Narrative:    Pt to dc home via PTAR today. Confirmed with daughter that pt going to [address] [place] [address]. Yellow DNR on chart for MD signature.   Final next level of care: Home w Home Health Services Barriers to Discharge: No Barriers Identified   Patient Goals and CMS Choice   CMS Medicare.gov Compare Post Acute Care list provided to::  (daughter wants to continue with [facility])    Discharge Placement                Patient to be transferred to facility by: PTAR Name of family member notified: Daughter Patient and family notified of of transfer: 11/12/20  Discharge Plan and Services In-house Referral: Clinical Social Work   Post Acute Care Choice: Home Health              Readmission Risk Interventions No flowsheet data found.

## 2020-11-12 NOTE — Progress Notes (Signed)
Order received for swallow evaluation to be repeated due to pt coughing with all intake per RN.  Oral holding of ? Secretions and/or liquids noted by OT during prior session.   Given concern for aspiration, neuro hx, and prior clinical eval completed, will proceed with MBS *with minimal barium* to allow instrumental swallow evaluation.  Plan is for noon per Xray. Full report to follow. Rolena Infante, MS [facility] SLP Acute Rehab Services Office [phone] Pager [phone]

## 2020-11-12 NOTE — Discharge Instructions (Signed)
Confusion Confusion is the inability to think with your usual speed or clarity. Confusion can be caused by many things. People who are confused often describe their thinking as cloudy or unclear. Confusion can also include feeling disoriented. This means you are unaware of where you are or who you are. You may also not know the date or time. When confused, you may have trouble remembering, paying attention, or making decisions. Some people also act aggressively when they are confused. In some cases, confusion may come on quickly. In other cases, it may develop slowly over time. Confusion may be caused by medical conditions such as:  Infections, such as a urinary tract infection (UTI).  Low levels of oxygen, which can develop from conditions such as long-term lung disorders.  Decrease in brain function due to dementia and other conditions that affect the brain, such as seizures, strokes, brain tumors, or head injuries.  Mental health conditions, like panic attacks, anxiety, depression, and hallucinations. Confusion may also be caused by physical factors such as:  Loss of fluid (dehydration) or an imbalance of salts and minerals in the body (electrolytes).  Lack of certain nutrients like niacin, thiamine, or other B vitamins.  Fever or hypothermia, which is a sudden drop in body temperature.  Low or high blood sugar.  Low or high blood pressure. Other causes include:  Lack of sleep or changes in routine or surroundings, such as when traveling or staying in a hospital.  Using too much alcohol, drugs, or medicine.  Side effects of medicines, or taking medicines that affect other medicines (drug interactions). Follow these instructions at home: Pay attention to your symptoms. Tell your health care provider about any changes or if you develop new symptoms. Follow these instructions to control or treat symptoms. Ask a family member or friend for help if needed. Medicines  Take  over-the-counter and prescription medicines only as told by your health care provider.  Ask your health care provider about changing or stopping any medicines that may be causing your confusion.  Avoid pain medicines or sleep medicines until you have fully recovered.  Use a pillbox or an alarm to help you take the right medicines at the right time.   Lifestyle  Eat a balanced diet that includes fruits and vegetables.  Get enough sleep. For most adults, this is 7-9 hours each night.  Do not drink alcohol.  Do not become isolated. Spend time with other people and make plans for your days.  Do not drive until your health care provider says that it is safe to do so.  Do not use any products that contain nicotine or tobacco, such as cigarettes, e-cigarettes, and chewing tobacco. If you need help quitting, ask your health care provider.  Stop other activities that may increase your chances of getting hurt. These may include some work duties, sports activities, swimming, or bike riding. Ask your health care provider what activities are safe for you.   Tips for caregivers  Find out if the person is confused. Ask the person to state his or her name, age, and the date. If the person is unsure or answers incorrectly, he or she may be confused and need assistance.  Always introduce yourself, no matter how well the person knows you. Remind the person of his or her location.  Place a calendar and clock near the person who is confused. Keep a regular schedule. Make sure the person has plenty of light during the day and sleep at night.    Talk about current events and plans for the day.  Keep the environment calm, quiet, and peaceful.  Help the person do the things that he or she is unable to do. These include: ? Taking medicines. ? Keeping medical appointments. ? Helping with household duties, including meal preparation. ? Running errands.  Get help if you need it. There are several support  groups for caregivers. If the person you are helping needs more support, consider day care, extended-care programs, or a skilled nursing facility. The person's health care provider may be able to help evaluate these options. General instructions  Monitor yourself for any conditions you may have. These can include: ? Checking your blood glucose levels if you have diabetes. ? Maintaining a healthy weight. ? Monitoring your blood pressure if you have hypertension. ? Monitoring your body temperature if you have a fever.  Keep all follow-up visits. This is important. Contact a health care provider if:  You have new symptoms or your symptoms get worse. Get help right away if you:  Feel that you are not able to care for yourself.  Develop severe headaches, repeated vomiting, seizures, blackouts, or slurred speech.  Have increasing confusion, weakness, numbness, restlessness, or personality changes.  Develop a loss of balance, have marked dizziness, feel uncoordinated, or fall.  Develop severe anxiety, or you have delusions or hallucinations. These symptoms may represent a serious problem that is an emergency. Do not wait to see if the symptoms will go away. Get medical help right away. Call your local emergency services (911 in the [place] Do not drive yourself to the hospital. Summary  Confusion is the inability to think with your usual speed or clarity. People who are confused often describe their thinking as cloudy or unclear.  Confusion can also include having trouble remembering, paying attention, or making decisions.  Confusion may come on quickly or develop slowly over time, depending on the cause. There are many different causes of confusion.  Ask for help from family members or friends if you are unable to take care of yourself. This information is not intended to replace advice given to you by your health care provider. Make sure you discuss any questions you have with your health  care provider. Document Revised: 01/08/2020 Document Reviewed: 01/08/2020 Elsevier Patient Education  2021 Elsevier Inc.  

## 2020-11-12 NOTE — Progress Notes (Addendum)
MBS completed, Full report to follow.  Limited barium provided due to pt not having a bowel movement in several days.  Oropharyngeal dysphagia noted with decreased oral transiting noted, oral coordination, pharyngeal swallow was weak resulting in retention  - oropharyngeal -vallecular more than pyriform-  without consistent sensation.  Trace aspiration noted *mixed with secretions* due to premature spillage of bolus into airway prior to swallow initiation due to poor oral control.   Trace aspiration resulted in reflexive cough response.  Pt had difficulty maintaining head neutral position despite max verbal cues, thus did not attempt other manuevers.     SLP only provided pt with single bolus of pudding, a few boluses of nectar and thin.  At this time, recommend pt consume creamy purees and thin WATER with meals.  If pt is coughing with thin = recommend use nectar thick liquids with meals.  THIN WATER advised between meals if pt uses thickened drinks for meals - to assure hydration as best able.  Conducting dry swallows and strengthening cough/"hock" - expectoration important for maximum airway protection.  Recommend follow up SLP via HH to help manage pt's dysphagia.  Note plans for pt to dc today per notes.  Swallow precaution sign sent with pt.  Informed RN of recommendation.   Rolena Infante, MS [facility] SLP Acute Rehab Services Office [phone] Pager [phone]

## 2020-11-12 NOTE — Progress Notes (Signed)
Occupational Therapy Treatment Patient Details Name: Carrie Glenn MRN: [id number] DOB: [birth year] Today's Date: 11/12/2020    History of present illness 80 y.o. female with history of dementia, chronic kidney disease stage III, diabetes mellitus, Parkinson's, hypertension was recently brought into the ER 2 days PTA after patient had a fall and fractured her left shoulder.  Patient was eventually discharged back to skilled nursing facility patient was found to be acutely confused.  Was brought back to the ER. Dx of acute encephalopathy, CT of head and MRI of brain negative for acute changes.   OT comments  Upon arrival patient with gargling speech, note significant secretions in patient's mouth. Patient require max A with wash cloth and oral swab to clear mouth. Pt able to wash face with R UE with min tactile cues for thoroughness. Note that patient's sling with poor position therefore repositioned and attempted to have patient participate in ROM of L digits however patient guards/facial grimace keeping L digits flexed. Instructed patient in using R UE to assist with pulling trunk from head of bed, requires max A to complete and reposition as patient leaning to L side upon arrival. Pt continues to have pain, ongoing confusion "how did I get here?" will continue to trial acute OT services to reduce caregiver burden and facilitate D/C to venue listed below.   Follow Up Recommendations  SNF;Supervision/Assistance - 24 hour    Equipment Recommendations  None recommended by OT       Precautions / Restrictions Precautions Precautions: Fall Precaution Comments: legally blind Required Braces or Orthoses: Other Brace Other Brace: L UE sling Restrictions Weight Bearing Restrictions: Yes LUE Weight Bearing: Non weight bearing       Mobility Bed Mobility Overal bed mobility: Needs Assistance             General bed mobility comments: instruct patient in using R UE to pull up on bed rail to  oull trunk forward from head of bed, pt needed max A to complete and to resposition as patient leaning to L upon arrival         ADL either performed or assessed with clinical judgement   ADL Overall ADL's : Needs assistance/impaired     Grooming: Oral care;Maximal assistance;Bed level;Wash/dry face Grooming Details (indicate cue type and reason): upon arrival patient speech gurgling, needing max A with use of wash cloth and oral swab to clear significant secretions. also instruct and assist patient with repositioning head as she tends to keep in extended position. positioned more to neutral making it easier for patient to spit up secretions. RN is aware.  Provided patient wash cloth to wash face with R hand, pt able to do so thoroughly with min tactile cues.                                     Vision   Additional Comments: legally blind          Cognition Arousal/Alertness: Awake/alert Behavior During Therapy: Flat affect Overall Cognitive Status: No family/caregiver present to determine baseline cognitive functioning                                 General Comments: patient stating she is confused "How did I get here?" and asks 3-4 times if she is in a hospital bed  Exercises Exercises: Other exercises Other Exercises Other Exercises: attempted to have patient extend L digits however pt with significant grimacing and guarding flexing against OT attempt to lightly extend L digits.           Pertinent Vitals/ Pain       Pain Assessment: Faces Faces Pain Scale: Hurts even more Pain Location: L UE with positioning of sling Pain Descriptors / Indicators: Grimacing;Guarding Pain Intervention(s): Monitored during session         Frequency  Min 1X/week        Progress Toward Goals  OT Goals(current goals can now be found in the care plan section)  Progress towards OT goals: Not progressing toward goals - comment (limited by pain,  cognitive deficits)  Acute Rehab OT Goals Patient Stated Goal: "I want to eat breakfast" OT Goal Formulation: With patient Time For Goal Achievement: 11/17/20 Potential to Achieve Goals: Fair ADL Goals Pt Will Perform Grooming: with caregiver independent in assisting;with min assist;sitting;bed level Pt Will Perform Upper Body Dressing: with caregiver independent in assisting;with min assist;sitting Pt Will Transfer to Toilet: bedside commode;stand pivot transfer;with +2 assist;with mod assist  Plan Discharge plan remains appropriate       AM-PAC OT "6 Clicks" Daily Activity     Outcome Measure   Help from another person eating meals?: Total Help from another person taking care of personal grooming?: A Lot Help from another person toileting, which includes using toliet, bedpan, or urinal?: Total Help from another person bathing (including washing, rinsing, drying)?: Total Help from another person to put on and taking off regular upper body clothing?: Total Help from another person to put on and taking off regular lower body clothing?: Total 6 Click Score: 7    End of Session    OT Visit Diagnosis: Unsteadiness on feet (R26.81);Muscle weakness (generalized) (M62.81);Repeated falls (R29.6);Pain Pain - Right/Left: Left Pain - part of body: Shoulder   Activity Tolerance Patient limited by pain   Patient Left in bed;with bed alarm set;with call bell/phone within reach   Nurse Communication Other (comment) (RN + CNA aware of significant secretions in patient's mouth)        Time: 7672-0947 OT Time Calculation (min): 26 min  Charges: OT General Charges $OT Visit: 1 Visit OT Treatments $Self Care/Home Management : 8-22 mins $Therapeutic Activity: 8-22 mins  Marlyce Huge OT OT pager: [phone]   Carmelia Roller 11/12/2020, 10:37 AM

## 2020-11-14 ENCOUNTER — Telehealth: Payer: Self-pay | Admitting: Nurse Practitioner

## 2020-11-14 NOTE — Telephone Encounter (Signed)
Spoke with patient's daughter, [name], regarding the Palliative referral and our services and all questions were answered and she was in agreement with scheduling visit.  I have scheduled an In-home Consult for 11/27/20 @ 8:30 AM

## 2020-11-27 ENCOUNTER — Other Ambulatory Visit: Payer: Medicare Other | Admitting: Nurse Practitioner

## 2020-11-27 ENCOUNTER — Other Ambulatory Visit: Payer: Self-pay

## 2020-11-27 ENCOUNTER — Encounter: Payer: Self-pay | Admitting: Nurse Practitioner

## 2020-11-27 DIAGNOSIS — F0280 Dementia in other diseases classified elsewhere without behavioral disturbance: Secondary | ICD-10-CM

## 2020-11-27 DIAGNOSIS — G20 Parkinson's disease: Secondary | ICD-10-CM

## 2020-11-27 DIAGNOSIS — Z515 Encounter for palliative care: Secondary | ICD-10-CM

## 2020-11-27 NOTE — Progress Notes (Signed)
Therapist, nutritional Palliative Care Consult Note Telephone: [phone]  Fax: [phone]  PATIENT NAME: Carrie Glenn DOB: 07-19-1941 MRN: [id number]  PRIMARY CARE PROVIDER:  Dr Dion Saucier PROVIDER: Dr Marcello Fennel RESPONSIBLE PARTY:   Germain Osgood (daugh) [phone]  I was asked to see Carrie Glenn by Dr Marcello Fennel for Palliative care consult for complex medical decision making  1. Advance Care Planning; DNR; placed in Vynca 2. Goals of Care: Goals include to maximize quality of life and symptom management. Our advance care planning conversation included a discussion about:     The value and importance of advance care planning   Exploration of personal, cultural or spiritual beliefs that might influence medical decisions   Exploration of goals of care in the event of a sudden injury or illness   Identification and preparation of a healthcare agent   Review and updating or creation of an advance directive document.  3. Palliative care encounter; Palliative care encounter; Palliative medicine team will continue to support patient, patient's family, and medical team. Visit consisted of counseling and education dealing with the complex and emotionally intense issues of symptom management and palliative care in the setting of serious and potentially life-threatening illness  4. f/u 1 month for ongoing monitoring chronic disease progression, ongoing discussions complex medical decision making  I spent 90 minutes providing this consultation,  from 8:45am to 10:15am. More than 50% of the time in this consultation was spent coordinating communication.   HISTORY OF PRESENT ILLNESS:  Carrie Glenn is a 80 y.o. year old female with multiple medical problems including Dementia with Parkinson's disease, diabetes, chronic kidney disease, hypertension,  dysphasia, arthritis, legally blind, chronic back pain, degenerative disc disease, lumbar, gerd, hard of hearing, osteoporosis.  Hospitalized 2 / 6 / 2022 to 2 / 8 / 2022 for a fall with left humerus fracture. Hospitalized 2/8 / 2022 to 2 / 16 / 2022 for acute encephalopathy likely from polypharmacy, delirium with extensive workup which were unrevealing. She was discharged to her daughter's home. I called Carrie Glenn daughter Carrie Glenn to confirm Palliative care initial visit and covid screening which was negative. Prior to visiting with Carrie Glenn I spent time with Carrie Glenn talking about the last time Carrie Glenn was independent. Carrie Glenn endorses she has been living with Carrie Glenn about 5 years. We talked about past medical history, chronic disease progression. We talked about Carrie Glenn functional level prior to hospitalization in February for what she was ambulatory with a [place]. We talked about recent hospitalization, fall. Currently Carrie Glenn is not able to ambulate she is able to stand with assistance and gait belt with moderate to maximum assistance. Carrie Glenn is total ADL dependent with episodes of incontinence. We talked about the wound on Carrie Glenn's back, upper buttocks and left posterior arm. We talked about Carrie Glenn fractured left shoulder which is displaced and pending Orthopedic visit with surgery. Carrie Glenn talked about hospitalization when discharged to short-term rehab. Carrie Glenn endorses she was not even there a full day and she had to return to the Glenn and of concern for care. Carrie Glenn endorses after hospitalization she was discharged home. Carrie Glenn endorses she is her primary care provider. We talked about daily routine. We talked about physical therapy and occupational which is on hold until surgery is completed for her left shoulder. Speech is working with Carrie Glenn which has improved and she returned to a regular diet. We talked about Carrie Glenn appetite. We talked about wounds and  wound care. We talked about home health who is continuing to come in for nursing. We talked about pain with current pain regiment. We talked about caregiver  stress and fatigue. We talked about coping strategies. I visited with Carrie Glenn with Carrie Glenn. Carrie Glenn was cooperative with assessment. Carrie Glenn endorses that she has been doing okay. Viewed wounds. We talked about symptoms of pain, appetite. We talked about recent hospitalization though Carrie Glenn was limited with mild cognitive impairment. We talked about medical goals of care. Carrie Glenn is a DNR with [place] form above her bed. Copied and will place in vynca. We talked about role of Palliative care and plan of care as a Consulting role. We talked about will follow up in 4 weeks if needed or sooner should she declined. Carrie Glenn endorses she will contact Palliative care after Orthopedic visit to see when date for surgery will be scheduled. Therapeutic listening and emotional support provided. Contact information. Questions answered to satisfaction  ROS reviewed negative except addressed in [facility]  Palliative Care was asked to help address goals of care.   CODE STATUS: DNR  PPS: 40% HOSPICE ELIGIBILITY/DIAGNOSIS: TBD  PAST MEDICAL HISTORY:  Past Medical History:  Diagnosis Date  . Acute encephalopathy 04/09/2015  . Acute renal failure (HCC) 03/09/2015  . Acute UTI 03/08/2015  . Arthritis   . Awareness of heartbeats 07/10/2014  . Blind    Legally  . Chronic back pain 03/08/2015  . Controlled type 2 diabetes mellitus without complication (HCC) 06/10/2015  . Degeneration of intervertebral disc of lumbar region 09/04/2015  . Degenerative arthritis of lumbar spine 08/15/2014  . Diabetes (HCC) 04/07/2015  . Diabetes mellitus without complication (HCC)   . Difficult or painful urination 05/23/2014  . Excessive urination at night 01/31/2014  . Frequent falls 04/09/2015  . Gastro-esophageal reflux disease without esophagitis 09/19/2015  . Hard of hearing   . HCAP (healthcare-associated pneumonia) 04/09/2015  . HTN (hypertension) 04/07/2015  . Hypertension   . Hypotension 03/08/2015  . Osteoporosis   .  Pain    Chronic back  . Retinitis pigmentosa of both eyes   . Type 2 diabetes mellitus (HCC) 11/28/2014    SOCIAL HX:  Social History   Tobacco Use  . Smoking status: Never Smoker  . Smokeless tobacco: Never Used  Substance Use Topics  . Alcohol use: No    ALLERGIES:  Allergies  Allergen Reactions  . Amlodipine Swelling  . Erythromycin Nausea Only  . Lexapro [Escitalopram Oxalate] Nausea Only  . Lipitor [Atorvastatin] Nausea And Vomiting  . Macrodantin [Nitrofurantoin Macrocrystal] Nausea Only  . Nitrofurantoin Nausea Only  . Sertraline Hcl Other (See Comments)    Patients gets jittery.  . Trazodone And Nefazodone Other (See Comments)    Patient gets insomnia.  . Amoxicillin Diarrhea, Nausea And Vomiting, Nausea Only and Rash  . Azithromycin Rash  . Effexor [Venlafaxine] Rash  . Metformin And Related Itching and Rash  . Penicillins Rash     PERTINENT MEDICATIONS:  Outpatient Encounter Medications as of 11/27/2020  Medication Sig  . acetaminophen (TYLENOL) 500 MG tablet Take 1 tablet (500 mg total) by mouth every 8 (eight) hours.  Marland Kitchen aspirin EC 81 MG tablet Take 81 mg by mouth daily after supper.   . Calcium Carbonate-Vitamin D3 600-400 MG-UNIT TABS Take 1 tablet by mouth daily.  . Carbidopa-Levodopa ER (SINEMET CR) 25-100 MG tablet controlled release Take 1 tablet by mouth 2 (two) times daily.  . Cholecalciferol (VITAMIN D) 50 MCG (2000  UT) CAPS Take 2,000 Units by mouth daily.  . cyclobenzaprine (FLEXERIL) 5 MG tablet Take 5 mg by mouth at bedtime as needed for muscle spasms.  Marland Kitchen docusate sodium (COLACE) 250 MG capsule Take 250 mg by mouth 2 (two) times daily.  . felodipine (PLENDIL) 5 MG 24 hr tablet Take 5 mg by mouth daily.  . fluticasone (FLONASE) 50 MCG/ACT nasal spray Place 1 spray into both nostrils daily as needed for allergies or rhinitis.  Marland Kitchen gabapentin (NEURONTIN) 100 MG capsule Take 100 mg by mouth at bedtime.  . iron polysaccharides (NIFEREX) 150 MG capsule Take  150 mg by mouth daily.  . memantine (NAMENDA) 10 MG tablet Take 10 mg by mouth 2 (two) times daily.  . mirtazapine (REMERON) 7.5 MG tablet Take 7.5 mg by mouth at bedtime.  . Multiple Vitamin (MULTIVITAMIN WITH MINERALS) TABS tablet Take 1 tablet by mouth daily.  Marland Kitchen omeprazole (PRILOSEC) 20 MG capsule Take 20 mg by mouth daily.  Marland Kitchen oxybutynin (DITROPAN-XL) 10 MG 24 hr tablet Take 10 mg by mouth daily.  . rosuvastatin (CRESTOR) 5 MG tablet Take 5 mg by mouth daily.  Marland Kitchen senna-docusate (SENOKOT-S) 8.6-50 MG tablet Take 1 tablet by mouth 2 (two) times daily between meals as needed for moderate constipation.  . sitaGLIPtin (JANUVIA) 25 MG tablet Take 25 mg by mouth daily after supper.   No facility-administered encounter medications on file as of 11/27/2020.    PHYSICAL EXAM:   General: NAD, frail appearing, thin, deconditioned, hoh female Cardiovascular: regular rate and rhythm Pulmonary: clear ant fields Extremities: no edema, left humerus displaced Skin: stage 2 mid back; red area top of sacrum; stage 2 to posterior left arm Neurological: non-ambulatory  Fayne Mcguffee [place], NP

## 2020-12-01 ENCOUNTER — Other Ambulatory Visit
Admission: RE | Admit: 2020-12-01 | Discharge: 2020-12-01 | Disposition: A | Discharge status: Home / Self Care | Payer: Medicare Other | Source: Ambulatory Visit | Attending: Surgery | Admitting: Surgery

## 2020-12-01 ENCOUNTER — Other Ambulatory Visit: Payer: Self-pay | Admitting: Surgery

## 2020-12-01 ENCOUNTER — Other Ambulatory Visit: Payer: Self-pay

## 2020-12-01 DIAGNOSIS — Z01812 Encounter for preprocedural laboratory examination: Secondary | ICD-10-CM | POA: Insufficient documentation

## 2020-12-01 DIAGNOSIS — Z20822 Contact with and (suspected) exposure to covid-19: Secondary | ICD-10-CM | POA: Diagnosis not present

## 2020-12-02 LAB — SARS CORONAVIRUS 2 (TAT 6-24 HRS): SARS Coronavirus 2: NEGATIVE

## 2020-12-03 ENCOUNTER — Encounter
Admission: RE | Admit: 2020-12-03 | Discharge: 2020-12-03 | Disposition: A | Discharge status: Home / Self Care | Payer: Medicare Other | Source: Ambulatory Visit | Attending: Surgery | Admitting: Surgery

## 2020-12-03 ENCOUNTER — Other Ambulatory Visit: Payer: Self-pay

## 2020-12-03 HISTORY — DX: Parkinson's disease: G20

## 2020-12-03 HISTORY — DX: Hyperlipidemia, unspecified: E78.5

## 2020-12-03 HISTORY — DX: Personal history of other diseases of the digestive system: Z87.19

## 2020-12-03 HISTORY — DX: Unspecified dementia, unspecified severity, without behavioral disturbance, psychotic disturbance, mood disturbance, and anxiety: F03.90

## 2020-12-03 HISTORY — DX: Anxiety disorder, unspecified: F41.9

## 2020-12-03 HISTORY — DX: Parkinson's disease without dyskinesia, without mention of fluctuations: G20.A1

## 2020-12-03 HISTORY — DX: Anemia, unspecified: D64.9

## 2020-12-03 NOTE — Patient Instructions (Addendum)
Your procedure is scheduled on:  Thursday, March 10 Report to the Registration Desk on the 1st floor of the [facility] To find out your arrival time, please call [phone] between 1PM - 3PM on: Wednesday, March 9  REMEMBER: Instructions that are not followed completely may result in serious medical risk, up to and including death; or upon the discretion of your surgeon and anesthesiologist your surgery may need to be rescheduled.  Do not eat food after midnight the night before surgery.  No gum chewing, lozengers or hard candies.  You may however, drink water up to 2 hours before you are scheduled to arrive for your surgery. Do not drink anything within 2 hours of your scheduled arrival time.  TAKE THESE MEDICATIONS THE MORNING OF SURGERY WITH A SIP OF WATER:  1.  memantine (namenda) 2.  Alprazolam (Xanax) if needed for anxiety 3.  prilosec - (take one the night before and one on the morning of surgery - helps to prevent nausea after surgery.) 4.  Hydrocodone if needed for pain  One week prior to surgery: starting today, march 9 Stop Anti-inflammatories (NSAIDS) such as Advil, Aleve, Ibuprofen, Motrin, Naproxen, Naprosyn and Aspirin based products such as Excedrin, Goodys Powder, BC Powder. Stop ANY OVER THE COUNTER supplements until after surgery.  No Alcohol for 24 hours before or after surgery.  On the morning of surgery brush your teeth with toothpaste and water, you may rinse your mouth with mouthwash if you wish. Do not swallow any toothpaste or mouthwash.  Do not wear jewelry, make-up, hairpins, clips or nail polish.  Do not wear lotions, powders, or perfumes.   Do not shave body from the neck down 48 hours prior to surgery just in case you cut yourself which could leave a site for infection.  Also, freshly shaved skin may become irritated if using the CHG soap.  Contact lenses, hearing aids and dentures may not be worn into surgery.  Do not bring valuables to the  hospital. [facility] is not responsible for any missing/lost belongings or valuables.   Notify your doctor if there is any change in your medical condition (cold, fever, infection).  Wear comfortable clothing (specific to your surgery type) to the hospital.  Plan for stool softeners for home use; pain medications have a tendency to cause constipation. You can also help prevent constipation by eating foods high in fiber such as fruits and vegetables and drinking plenty of fluids as your diet allows.  After surgery, you can help prevent lung complications by doing breathing exercises.  Take deep breaths and cough every 1-2 hours. Your doctor may order a device called an Incentive Spirometer to help you take deep breaths.  If you are being admitted to the hospital overnight, leave your suitcase in the car. After surgery it may be brought to your room.  If you are being discharged the day of surgery, you will not be allowed to drive home. You will need a responsible adult (18 years or older) to drive you home and stay with you that night.   If you are taking public transportation, you will need to have a responsible adult (18 years or older) with you. Please confirm with your physician that it is acceptable to use public transportation.   Please call the Pre-admissions Testing Dept. at [phone] if you have any questions about these instructions.  Surgery Visitation Policy:  Patients undergoing a surgery or procedure may have one family member or support person  with them as long as that person is not COVID-19 positive or experiencing its symptoms.  That person may remain in the waiting area during the procedure.  Inpatient Visitation:    Visiting hours are 7 a.m. to 8 p.m. Inpatients will be allowed two visitors daily. The visitors may change each day during the patient's stay. No visitors under the age of 51. Any visitor under the age of 91 must be accompanied by an adult. The  visitor must pass COVID-19 screenings, use hand sanitizer when entering and exiting the patient's room and wear a mask at all times, including in the patient's room. Patients must also wear a mask when staff or their visitor are in the room. Masking is required regardless of vaccination status.

## 2020-12-04 ENCOUNTER — Ambulatory Visit: Payer: Medicare Other | Admitting: Anesthesiology

## 2020-12-04 ENCOUNTER — Encounter: Payer: Self-pay | Admitting: Surgery

## 2020-12-04 ENCOUNTER — Ambulatory Visit
Admission: RE | Admit: 2020-12-04 | Discharge: 2020-12-04 | Disposition: A | Discharge status: Home / Self Care | Payer: Medicare Other | Attending: Surgery | Admitting: Surgery

## 2020-12-04 ENCOUNTER — Ambulatory Visit: Payer: Medicare Other

## 2020-12-04 ENCOUNTER — Encounter
Admission: RE | Disposition: A | Discharge status: Home / Self Care | Payer: Self-pay | Source: Home / Self Care | Attending: Surgery

## 2020-12-04 DIAGNOSIS — Z7984 Long term (current) use of oral hypoglycemic drugs: Secondary | ICD-10-CM | POA: Diagnosis not present

## 2020-12-04 DIAGNOSIS — G20 Parkinson's disease: Secondary | ICD-10-CM | POA: Insufficient documentation

## 2020-12-04 DIAGNOSIS — Z888 Allergy status to other drugs, medicaments and biological substances status: Secondary | ICD-10-CM | POA: Diagnosis not present

## 2020-12-04 DIAGNOSIS — Z833 Family history of diabetes mellitus: Secondary | ICD-10-CM | POA: Insufficient documentation

## 2020-12-04 DIAGNOSIS — I2510 Atherosclerotic heart disease of native coronary artery without angina pectoris: Secondary | ICD-10-CM | POA: Diagnosis not present

## 2020-12-04 DIAGNOSIS — Z96612 Presence of left artificial shoulder joint: Secondary | ICD-10-CM

## 2020-12-04 DIAGNOSIS — F0280 Dementia in other diseases classified elsewhere without behavioral disturbance: Secondary | ICD-10-CM | POA: Insufficient documentation

## 2020-12-04 DIAGNOSIS — X58XXXA Exposure to other specified factors, initial encounter: Secondary | ICD-10-CM | POA: Diagnosis not present

## 2020-12-04 DIAGNOSIS — Z79899 Other long term (current) drug therapy: Secondary | ICD-10-CM | POA: Diagnosis not present

## 2020-12-04 DIAGNOSIS — Z7982 Long term (current) use of aspirin: Secondary | ICD-10-CM | POA: Diagnosis not present

## 2020-12-04 DIAGNOSIS — E1122 Type 2 diabetes mellitus with diabetic chronic kidney disease: Secondary | ICD-10-CM | POA: Insufficient documentation

## 2020-12-04 DIAGNOSIS — N183 Chronic kidney disease, stage 3 unspecified: Secondary | ICD-10-CM | POA: Diagnosis not present

## 2020-12-04 DIAGNOSIS — K589 Irritable bowel syndrome without diarrhea: Secondary | ICD-10-CM | POA: Diagnosis not present

## 2020-12-04 DIAGNOSIS — E785 Hyperlipidemia, unspecified: Secondary | ICD-10-CM | POA: Diagnosis not present

## 2020-12-04 DIAGNOSIS — Z419 Encounter for procedure for purposes other than remedying health state, unspecified: Secondary | ICD-10-CM

## 2020-12-04 DIAGNOSIS — Z881 Allergy status to other antibiotic agents status: Secondary | ICD-10-CM | POA: Insufficient documentation

## 2020-12-04 DIAGNOSIS — S42222A 2-part displaced fracture of surgical neck of left humerus, initial encounter for closed fracture: Secondary | ICD-10-CM | POA: Insufficient documentation

## 2020-12-04 DIAGNOSIS — Z89422 Acquired absence of other left toe(s): Secondary | ICD-10-CM | POA: Diagnosis not present

## 2020-12-04 DIAGNOSIS — H548 Legal blindness, as defined in USA: Secondary | ICD-10-CM | POA: Insufficient documentation

## 2020-12-04 DIAGNOSIS — Z880 Allergy status to penicillin: Secondary | ICD-10-CM | POA: Diagnosis not present

## 2020-12-04 DIAGNOSIS — I129 Hypertensive chronic kidney disease with stage 1 through stage 4 chronic kidney disease, or unspecified chronic kidney disease: Secondary | ICD-10-CM | POA: Diagnosis not present

## 2020-12-04 HISTORY — PX: REVERSE SHOULDER ARTHROPLASTY: SHX5054

## 2020-12-04 LAB — GLUCOSE, CAPILLARY
Glucose-Capillary: 116 mg/dL — ABNORMAL HIGH (ref 70–99)
Glucose-Capillary: 146 mg/dL — ABNORMAL HIGH (ref 70–99)

## 2020-12-04 LAB — URINALYSIS, ROUTINE W REFLEX MICROSCOPIC
Bacteria, UA: NONE SEEN
Bilirubin Urine: NEGATIVE
Glucose, UA: NEGATIVE mg/dL
Hgb urine dipstick: NEGATIVE
Ketones, ur: 5 mg/dL — AB
Nitrite: NEGATIVE
Protein, ur: NEGATIVE mg/dL
Specific Gravity, Urine: 1.017 (ref 1.005–1.030)
pH: 6 (ref 5.0–8.0)

## 2020-12-04 LAB — SURGICAL PCR SCREEN
MRSA, PCR: NEGATIVE
Staphylococcus aureus: NEGATIVE

## 2020-12-04 SURGERY — ARTHROPLASTY, SHOULDER, TOTAL, REVERSE
Anesthesia: General | Site: Shoulder | Laterality: Left

## 2020-12-04 MED ORDER — ONDANSETRON HCL 4 MG/2ML IJ SOLN: 4.0000 mg | Freq: Once | INTRAMUSCULAR | Status: DC | PRN

## 2020-12-04 MED ORDER — FENTANYL CITRATE (PF) 100 MCG/2ML IJ SOLN: 25.0000 ug | INTRAMUSCULAR | Status: DC | PRN

## 2020-12-04 MED ORDER — BUPIVACAINE HCL (PF) 0.5 % IJ SOLN
INTRAMUSCULAR | Status: AC
  Filled 2020-12-04: qty 30

## 2020-12-04 MED ORDER — CEFAZOLIN SODIUM-DEXTROSE 2-4 GM/100ML-% IV SOLN: 2.0000 g | Freq: Four times a day (QID) | INTRAVENOUS | Status: DC

## 2020-12-04 MED ORDER — EPINEPHRINE PF 1 MG/ML IJ SOLN
INTRAMUSCULAR | Status: AC
  Filled 2020-12-04: qty 1

## 2020-12-04 MED ORDER — PROPOFOL 10 MG/ML IV BOLUS
INTRAVENOUS | Status: DC | PRN
  Administered 2020-12-04: 20 mg via INTRAVENOUS
  Administered 2020-12-04: 50 mg via INTRAVENOUS

## 2020-12-04 MED ORDER — BUPIVACAINE-EPINEPHRINE (PF) 0.5% -1:200000 IJ SOLN
INTRAMUSCULAR | Status: DC | PRN
  Administered 2020-12-04: 30 mL

## 2020-12-04 MED ORDER — FLUMAZENIL 0.5 MG/5ML IV SOLN
0.2000 mg | Freq: Once | INTRAVENOUS | Status: AC
  Administered 2020-12-04: 0.2 mg via INTRAVENOUS

## 2020-12-04 MED ORDER — FENTANYL CITRATE (PF) 100 MCG/2ML IJ SOLN
INTRAMUSCULAR | Status: DC | PRN
  Administered 2020-12-04 (×2): 25 ug via INTRAVENOUS
  Administered 2020-12-04: 50 ug via INTRAVENOUS

## 2020-12-04 MED ORDER — EPHEDRINE 5 MG/ML INJ
INTRAVENOUS | Status: AC
  Filled 2020-12-04: qty 10

## 2020-12-04 MED ORDER — SUGAMMADEX SODIUM 500 MG/5ML IV SOLN
INTRAVENOUS | Status: AC
  Filled 2020-12-04: qty 5

## 2020-12-04 MED ORDER — ROCURONIUM BROMIDE 100 MG/10ML IV SOLN
INTRAVENOUS | Status: DC | PRN
  Administered 2020-12-04: 40 mg via INTRAVENOUS
  Administered 2020-12-04: 10 mg via INTRAVENOUS
  Administered 2020-12-04: 20 mg via INTRAVENOUS
  Administered 2020-12-04: 10 mg via INTRAVENOUS

## 2020-12-04 MED ORDER — DEXMEDETOMIDINE (PRECEDEX) IN NS 20 MCG/5ML (4 MCG/ML) IV SYRINGE
PREFILLED_SYRINGE | INTRAVENOUS | Status: DC | PRN
  Administered 2020-12-04 (×2): 4 ug via INTRAVENOUS

## 2020-12-04 MED ORDER — LIDOCAINE HCL (PF) 1 % IJ SOLN
INTRAMUSCULAR | Status: AC
  Filled 2020-12-04: qty 5

## 2020-12-04 MED ORDER — DEXMEDETOMIDINE (PRECEDEX) IN NS 20 MCG/5ML (4 MCG/ML) IV SYRINGE
PREFILLED_SYRINGE | INTRAVENOUS | Status: AC
  Filled 2020-12-04: qty 5

## 2020-12-04 MED ORDER — CLINDAMYCIN PHOSPHATE 600 MG/50ML IV SOLN: 600.0000 mg | Freq: Once | INTRAVENOUS | Status: AC

## 2020-12-04 MED ORDER — LIDOCAINE HCL (PF) 2 % IJ SOLN
INTRAMUSCULAR | Status: AC
  Filled 2020-12-04: qty 5

## 2020-12-04 MED ORDER — FLUMAZENIL 0.5 MG/5ML IV SOLN
INTRAVENOUS | Status: AC
  Filled 2020-12-04: qty 5

## 2020-12-04 MED ORDER — TRANEXAMIC ACID 1000 MG/10ML IV SOLN
INTRAVENOUS | Status: AC
  Filled 2020-12-04: qty 10

## 2020-12-04 MED ORDER — FENTANYL CITRATE (PF) 100 MCG/2ML IJ SOLN
INTRAMUSCULAR | Status: AC
  Filled 2020-12-04: qty 2

## 2020-12-04 MED ORDER — OXYCODONE HCL 5 MG PO TABS: 5.0000 mg | ORAL_TABLET | Freq: Four times a day (QID) | ORAL | 0 refills | Status: AC | PRN

## 2020-12-04 MED ORDER — BUPIVACAINE HCL (PF) 0.5 % IJ SOLN
INTRAMUSCULAR | Status: AC
  Filled 2020-12-04: qty 10

## 2020-12-04 MED ORDER — LIDOCAINE HCL (CARDIAC) PF 100 MG/5ML IV SOSY
PREFILLED_SYRINGE | INTRAVENOUS | Status: DC | PRN
  Administered 2020-12-04: 50 mg via INTRAVENOUS

## 2020-12-04 MED ORDER — CLINDAMYCIN PHOSPHATE 900 MG/50ML IV SOLN
INTRAVENOUS | Status: AC
  Filled 2020-12-04: qty 50

## 2020-12-04 MED ORDER — HYDROCODONE-ACETAMINOPHEN 7.5-325 MG PO TABS: 1.0000 | ORAL_TABLET | ORAL | Status: DC | PRN

## 2020-12-04 MED ORDER — PHENYLEPHRINE HCL (PRESSORS) 10 MG/ML IV SOLN
INTRAVENOUS | Status: DC | PRN
  Administered 2020-12-04 (×2): 50 ug via INTRAVENOUS
  Administered 2020-12-04 (×2): 100 ug via INTRAVENOUS
  Administered 2020-12-04: 50 ug via INTRAVENOUS

## 2020-12-04 MED ORDER — ONDANSETRON HCL 4 MG/2ML IJ SOLN
INTRAMUSCULAR | Status: DC | PRN
  Administered 2020-12-04: 4 mg via INTRAVENOUS

## 2020-12-04 MED ORDER — CLINDAMYCIN PHOSPHATE 900 MG/50ML IV SOLN
900.0000 mg | INTRAVENOUS | Status: AC
  Administered 2020-12-04: 900 mg via INTRAVENOUS

## 2020-12-04 MED ORDER — SODIUM CHLORIDE 0.9 % IV SOLN: INTRAVENOUS | Status: DC

## 2020-12-04 MED ORDER — BUPIVACAINE HCL (PF) 0.5 % IJ SOLN
INTRAMUSCULAR | Status: DC | PRN
  Administered 2020-12-04: 10 mL

## 2020-12-04 MED ORDER — EPHEDRINE SULFATE 50 MG/ML IJ SOLN
INTRAMUSCULAR | Status: DC | PRN
  Administered 2020-12-04: 5 mg via INTRAVENOUS
  Administered 2020-12-04: 10 mg via INTRAVENOUS

## 2020-12-04 MED ORDER — ONDANSETRON HCL 4 MG/2ML IJ SOLN
INTRAMUSCULAR | Status: AC
  Filled 2020-12-04: qty 6

## 2020-12-04 MED ORDER — ORAL CARE MOUTH RINSE: 15.0000 mL | Freq: Once | OROMUCOSAL | Status: DC

## 2020-12-04 MED ORDER — ONDANSETRON HCL 4 MG PO TABS: 4.0000 mg | ORAL_TABLET | Freq: Four times a day (QID) | ORAL | Status: DC | PRN

## 2020-12-04 MED ORDER — SODIUM CHLORIDE 0.9 % IV SOLN
INTRAVENOUS | Status: DC | PRN
  Administered 2020-12-04: 30 ug/min via INTRAVENOUS

## 2020-12-04 MED ORDER — MORPHINE SULFATE (PF) 2 MG/ML IV SOLN: 0.5000 mg | INTRAVENOUS | Status: DC | PRN

## 2020-12-04 MED ORDER — CLINDAMYCIN PHOSPHATE 600 MG/50ML IV SOLN
INTRAVENOUS | Status: AC
  Administered 2020-12-04: 600 mg via INTRAVENOUS
  Filled 2020-12-04: qty 50

## 2020-12-04 MED ORDER — GLYCOPYRROLATE 0.2 MG/ML IJ SOLN
INTRAMUSCULAR | Status: DC | PRN
  Administered 2020-12-04: .2 mg via INTRAVENOUS

## 2020-12-04 MED ORDER — CHLORHEXIDINE GLUCONATE 0.12 % MT SOLN: 15.0000 mL | Freq: Once | OROMUCOSAL | Status: DC

## 2020-12-04 MED ORDER — HYDROCODONE-ACETAMINOPHEN 5-325 MG PO TABS: 1.0000 | ORAL_TABLET | ORAL | Status: DC | PRN

## 2020-12-04 MED ORDER — ACETAMINOPHEN 325 MG PO TABS: 325.0000 mg | ORAL_TABLET | Freq: Four times a day (QID) | ORAL | Status: DC | PRN

## 2020-12-04 MED ORDER — PROPOFOL 10 MG/ML IV BOLUS
INTRAVENOUS | Status: AC
  Filled 2020-12-04: qty 20

## 2020-12-04 MED ORDER — BUPIVACAINE LIPOSOME 1.3 % IJ SUSP
INTRAMUSCULAR | Status: DC | PRN
  Administered 2020-12-04: 15 mL

## 2020-12-04 MED ORDER — BUPIVACAINE LIPOSOME 1.3 % IJ SUSP
INTRAMUSCULAR | Status: AC
  Filled 2020-12-04: qty 20

## 2020-12-04 MED ORDER — TRANEXAMIC ACID 1000 MG/10ML IV SOLN
INTRAVENOUS | Status: DC | PRN
  Administered 2020-12-04: 10 mg via INTRAVENOUS

## 2020-12-04 MED ORDER — ONDANSETRON HCL 4 MG/2ML IJ SOLN: 4.0000 mg | Freq: Four times a day (QID) | INTRAMUSCULAR | Status: DC | PRN

## 2020-12-04 MED ORDER — ROCURONIUM BROMIDE 10 MG/ML (PF) SYRINGE
PREFILLED_SYRINGE | INTRAVENOUS | Status: AC
  Filled 2020-12-04: qty 10

## 2020-12-04 MED ORDER — METOCLOPRAMIDE HCL 5 MG/ML IJ SOLN: 5.0000 mg | Freq: Three times a day (TID) | INTRAMUSCULAR | Status: DC | PRN

## 2020-12-04 MED ORDER — DEXAMETHASONE SODIUM PHOSPHATE 10 MG/ML IJ SOLN
INTRAMUSCULAR | Status: AC
  Filled 2020-12-04: qty 3

## 2020-12-04 MED ORDER — DEXAMETHASONE SODIUM PHOSPHATE 10 MG/ML IJ SOLN
INTRAMUSCULAR | Status: DC | PRN
  Administered 2020-12-04: 5 mg via INTRAVENOUS

## 2020-12-04 MED ORDER — METOCLOPRAMIDE HCL 10 MG PO TABS: 5.0000 mg | ORAL_TABLET | Freq: Three times a day (TID) | ORAL | Status: DC | PRN

## 2020-12-04 MED ORDER — GLYCOPYRROLATE 0.2 MG/ML IJ SOLN
INTRAMUSCULAR | Status: AC
  Filled 2020-12-04: qty 2

## 2020-12-04 MED ORDER — SUGAMMADEX SODIUM 200 MG/2ML IV SOLN
INTRAVENOUS | Status: DC | PRN
  Administered 2020-12-04: 200 mg via INTRAVENOUS

## 2020-12-04 SURGICAL SUPPLY — 74 items
APL PRP STRL LF DISP 70% ISPRP (MISCELLANEOUS) ×1
BASEPLATE BOSS DRILL (MISCELLANEOUS) ×2 IMPLANT
BIT DRILL 2.5 (BIT) ×2
BIT DRILL 2.5X4.5XSCR (BIT) ×1 IMPLANT
BIT DRILL BASEPLATE CENTRAL S (BIT) ×2 IMPLANT
BIT DRL 2.5X4.5XSCR (BIT) ×1
BLADE SAW SAG 25X90X1.19 (BLADE) ×2 IMPLANT
BSPLAT GLND THK2 22.8X27.3 (Plate) ×1 IMPLANT
CANISTER SUCT 1200ML W/VALVE (MISCELLANEOUS) ×2 IMPLANT
CANISTER SUCT 3000ML PPV (MISCELLANEOUS) ×4 IMPLANT
CHLORAPREP W/TINT 26 (MISCELLANEOUS) ×2 IMPLANT
COOLER POLAR GLACIER W/PUMP (MISCELLANEOUS) ×2 IMPLANT
COVER BACK TABLE REUSABLE LG (DRAPES) ×2 IMPLANT
COVER WAND RF STERILE (DRAPES) ×2 IMPLANT
DRAPE 3/4 80X56 (DRAPES) ×4 IMPLANT
DRAPE IMP U-DRAPE 54X76 (DRAPES) ×4 IMPLANT
DRAPE INCISE IOBAN 66X45 STRL (DRAPES) ×4 IMPLANT
DRSG OPSITE POSTOP 4X8 (GAUZE/BANDAGES/DRESSINGS) ×4 IMPLANT
ELECT BLADE 6.5 EXT (BLADE) IMPLANT
ELECT CAUTERY BLADE 6.4 (BLADE) ×2 IMPLANT
GAUZE XEROFORM 1X8 LF (GAUZE/BANDAGES/DRESSINGS) ×2 IMPLANT
GLENOSPHERE RSS 2 CONCENTRIC (Shoulder) ×2 IMPLANT
GLOVE INDICATOR 8.0 STRL GRN (GLOVE) ×2 IMPLANT
GLOVE SRG 8 PF TXTR STRL LF DI (GLOVE) ×1 IMPLANT
GLOVE SURG ENC MOIS LTX SZ7.5 (GLOVE) ×8 IMPLANT
GLOVE SURG ENC MOIS LTX SZ8 (GLOVE) ×8 IMPLANT
GLOVE SURG UNDER POLY LF SZ8 (GLOVE) ×2
GOWN STRL REUS W/ TWL LRG LVL3 (GOWN DISPOSABLE) ×1 IMPLANT
GOWN STRL REUS W/ TWL XL LVL3 (GOWN DISPOSABLE) ×1 IMPLANT
GOWN STRL REUS W/TWL LRG LVL3 (GOWN DISPOSABLE) ×2
GOWN STRL REUS W/TWL XL LVL3 (GOWN DISPOSABLE) ×2
GUIDE PIN 2.0 S150MM (PIN) ×4 IMPLANT
HOOD PEEL AWAY FLYTE STAYCOOL (MISCELLANEOUS) ×8 IMPLANT
ILLUMINATOR WAVEGUIDE N/F (MISCELLANEOUS) IMPLANT
KIT STABILIZATION SHOULDER (MISCELLANEOUS) ×2 IMPLANT
KIT TURNOVER KIT A (KITS) ×2 IMPLANT
LINER STD +3S RSS HXL (Liner) ×2 IMPLANT
MANIFOLD NEPTUNE II (INSTRUMENTS) ×2 IMPLANT
MASK FACE SPIDER DISP (MASK) ×2 IMPLANT
MAT ABSORB  FLUID 56X50 GRAY (MISCELLANEOUS) ×2
MAT ABSORB FLUID 56X50 GRAY (MISCELLANEOUS) ×1 IMPLANT
NDL SAFETY ECLIPSE 18X1.5 (NEEDLE) ×1 IMPLANT
NEEDLE HYPO 18GX1.5 SHARP (NEEDLE) ×2
NEEDLE HYPO 22GX1.5 SAFETY (NEEDLE) ×2 IMPLANT
NEEDLE SPNL 20GX3.5 QUINCKE YW (NEEDLE) ×2 IMPLANT
NS IRRIG 500ML POUR BTL (IV SOLUTION) ×2 IMPLANT
PACK ARTHROSCOPY SHOULDER (MISCELLANEOUS) ×2 IMPLANT
PAD ARMBOARD 7.5X6 YLW CONV (MISCELLANEOUS) ×2 IMPLANT
PAD WRAPON POLAR SHDR UNIV (MISCELLANEOUS) ×1 IMPLANT
PENCIL SMOKE EVACUATOR (MISCELLANEOUS) ×2 IMPLANT
PLATE BASE REVERSE RSS S (Plate) ×2 IMPLANT
PULSAVAC PLUS IRRIG FAN TIP (DISPOSABLE) ×2
SCREW 4.5X15 RSS W CAP (Screw) ×4 IMPLANT
SCREW 4.5X20 RSS W CAP (Screw) ×2 IMPLANT
SCREW 4.5X30 RSS W CAP (Screw) ×2 IMPLANT
SCREW 4.5X50 RSS W CAP (Screw) ×2 IMPLANT
SCREW BN 40X4.5XSTAR CAP (Screw) ×1 IMPLANT
SCREW BODY REVERSE SMALL TITAN (Screw) ×2 IMPLANT
SLING ULTRA II M (MISCELLANEOUS) ×2 IMPLANT
SOL .9 NS 3000ML IRR  AL (IV SOLUTION) ×2
SOL .9 NS 3000ML IRR AL (IV SOLUTION) ×1
SOL .9 NS 3000ML IRR UROMATIC (IV SOLUTION) ×1 IMPLANT
SPONGE LAP 18X18 RF (DISPOSABLE) ×2 IMPLANT
STAPLER SKIN PROX 35W (STAPLE) ×2 IMPLANT
STEM HUM 11 (Stem) ×2 IMPLANT
SUT ETHIBOND 0 MO6 C/R (SUTURE) ×2 IMPLANT
SUT FIBERWIRE #2 38 BLUE 1/2 (SUTURE) ×10
SUT VIC AB 0 CT1 36 (SUTURE) ×2 IMPLANT
SUT VIC AB 2-0 CT1 27 (SUTURE) ×4
SUT VIC AB 2-0 CT1 TAPERPNT 27 (SUTURE) ×2 IMPLANT
SUTURE FIBERWR #2 38 BLUE 1/2 (SUTURE) ×5 IMPLANT
SYR 10ML LL (SYRINGE) ×2 IMPLANT
TIP FAN IRRIG PULSAVAC PLUS (DISPOSABLE) ×1 IMPLANT
WRAPON POLAR PAD SHDR UNIV (MISCELLANEOUS) ×2

## 2020-12-04 NOTE — Progress Notes (Signed)
Dr. Joice Lofts made aware of UA results prior to surgery

## 2020-12-04 NOTE — Anesthesia Procedure Notes (Signed)
Procedure Name: Intubation Date/Time: 12/04/2020 10:43 AM Performed by: Joanette Gula, Sharon Stapel, CRNA Pre-anesthesia Checklist: Patient identified, Emergency Drugs available, Suction available and Patient being monitored Patient Re-evaluated:Patient Re-evaluated prior to induction Oxygen Delivery Method: Circle system utilized Preoxygenation: Pre-oxygenation with 100% oxygen Induction Type: IV induction Ventilation: Mask ventilation without difficulty Laryngoscope Size: McGraph and 3 Grade View: Grade I Tube type: Oral Tube size: 6.5 mm Number of attempts: 1 Airway Equipment and Method: Stylet Placement Confirmation: ETT inserted through vocal cords under direct vision,  positive ETCO2 and breath sounds checked- equal and bilateral Secured at: 21 cm Tube secured with: Tape Dental Injury: Teeth and Oropharynx as per pre-operative assessment

## 2020-12-04 NOTE — Transfer of Care (Signed)
Immediate Anesthesia Transfer of Care Note  Patient: Carrie Glenn  Procedure(s) Performed: REVERSE LEFT TOTAL SHOULDER ARTHROPLASTY (Left Shoulder)  Patient Location: PACU  Anesthesia Type:General  Level of Consciousness: drowsy  Airway & Oxygen Therapy: Patient Spontanous Breathing and Patient connected to face mask oxygen  Post-op Assessment: Report given to RN and Post -op Vital signs reviewed and stable  Post vital signs: Reviewed and stable  Last Vitals:  Vitals Value Taken Time  BP 92/63 12/04/20 1332  Temp 36 C 12/04/20 1330  Pulse 70 12/04/20 1335  Resp 9 12/04/20 1335  SpO2 100 % 12/04/20 1335  Vitals shown include unvalidated device data.  Last Pain:  Vitals:   12/04/20 0857  TempSrc: Temporal         Complications: No complications documented.

## 2020-12-04 NOTE — Anesthesia Preprocedure Evaluation (Addendum)
Anesthesia Evaluation  Patient identified by MRN, date of birth, ID band Patient awake    Reviewed: Allergy & Precautions, NPO status , Patient's Chart, lab work & pertinent test results  Airway Mallampati: III       Dental   Pulmonary neg pulmonary ROS,    Pulmonary exam normal        Cardiovascular hypertension, Normal cardiovascular exam     Neuro/Psych PSYCHIATRIC DISORDERS Anxiety Dementia negative neurological ROS     GI/Hepatic hiatal hernia, GERD  ,  Endo/Other  diabetes  Renal/GU Renal disease     Musculoskeletal  (+) Arthritis ,   Abdominal Normal abdominal exam  (+)   Peds negative pediatric ROS (+)  Hematology  (+) anemia ,   Anesthesia Other Findings Past Medical History: 04/09/2015: Acute encephalopathy 03/09/2015: Acute renal failure (HCC) 03/08/2015: Acute UTI No date: Anemia No date: Anxiety No date: Arthritis 07/10/2014: Awareness of heartbeats No date: Blind     Comment:  Legally 03/08/2015: Chronic back pain 06/10/2015: Controlled type 2 diabetes mellitus without complication  (HCC) 09/04/2015: Degeneration of intervertebral disc of lumbar region 08/15/2014: Degenerative arthritis of lumbar spine No date: Dementia (HCC) 04/07/2015: Diabetes (HCC) No date: Diabetes mellitus without complication (HCC) 05/23/2014: Difficult or painful urination 01/31/2014: Excessive urination at night 04/09/2015: Frequent falls 09/19/2015: Gastro-esophageal reflux disease without esophagitis No date: Hard of hearing 04/09/2015: HCAP (healthcare-associated pneumonia) No date: History of hiatal hernia 04/07/2015: HTN (hypertension) No date: Hyperlipidemia No date: Hypertension 03/08/2015: Hypotension No date: Osteoporosis No date: Pain     Comment:  Chronic back No date: Parkinson disease (HCC) No date: Retinitis pigmentosa of both eyes 11/28/2014: Type 2 diabetes mellitus (HCC)  Reproductive/Obstetrics                              Anesthesia Physical Anesthesia Plan  ASA: III  Anesthesia Plan: General   Post-op Pain Management:  Regional for Post-op pain   Induction: Intravenous  PONV Risk Score and Plan:   Airway Management Planned: Oral ETT  Additional Equipment:   Intra-op Plan:   Post-operative Plan: Extubation in OR  Informed Consent: I have reviewed the patients History and Physical, chart, labs and discussed the procedure including the risks, benefits and alternatives for the proposed anesthesia with the patient or authorized representative who has indicated his/her understanding and acceptance.     Dental advisory given  Plan Discussed with: CRNA and Surgeon  Anesthesia Plan Comments: (Talked with daughter about an interscalene nerve block with exparel and marcaine.  All risks discussed which includes cardiac arrest, seizures, pneumothorax, nerve damage, infection, difficulty breathing and block not working.  Patient and family accepts risks and wishes to procede with the block as per request of surgeon.)       Anesthesia Quick Evaluation

## 2020-12-04 NOTE — Op Note (Signed)
12/04/2020  1:47 PM  Patient:   Carrie Glenn  Pre-Op Diagnosis:   Closed displaced 2-part surgical neck fracture with bone threatening to protrude through skin, left shoulder.  Post-Op Diagnosis:   Same.  Procedure:   Reverse left total shoulder arthroplasty.  Surgeon:   Maryagnes Amos, MD  Assistant:   Griffin Basil, RNFA; Arcola Jansky, PA-S  Anesthesia:   General endotracheal with an interscalene block using Exparel placed preoperatively by the anesthesiologist.  Findings:   As above.  Complications:   None  EBL:   125 cc  Fluids:   900 cc crystalloid  UOP:   None  TT:   None  Drains:   None  Closure:   Staples  Implants:   All press-fit Integra system with an 11 mm stem, a small metaphyseal body, a +3 mm humeral platform, a mini baseplate, and a 38 mm concentric +2 mm laterally offset glenosphere.  Brief Clinical Note:   The patient is an 80 year old female who sustained a closed displaced 2-part surgical neck fracture of her left shoulder on 11/02/2020. Initially, her fracture was felt to be able to be managed nonsurgically. However, upon her first follow-up visit in our office several days ago, her follow-up x-rays demonstrated significant displacement of the shaft anteriorly with the skin being threatened by the subcutaneous protrusion of her humeral shaft. Because the patient was in so much discomfort and the skin was being threatened, it was felt best to proceed with surgical intervention. The patient presents at this time for a reverse left total shoulder arthroplasty.  Procedure:   The patient underwent placement of an interscalene block using Exparel by the anesthesiologist in the preoperative holding area before being brought into the operating room and lain in the supine position. The patient then underwent general endotracheal intubation and anesthesia before the patient was repositioned in the beach chair position using the beach chair positioner. The left  shoulder and upper extremity were prepped with ChloraPrep solution before being draped sterilely. Preoperative antibiotics were administered.   A timeout was performed to verify the appropriate surgical site before a standard anterior approach to the shoulder was made through an approximately 4-5 inch incision. The incision was carried down through the subcutaneous tissues to expose the deltopectoral fascia. The interval between the deltoid and pectoralis muscles was identified and this plane developed, retracting the cephalic vein laterally with the deltoid muscle. The conjoined tendon was identified. Its lateral margin was dissected and the Kolbel self-retraining retractor inserted.    The biceps tendon was identified and a soft tissue tenodesis performed, tacking the biceps tendon to the adjacent pectoralis major tendon with two #0 Ethibond sutures before releasing the biceps tendon. The biceps tendon was followed up through the bicipital groove and the rotator interval opened to expose the joint. Further dissection was performed permitted better mobilization of the proximal humeral shaft, as well as access to the humeral head. The bicipital groove was used to osteotomize the humeral head, removing the lesser tuberosity from the remainder of the humeral head. Several tacking sutures were placed in the subscapularis tendon and it was retracted medially. The humeral head was removed in several pieces and additional tacking sutures placed through the remainder of the supraspinatus/greater tuberosity tissues.   Attention was directed to the glenoid. The labrum was debrided circumferentially before the center of the glenoid was identified. The guidewire was drilled into the glenoid neck using the appropriate guide. After verifying its position, it was overreamed with  the mini-baseplate reamer to create a flat surface before the stem reamer was utilized. The superior and inferior peg sites were reamed using the  appropriate guide to complete the glenoid preparation. The permanent mini-baseplate was impacted into place. It was stabilized with a 25 x 4.5 mm central screw and four peripheral screws. Locking caps were placed over the superior and inferior screws. The permanent 38 mm concentric glenosphere with +2 mm of lateral offset was then impacted into place and its Morse taper locking mechanism verified using manual distraction.  Attention was directed to the humeral side. The humeral canal was prepared utilizing the tapered stem reamers sequentially beginning with the 7 mm stem and progressing to an 11 mm stem. This demonstrated a good tight fit. The trial stem and small metaphyseal body were put together on the back table and a trial reduction performed using the +0 mm and +3 mm inserts. With the +3 mm insert, the arm demonstrated excellent range of motion as the hand could be brought across the chest to the opposite shoulder and brought to the top of the patient's head and to the patient's ear. The shoulder appeared stable throughout this range of motion. The joint was dislocated and the trial components removed. The permanent 11 mm stem with the small metaphyseal body was impacted into place with care taken to maintain the appropriate version. A repeat trial reduction with the +3 mm insert again demonstrated excellent stability with the findings as described above. Therefore, the shoulder was re-dislocated and, after inserting the locking screw to secure the body to the stem, the permanent +3 mm insert impacted into place. After verifying its locking mechanism, the shoulder was relocated using two finger pressure and again placed through a range of motion with the findings as described above.  The wound was copiously irrigated with sterile saline solution using the jet lavage system before a total of 20 cc of Exparel diluted out to 60 cc with normal saline and 30 cc of 0.5% Sensorcaine with epinephrine was injected  into the pericapsular and peri-incisional tissues to help with postoperative analgesia. The greater and lesser tuberosity fragments were brought back and reattached over the prosthesis. The 2 sutures that have been placed to the humeral shaft prior to placement of the prosthesis were brought up and passed through the greater and lesser portions of the rotator cuff and tied securely to further reinforce this layer of closure. The deltopectoral interval was closed using #0 Vicryl interrupted sutures before the subcutaneous tissues were closed using 2-0 Vicryl interrupted sutures. The skin was closed using staples. Prior to closing the skin, 1 g of transexemic acid in 10 cc of normal saline was injected intra-articularly to help with postoperative bleeding. A sterile occlusive dressing was applied to the wound before the arm was placed into a shoulder immobilizer with an abduction pillow. A Polar Care system also was applied to the shoulder. The patient was then transferred back to a hospital bed before being awakened, extubated, and returned to the recovery room in satisfactory condition after tolerating the procedure well.

## 2020-12-04 NOTE — Discharge Instructions (Addendum)
AMBULATORY SURGERY  DISCHARGE INSTRUCTIONS   1) The drugs that you were given will stay in your system until tomorrow so for the next 24 hours you should not:  A) Drive an automobile B) Make any legal decisions C) Drink any alcoholic beverage   2) You may resume regular meals tomorrow.  Today it is better to start with liquids and gradually work up to solid foods.  You may eat anything you prefer, but it is better to start with liquids, then soup and crackers, and gradually work up to solid foods.   3) Please notify your doctor immediately if you have any unusual bleeding, trouble breathing, redness and pain at the surgery site, drainage, fever, or pain not relieved by medication. 4)   5) Your post-operative visit with Dr.                                     is: Date:                        Time:    Please call to schedule your post-operative visit.  6) Additional Instructions:    Orthopedic discharge instructions: May sponge bathe or shower with intact OpSite dressing. Apply ice frequently to shoulder or use Polar Care device. Take oxycodone as prescribed when needed.  May supplement with ES Tylenol if necessary. Keep shoulder immobilizer on at all times except may remove for bathing purposes. Follow-up in 10-14 days or as scheduled.

## 2020-12-04 NOTE — Anesthesia Procedure Notes (Addendum)
Anesthesia Regional Block: Interscalene brachial plexus block   Pre-Anesthetic Checklist: ,, timeout performed, Correct Patient, Correct Site, Correct Laterality, Correct Procedure, Correct Position, site marked, Risks and benefits discussed,  Surgical consent,  Pre-op evaluation,  At surgeon's request and post-op pain management  Laterality: Left  Prep: chloraprep, alcohol swabs       Needles:  Injection technique: Single-shot  Needle Type: Stimiplex     Needle Length: 5cm  Needle Gauge: 22     Additional Needles:   Procedures:, nerve stimulator,,, ultrasound used (permanent image in chart),,,,   Nerve Stimulator or Paresthesia:  Response: biceps flexion, 0.6 mA,   Additional Responses:   Narrative:  Start time: 12/04/2020 9:55 AM End time: 12/04/2020 9:59 AM Injection made incrementally with aspirations every 5 mL.  Performed by: Personally  Anesthesiologist: Yves Dill, MD  Additional Notes: Functioning IV was confirmed and monitors were applied.  A 67mm 22ga Stimuplex needle was used. Sterile prep and drape,hand hygiene and sterile gloves were used.  Negative aspiration and negative test dose prior to incremental administration of local anesthetic. The patient tolerated the procedure well.  Time out was called and no pain on injection with easy injection.  Good spread around the nerves.  Prior skin wheal made with 1% Lidocaine plain.

## 2020-12-04 NOTE — H&P (Signed)
History of Present Illness: Carrie Glenn is a 80 y.o. female who presents today for evaluation of a right proximal humerus fracture. The patient initially suffered a fall on 11/02/2020. X-rays of the right shoulder did demonstrate a displaced surgical neck fracture of the right proximal humerus however due to the patient's underlying dementia and age it was decided to treat the patient nonsurgically. The patient was discharged to rehab facility however the patient was quickly removed from the rehab facility when family discovered that the conditions were not optimal for the patient. The patient was then readmitted to the hospital for dehydration where she stayed for several days and then discharged home and has been at home with family. The patient has been undergoing PT and OT at this time primarily walking, the family has been lifting the patient and moving her, they deny any increased motion through left shoulder. The patient does have a history of dementia and does not give me a pain score at today's visit. Family states that she has been complaining of moderate pain to the left upper extremity. Their concern is a deformity which is noted to the left shoulder at today's visit. The patient presents today in a wheelchair, family states that they did notice his deformity initially after the injury but states that it has worsened over the past week. The family denies any history of heart attack, stroke, asthma or COPD for the patient. The patient does have history of chronic kidney disease and underlying dementia.  Past Medical History: . Anemia 08/14/2019  . Anxiety 1960  . CAD (coronary artery disease) - minimal  . Chronic back pain  . CKD (chronic kidney disease) stage 3, GFR 30-59 ml/min (CMS-HCC)  . Complete rupture of rotator cuff  . Deafness  . Dementia (CMS-HCC) 2017  . Depression  . Dermatophytosis of nail  . Diabetes mellitus type 2, uncomplicated (CMS-HCC)  . Disorders of bursae and tendons  in shoulder region, unspecified  . DJD (degenerative joint disease)  . GERD (gastroesophageal reflux disease) 1990  . Hiatal hernia 2007  . Hyperlipidemia  . Hypertension  . Irritable bowel syndrome (IBS)  . Legally blind  . Lumbago  . Lumbar radiculitis  . 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 Other hammer toe (acquired)  . Parkinson disease (CMS-HCC) 2017  . Retinitis pigmentosa (legally blind)  . Type 2 diabetes mellitus with stage 3 chronic kidney disease, without long-term current use of insulin (CMS-HCC)   Past Surgical History: . AMPUTATION TOE Left 2011 - 4th left toe  . Arthroscopic subacromial decompression of the left shoulder plus debridement of the chondral lesions 10/30/2010  . CATARACT EXTRACTION  . COLONOSCOPY 09/08/2007  06/26/2002 (multiple non-bleeding colonic angioectasias).  . EGD 04/20/2012 - 03/20/1993 (ulcerative esophagitis). Esophagus dilated, hiatus hernia, no repeat per Dr. Bluford Kaufmann.  . Laser surgery for diabetic retinopathy  . OSTEOTOMY TOE Left - 2nd & 3rd toes- Hammertoe  . Surgery for DJD of C Spine   Past Family History: . Diabetes type II Mother  . Stroke Mother  . Brain hemorrhage Mother  . Emphysema Father   Medications: . aspirin 81 MG EC tablet Take 81 mg by mouth once daily.  . calcium carbonate-vitamin D3 (CALTRATE 600+D) 600 mg(1,500mg ) -400 unit tablet Take 1 tablet by mouth once daily.  . carbidopa-levodopa (SINEMET CR) 25-100 mg CR tablet Take 1 tablet by mouth 2 (two) times daily for 90 days 180 tablet 3  . cholecalciferol (VITAMIN D3) 2,000 unit capsule Take 2,000  Units by mouth once daily.  . cyclobenzaprine (FLEXERIL) 5 MG tablet Take 1 tablet (5 mg total) by mouth nightly as needed for Muscle spasms 90 tablet 3  . docusate (COLACE) 100 MG capsule Take 100 mg by mouth 2 (two) times daily.  . felodipine (PLENDIL) 5 MG ER tablet Take 1 tablet (5 mg total) by mouth once daily 90 tablet 3  . fluticasone propionate (FLONASE) 50  mcg/actuation nasal spray Place 1 spray into both nostrils 2 (two) times daily 48 g 1  . FOLIC ACID/MULTIVIT-MIN/LUTEIN (CENTRUM SILVER ORAL) Take 1 tablet by mouth once daily.  Marland Kitchen gabapentin (NEURONTIN) 100 MG capsule Take 1 capsule (100 mg total) by mouth nightly 90 capsule 3  . hydrocortisone 1 % cream Apply topically 2 (two) times daily 30 g 0  . iron polysaccharides (FERREX) 150 mg iron capsule Take 1 capsule (150 mg total) by mouth once daily 30 capsule 5  . memantine (NAMENDA) 10 MG tablet Take 1 tablet (10 mg total) by mouth 2 (two) times daily 180 tablet 1  . mirtazapine (REMERON) 7.5 MG tablet Take 1 tablet (7.5 mg total) by mouth once daily 30 tablet 5  . naloxone (NARCAN) 4 mg/actuation nasal spray Place 1 spray (4 mg total) into one nostril once as needed (if not breathing or overdose is suspected.) for up to 1 dose Give 2nd dose in 5-10 min if not responding or if sx return for up to 1 dose. 2 each 1  . nut.tx.gluc.intol,lac-free,soy (GLUCERNA ADVANCE) Liqd Take 237 mLs by mouth 3 (three) times daily 90 Bottle 11  . olmesartan (BENICAR) 20 MG tablet Take 1 tablet (20 mg total) by mouth 2 (two) times daily for 20 days 40 tablet 0  . oxybutynin (DITROPAN-XL) 10 MG XL tablet Take 1 tablet (10 mg total) by mouth once daily 90 tablet 1  . rosuvastatin (CRESTOR) 5 MG tablet Take 1 tablet (5 mg total) by mouth once daily 90 tablet 1  . sertraline (ZOLOFT) 100 MG tablet Take 1 tablet (100 mg total) by mouth once daily 90 tablet 1  . SITagliptin (JANUVIA) 25 MG tablet Take 1 tablet (25 mg total) by mouth once daily 90 tablet 3  . spironolactone (ALDACTONE) 50 MG tablet Take 1 tablet (50 mg total) by mouth 2 (two) times daily 180 tablet 1  . ALPRAZolam (XANAX) 0.5 MG tablet Take 1 tablet (0.5 mg total) by mouth 2 (two) times daily as needed for Sleep for up to 30 days 60 tablet 3  . [START ON 12/13/2020] HYDROcodone-acetaminophen (NORCO) 7.5-325 mg tablet TAKE ONE TO ONE AND ONE-HALF TABLETS BY  MOUTH TWICE A DAY AS NEEDED 90 tablet 0  . [START ON 01/12/2021] HYDROcodone-acetaminophen (NORCO) 7.5-325 mg tablet TAKE ONE TO ONE AND ONE-HALF TABLETS BY MOUTH TWICE A DAY AS NEEDED 90 tablet 0  . [START ON 02/11/2021] HYDROcodone-acetaminophen (NORCO) 7.5-325 mg tablet TAKE ONE TO ONE AND ONE-HALF TABLETS BY MOUTH TWICE A DAY AS NEEDED 90 tablet 0   Allergies: . Amoxicillin - Diarrhea, Nausea, Rash and Vomiting  . Azithromycin - Rash  . Effexor [Venlafaxine] - Rash  . Erythromycin - Nausea  . Lexapro [Escitalopram Oxalate] - Nausea  . Lipitor [Atorvastatin] - Other (GI distress)  . Macrodantin [Nitrofurantoin Macrocrystalline] - Nausea  . Metformin - Itching  . Sertraline - Other (Jittery)  . Trazodone - Other (insomnia)   Review of Systems:  A comprehensive 14 point ROS was performed, reviewed by me today, and the pertinent orthopaedic  findings are documented in the HPI.  Physical Exam: BP 101/63  Ht 157.5 cm (5' 2.01") Comment: pulled in last known height  BMI 20.28 kg/m  General/Constitutional: The patient appears to be well-nourished, well-developed, and in no acute distress. Neuro/Psych: Confused, poor historian. Eyes: Non-icteric. Pupils are equal, round, and reactive to light, and exhibit synchronous movement. ENT: Unremarkable. Lymphatic: No palpable adenopathy. Respiratory: Lungs clear to auscultation, Normal chest excursion, No wheezes and Non-labored breathing Cardiovascular: Regular rate and rhythm. No murmurs. and No edema, swelling or tenderness, except as noted in detailed exam. Integumentary: No impressive skin lesions present, except as noted in detailed exam. Musculoskeletal: Unremarkable, except as noted in detailed exam.  The patient presents today in a wheelchair. See examination of the left upper extremity does reveal moderate ecchymosis still present to the lateral aspect deltoid and extending down to the left hand. The patient is able to wiggle her fingers  of left hand, gentle motion can be obtained through the left elbow. Skin examination left shoulder reveals a significant deformity along the anterior aspect of the shoulder. The proximal humeral shaft is close to breaking through the skin however no opening of the skin can be visualized. Significant skin tenting is noted. Palpation of this area does reveal a jagged edge along the proximal humeral shaft. The patient does have tenderness to palpation. She is intact light touch her left upper extremity. Cap refills intact individual digit. Radial pulses intact left wrist.  Imaging: AP and Y scapular views of the left shoulder were obtained today in the office and reviewed by me. These x-rays do demonstrate a significantly displaced left proximal humerus fracture. The humeral head is located concentrically within the glenohumeral compartment however the proximal humeral shaft is located anteriorly to the humeral head and noticeable skin tenting can be visualized. There does appear to be what appears to be minimal callus formation on AP view of the left shoulder. No other acute fractures visualized.  Impression: Closed displaced fracture of proximal end of left humerus.  Plan:  1. Treatment options were discussed today with the patient. 2. Had a long discussion with the patient's daughter at today's visit. 3. Although the patient does have underlying dementia, in any other situation would not recommend surgery; however there is significant concern for this developing into an open fracture. 4. After discussing the risk and benefits of surgery, plan will be to proceed with a reduction and possible percutaneous pinning of left proximal humerus shaft fracture versus a left reverse total shoulder arthroplasty. 5. This document will serve as a surgical history and physical for the patient. The patient will be scheduled for surgery this Thursday. 6. Plan will be to resume home health services after surgery.  Instructed the patient's daughter on the importance of keeping her arm at her side and not putting any weight through the left upper extremity. 7. The patient will follow-up per standard postop protocol. They can call the clinic they have any questions, new symptoms develop or symptoms worsen.  The procedure was discussed with the patient, as were the potential risks (including bleeding, infection, nerve and/or blood vessel injury, persistent or recurrent pain, failure of the reduction, need for further surgery, blood clots, strokes, heart attacks and/or arhythmias, pneumonia,death, etc.) and benefits. The patient states her understanding and wishes to proceed.   H&P reviewed and patient re-examined. No changes.

## 2020-12-05 ENCOUNTER — Encounter: Payer: Self-pay | Admitting: Surgery

## 2020-12-08 LAB — SURGICAL PATHOLOGY

## 2020-12-11 NOTE — Anesthesia Postprocedure Evaluation (Signed)
Anesthesia Post Note  Patient: Carrie Glenn  Procedure(s) Performed: REVERSE LEFT TOTAL SHOULDER ARTHROPLASTY (Left Shoulder)  Patient location during evaluation: PACU Anesthesia Type: General Level of consciousness: awake and alert and oriented Pain management: pain level controlled Vital Signs Assessment: post-procedure vital signs reviewed and stable Respiratory status: spontaneous breathing Cardiovascular status: blood pressure returned to baseline Anesthetic complications: no   No complications documented.   Last Vitals:  Vitals:   12/04/20 1445 12/04/20 1609  BP: 113/74 120/74  Pulse: (!) 104 (!) 109  Resp: (!) 21 20  Temp: (!) 36.1 C   SpO2: 100% 99%    Last Pain:  Vitals:   12/04/20 1609  TempSrc:   PainSc: 0-No pain                 Merri Dimaano

## 2020-12-19 ENCOUNTER — Other Ambulatory Visit: Payer: Medicare Other | Admitting: Nurse Practitioner

## 2020-12-23 ENCOUNTER — Telehealth: Payer: Self-pay | Admitting: Nurse Practitioner

## 2020-12-23 NOTE — Telephone Encounter (Signed)
Spoke with daughter, [name], to see if we could change the time of the 01/01/21 Palliative f/u visit from 10 to 9 AM and she was in agreement with this.

## 2021-01-01 ENCOUNTER — Other Ambulatory Visit: Payer: Self-pay

## 2021-01-01 ENCOUNTER — Other Ambulatory Visit: Payer: Medicare Other | Admitting: Nurse Practitioner

## 2021-01-01 ENCOUNTER — Telehealth: Payer: Self-pay | Admitting: Nurse Practitioner

## 2021-01-01 NOTE — Telephone Encounter (Signed)
I called Terri, Ms. Petti daughter to confirm PC visit and covid screening. Terri endorses she forgot about visit, wished to reschedule. Rescheduled per request, contact information provided.

## 2021-01-20 ENCOUNTER — Encounter: Payer: Self-pay | Admitting: Nurse Practitioner

## 2021-01-20 ENCOUNTER — Other Ambulatory Visit: Payer: Medicare Other | Admitting: Nurse Practitioner

## 2021-01-20 ENCOUNTER — Other Ambulatory Visit: Payer: Self-pay

## 2021-01-20 DIAGNOSIS — E43 Unspecified severe protein-calorie malnutrition: Secondary | ICD-10-CM

## 2021-01-20 DIAGNOSIS — Z515 Encounter for palliative care: Secondary | ICD-10-CM

## 2021-01-20 NOTE — Progress Notes (Signed)
[facility] Pall[facility] Care Consult Note Telephone: [phone]  Fax: [phone]    Date of encounter: 01/20/21 PATIENT NAME: Carrie Glenn [address] [id number]   [phone] (home)  DOB: May 27, 1941 MRN: [id number] PRIMARY CARE PROVIDER:    Tracie Harrier, MD,  [address] [place] [address] [phone] RESPONSIBLE PARTY:    Contact Information    Name Relation Home Work [place] Daughter [phone]  [phone]   Jennet Maduro   [phone]     I met face to face with patient and family in home. Palliative Care was asked to follow this patient by consultation request of  Tracie Harrier, MD to address advance care planning and complex medical decision making. This is a follow up visit  ASSESSMENT AND PLAN / RECOMMENDATIONS:   Advance Care Planning/Goals of Care: Goals include to maximize quality of life and symptom management. Our advance care planning conversation included a discussion about:     The value and importance of advance care planning   Experiences with loved ones who have been seriously ill or have died   Exploration of personal, cultural or spiritual beliefs that might influence medical decisions   Exploration of goals of care in the event of a sudden injury or illness   Identification and preparation of a healthcare agent   Review and updating or creation of an  advance directive document .  Decision not to resuscitate or to de-escalate disease focused treatments due to poor prognosis.  CODE STATUS: DNR  Symptom Management/Plan: 1.Advance Care Planning;DNR; placed in [facility]  2. Anorexia; protein calorie malnutrition, continue to encourage to eat despite weight loss, muscle wasting; atrophy; progressing in disease process of dementia, parkinson. Discussed have Hospice physicians evaluate case for eligibility and if eligible will  proceed with Hospice   3.Palliative care encounter; Palliative care encounter; Palliative medicine team will continue to support patient, patient's family, and medical team. Visit consisted of counseling and education dealing with the complex and emotionally intense issues of symptom management and palliative care in the setting of serious and potentially life-threatening illness  Follow up Palliative Care Visit: Palliative care will continue to follow for complex medical decision making, advance care planning, and clarification of goals.   I spent 60 minutes providing this consultation. More than 50% of the time in this consultation was spent in counseling and care coordination  PPS: 30% HOSPICE ELIGIBILITY/DIAGNOSIS: TBD  Chief Complaint: Palliative follow up consult for complex medical decision making  HISTORY OF PRESENT ILLNESS:  Carrie Glenn is a 80 y.o. year old female  with Dementia with Parkinson's disease, diabetes, chronic kidney disease, hypertension, dysphasia, arthritis, legally blind, chronic back pain, degenerative disc disease, lumbar, gerd, hard of hearing, osteoporosis. Hospitalized 2 / 6 / 2022 to 2 / 8 / 2022 for a fall with left humerus fracture. Hospitalized 2/8 / 2022 to 2 / 16 / 2022 for acute encephalopathy likely from polypharmacy, delirium with extensive workup which were unrevealing. 12/04/2020 had a eft reverse total shoulder arthroplasty. I called [name], [name] to confirm f/u pc visit and covid screening which was negative. I came to [place] [facility] home where [name] is currently residing. Last PC visit 8 weeks ago [name] was able to stand and pivot. Now [name] is a total lift for transfers. [name] requires to be bathed, dressed. [name] is now incontinence. [name] has been working with OT  with poor progression. [name] does feed herself but takes >45 minutes with ongoing weight loss. Visually [name] appears to have lost 10 to 15lbs,  lost 2 inches in arm/leg circumference with muscle wasting; atrophy; temporal wasting. We talked about [name] is having increase in behaviors, not acting like herself, delusional with verbal statements. More agitated, confused. We talked about medical goals of care. [name] wishes to continue to keep [name] at her home to care for her. We talked about disease progression of dementia, parkinson in the setting of h/o covid, falls, recent sgy of shoulder and poor prognosis with recovery. We talked about [name] being at new baseline and appears to be progressing to end of her life with clinical presentation, delusions, significant decline, weight loss. We talked about role PC in poc. We talked about option of Hospice benefit under Medicare program. We talked about services provided. [name] in agreement to have Hospice Physicians to review case. We talked about quality of life, suffering. We talked about Hospice philosophy. We talked about caregiver stress, fatigue, burnout with coping strategies. Therapeutic listening, emotional support provided. Discussed with [name], once Hospice Physicians review case will re-contact. Terry in agreement.  Left arm 18cm Left leg 28cm  History obtained from review of EMR, discussion with  and interview with daughter and  [name].  I reviewed available labs, medications, imaging, studies and related documents from the EMR.  Records reviewed and summarized above.   ROS  Full 14 system review of systems performed and negative with exception of: as per HPI.  Physical Exam: Constitutional: NAD General: frail appearing, thin, severely debilitated, confused, chronically ill female, pale EYES:  lids intact ENMT: oral mucous membranes moist CV: S1S2, RRR, no LE edema Pulmonary: LCTA, no increased work of breathing, room air Abdomen:  normo-active BS + 4 quadrants, soft and non tender, no ascites GU: deferred MSK: , lift for mobility; muscle wasting Skin: warm and  dry, no rashes or wounds on visible skin Neuro:  severe generalized weakness,  moderate cognitive impairment Psych: flat affect, Alert, confused  Questions and concerns were addressed. The patient/daughter was encouraged to call with questions and/or concerns.  Provided general support and encouragement, no other unmet needs identified  Thank you for the opportunity to participate in the care of Ms. Petit.  The palliative care team will continue to follow. Please call our office at [phone] if we can be of additional assistance.   This chart was dictated using voice recognition software. Despite best efforts to proofread, errors can occur which can change the documentation meaning.  Tyniah Kastens Z Porfiria Heinrich, NP , MSN, ACHPN  COVID-19 PATIENT SCREENING TOOL Asked and negative response unless otherwise noted:   Have you had symptoms of covid, tested positive or been in contact with someone with symptoms/positive test in the past 5-10 days? NO

## 2021-01-23 ENCOUNTER — Telehealth: Payer: Self-pay | Admitting: Nurse Practitioner

## 2021-01-23 NOTE — Telephone Encounter (Signed)
Spoke with daughter, [name], and have scheduled a Palliative f/u visit for 02/06/21 @ 3 PM

## 2021-01-29 ENCOUNTER — Telehealth: Payer: Self-pay

## 2021-01-29 NOTE — Telephone Encounter (Signed)
SW completed a follow-up call to patient's daughter-Carrie Glenn to provide support and education. SW provided education on the benefits of hospice, which Carrie Glenn was was very open and receptive. She would like for SW to give her a call back tomorrow morning after she had an opportunity to talk with her son. Carrie Glenn was released back home today with more medication, but she is concerned for herself now that her health is also declining and the ongoing caregiving responsibilities for patient. Carrie Glenn expressed tthat she wished hospice was explained to her yesterday like it was today, because she would have moved forward yesterday.  SW validated her concerns regarding losing her current services to accept hospice care, while acknowledging the rapid pace in which her mother has declined. SW educated her on what hospice could offer verses the 2 hours, three days a week she is getting from her current service provider. SW  stressed the availability of 24 hour support, the RN, Aide services, equipment and supplies that are also available and covered under hospice. Carrie Glenn is also considering placing her mom, so she was  advised on how the hospice SW could also assist her with placement. Carrie Glenn stressed she needs a break from caregiving and a few days to recuperate and get focused on next steps as she is beyond burnout! SW provided reassurance of support and advised her that she will follow-up with her in the morning for further support and next steps.   *NP and Palliative Administrative Supervisor updated for follow-up.

## 2021-02-02 ENCOUNTER — Telehealth: Payer: Self-pay

## 2021-02-02 ENCOUNTER — Telehealth: Payer: Self-pay | Admitting: Nurse Practitioner

## 2021-02-02 NOTE — Telephone Encounter (Signed)
(  12:31p) SW received call from patient's daughter-Terri. She stated that she would like to move forward with getting her mother on hospice. Terri stated that appreciated the SW explaining the services to her and helping her see how the services could help her mom. Terri report that she has a follow-up appointment with her physician today as her blood pressure remains high. She is expecting to possibly be hospitalized as her blood pressure issues persist. [name] also requested that her mother go into respite on Thursday as well. SW advised her that she will forward this to her supervisor for follow-up. SW advised that the team will work to get her mother on services as soon as possible.  Terri verbalized appreciation and understanding.   *Palliative care administrative supervisor updated for follow-up.

## 2021-02-02 NOTE — Telephone Encounter (Signed)
I called Dr Marcello Fennel office in attempts to obtain a Hospice order as daughter, Aurther Loft notified Palliative of wishes to proceed with Hospice. Message left with contact information to send order to [facility]

## 2021-02-06 ENCOUNTER — Other Ambulatory Visit: Payer: Medicare Other | Admitting: Nurse Practitioner

## 2021-08-27 DEATH — deceased
